# Patient Record
Sex: Female | Born: 1975 | Race: Black or African American | Hispanic: No | Smoking: Former smoker
Health system: Southern US, Community
[De-identification: ages and names within clinical notes are randomized; demographics above are authoritative.]

## PROBLEM LIST (undated history)

## (undated) DIAGNOSIS — N289 Disorder of kidney and ureter, unspecified: Secondary | ICD-10-CM

## (undated) DIAGNOSIS — I10 Essential (primary) hypertension: Secondary | ICD-10-CM

## (undated) DIAGNOSIS — K769 Liver disease, unspecified: Secondary | ICD-10-CM

## (undated) DIAGNOSIS — G629 Polyneuropathy, unspecified: Secondary | ICD-10-CM

## (undated) HISTORY — DX: Liver disease, unspecified: K76.9

## (undated) HISTORY — DX: Disorder of kidney and ureter, unspecified: N28.9

## (undated) HISTORY — PX: TOE SURGERY: SHX1073

---

## 1998-11-10 ENCOUNTER — Encounter: Admission: RE | Admit: 1998-11-10 | Discharge: 1998-11-10 | Payer: Self-pay | Admitting: Obstetrics

## 1998-11-15 ENCOUNTER — Encounter: Admission: RE | Admit: 1998-11-15 | Discharge: 1999-02-13 | Payer: Self-pay | Admitting: Obstetrics & Gynecology

## 1998-11-16 ENCOUNTER — Encounter: Admission: RE | Admit: 1998-11-16 | Discharge: 1998-11-16 | Payer: Self-pay | Admitting: Obstetrics & Gynecology

## 1998-11-16 ENCOUNTER — Other Ambulatory Visit: Admission: RE | Admit: 1998-11-16 | Discharge: 1998-11-16 | Payer: Self-pay | Admitting: Obstetrics & Gynecology

## 1998-11-18 ENCOUNTER — Ambulatory Visit (HOSPITAL_COMMUNITY): Admission: RE | Admit: 1998-11-18 | Discharge: 1998-11-18 | Payer: Self-pay | Admitting: *Deleted

## 1998-11-19 ENCOUNTER — Encounter: Payer: Self-pay | Admitting: Obstetrics & Gynecology

## 1998-11-19 ENCOUNTER — Inpatient Hospital Stay (HOSPITAL_COMMUNITY): Admission: AD | Admit: 1998-11-19 | Discharge: 1998-11-22 | Payer: Self-pay | Admitting: *Deleted

## 1998-11-25 ENCOUNTER — Inpatient Hospital Stay (HOSPITAL_COMMUNITY): Admission: AD | Admit: 1998-11-25 | Discharge: 1998-11-28 | Payer: Self-pay | Admitting: Obstetrics

## 1998-12-21 ENCOUNTER — Encounter: Admission: RE | Admit: 1998-12-21 | Discharge: 1998-12-21 | Payer: Self-pay | Admitting: Obstetrics & Gynecology

## 1998-12-28 ENCOUNTER — Encounter: Admission: RE | Admit: 1998-12-28 | Discharge: 1998-12-28 | Payer: Self-pay | Admitting: Obstetrics & Gynecology

## 1999-01-11 ENCOUNTER — Encounter: Admission: RE | Admit: 1999-01-11 | Discharge: 1999-01-11 | Payer: Self-pay | Admitting: Obstetrics & Gynecology

## 1999-01-18 ENCOUNTER — Encounter: Admission: RE | Admit: 1999-01-18 | Discharge: 1999-01-18 | Payer: Self-pay | Admitting: Obstetrics & Gynecology

## 1999-01-25 ENCOUNTER — Encounter: Admission: RE | Admit: 1999-01-25 | Discharge: 1999-01-25 | Payer: Self-pay | Admitting: Obstetrics & Gynecology

## 1999-01-26 ENCOUNTER — Ambulatory Visit (HOSPITAL_COMMUNITY): Admission: RE | Admit: 1999-01-26 | Discharge: 1999-01-26 | Payer: Self-pay | Admitting: Obstetrics & Gynecology

## 2002-04-07 ENCOUNTER — Emergency Department (HOSPITAL_COMMUNITY): Admission: EM | Admit: 2002-04-07 | Discharge: 2002-04-07 | Payer: Self-pay | Admitting: *Deleted

## 2004-11-09 ENCOUNTER — Emergency Department (HOSPITAL_COMMUNITY): Admission: EM | Admit: 2004-11-09 | Discharge: 2004-11-09 | Payer: Self-pay | Admitting: Emergency Medicine

## 2005-07-20 ENCOUNTER — Emergency Department (HOSPITAL_COMMUNITY): Admission: EM | Admit: 2005-07-20 | Discharge: 2005-07-20 | Payer: Self-pay | Admitting: Emergency Medicine

## 2006-12-25 ENCOUNTER — Inpatient Hospital Stay (HOSPITAL_COMMUNITY): Admission: EM | Admit: 2006-12-25 | Discharge: 2006-12-28 | Payer: Self-pay | Admitting: Emergency Medicine

## 2007-01-21 ENCOUNTER — Emergency Department (HOSPITAL_COMMUNITY): Admission: EM | Admit: 2007-01-21 | Discharge: 2007-01-21 | Payer: Self-pay | Admitting: Emergency Medicine

## 2008-11-26 ENCOUNTER — Emergency Department (HOSPITAL_COMMUNITY): Admission: EM | Admit: 2008-11-26 | Discharge: 2008-11-26 | Payer: Self-pay | Admitting: Emergency Medicine

## 2009-07-10 ENCOUNTER — Emergency Department (HOSPITAL_COMMUNITY): Admission: EM | Admit: 2009-07-10 | Discharge: 2009-07-10 | Payer: Self-pay | Admitting: Emergency Medicine

## 2010-04-08 ENCOUNTER — Emergency Department (HOSPITAL_COMMUNITY)
Admission: EM | Admit: 2010-04-08 | Discharge: 2010-04-09 | Payer: Self-pay | Source: Home / Self Care | Admitting: Emergency Medicine

## 2010-08-23 LAB — CBC
Hemoglobin: 11.4 g/dL — ABNORMAL LOW (ref 12.0–15.0)
MCH: 28.5 pg (ref 26.0–34.0)
MCHC: 33.9 g/dL (ref 30.0–36.0)
MCV: 84 fL (ref 78.0–100.0)
RBC: 4 MIL/uL (ref 3.87–5.11)

## 2010-08-23 LAB — URINALYSIS, ROUTINE W REFLEX MICROSCOPIC
Glucose, UA: >1000 mg/dL — AB
Specific Gravity, Urine: 1.025 (ref 1.005–1.030)
Urobilinogen, UA: 1 mg/dL (ref 0.0–1.0)

## 2010-08-23 LAB — DIFFERENTIAL
Basophils Relative: 0 % (ref 0–1)
Eosinophils Absolute: 0.2 10*3/uL (ref 0.0–0.7)
Eosinophils Relative: 2 % (ref 0–5)
Lymphs Abs: 1.7 10*3/uL (ref 0.7–4.0)
Monocytes Relative: 5 % (ref 3–12)
Neutrophils Relative %: 75 % (ref 43–77)

## 2010-08-23 LAB — GLUCOSE, CAPILLARY: Glucose-Capillary: 470 mg/dL — ABNORMAL HIGH (ref 70–99)

## 2010-08-23 LAB — POCT I-STAT, CHEM 8
Calcium, Ion: 1.14 mmol/L (ref 1.12–1.32)
Creatinine, Ser: 0.9 mg/dL (ref 0.4–1.2)
Glucose, Bld: 514 mg/dL — ABNORMAL HIGH (ref 70–99)
HCT: 38 % (ref 36.0–46.0)
Hemoglobin: 12.9 g/dL (ref 12.0–15.0)
Potassium: 4.6 mEq/L (ref 3.5–5.1)
TCO2: 28 mmol/L (ref 0–100)

## 2010-08-23 LAB — URINE MICROSCOPIC-ADD ON

## 2010-08-27 LAB — URINALYSIS, ROUTINE W REFLEX MICROSCOPIC
Bilirubin Urine: NEGATIVE
Glucose, UA: >1000 mg/dL — AB
Nitrite: POSITIVE — AB
Specific Gravity, Urine: 1.025 (ref 1.005–1.030)
pH: 6 (ref 5.0–8.0)

## 2010-08-27 LAB — URINE MICROSCOPIC-ADD ON

## 2010-08-27 LAB — GLUCOSE, CAPILLARY: Glucose-Capillary: 360 mg/dL — ABNORMAL HIGH (ref 70–99)

## 2010-08-27 LAB — URINE CULTURE

## 2010-10-24 NOTE — H&P (Signed)
NAME:  Anne Carney, Anne Carney           ACCOUNT NO.:  [id number]   MEDICAL RECORD NO.:  [id number]          PATIENT TYPE:  INP   LOCATION:  A226                          FACILITY:  [facility]   PHYSICIAN:  Marcello Moores, MD   DATE OF BIRTH:  [birth year]   DATE OF ADMISSION:  12/25/2006  DATE OF DISCHARGE:  LH                              HISTORY & PHYSICAL   PMD:  Unassigned.   CHIEF COMPLAINTS:  Blurred vision, generalized weakness and polyuria.   HISTORY OF PRESENT ILLNESS:  Ms. Anne Carney is a 35 year old female  patient with history of diabetes mellitus and borderline hypertension.  She presents to the emergency room with the above complaint.  As per the  patient, she stated that she ran out of Lantus for the last 1 week and  she did not take her Lantus but she continued to take her metformin, and  she started to have weakness and visual disturbance and tinnitus with  also increased urine frequency.  Associated with that she has associated  anterior lower chest pain, more of epigastric pain as well.  She denied  any fever.  She denied any cough.  She denied any shortness of breath.  She did not have any GI complaints rather than the above pain.  She was  diabetic for the last 15 years and she was following on [facility]  health department.   REVIEW OF SYSTEMS:  A 10-point review of system is noncontributory  except as dictated on the HPI.   ALLERGIES:  She has not any known allergies.   SOCIAL HISTORY:  She has two children and lives with her children and  currently she is working.  Denies smoking and alcohol or drug abuse.   PAST MEDICAL HISTORY:  1. Diabetes mellitus on medication, metformin and Lantus.  2. Borderline hypertension, not on any medication.   HOME MEDICATIONS:  1. Metformin 500 mg twice a day.  2. Lantus 50 units at bedtime.   PHYSICAL EXAMINATION:  The patient is lying in the bed without any  cardiorespiratory distress.  VITAL SIGNS:  Blood pressure was 140/77,  temperature 98 and pulse rate  is 100 and respiratory rate is 20 and saturation is 98%.  HEENT:  Pink conjunctivae.  Nonicteric sclera.  Pupils are equal and  reactive.  Neck is supple.  CHEST:  Good air entry bilaterally.  CVS:  S1-S2 regular, were heard.  ABDOMEN:  Obese, soft.  No area of tenderness.  Normoactive bowel  sounds.  EXTREMITIES:  No pedal edema.  Peripheral pulses positive.  CNS:  She is alert and well-oriented.  There is not any neurological  deficit.   On the labs, hematology:  CBC is 11.6, hemoglobin is 12.9, hematocrit is  38, platelet count is 149.  Lipase 17.  On the chemistry, sodium is 128,  potassium 4.5, bicarb 28, glucose 601.  Urinalysis showed glucose  greater than 1000.  Otherwise it is negative.  Chest x-ray was done,  which was normal.   ASSESSMENT:  Uncontrolled diabetes mellitus secondary to nonadherence to  medications.  The patient was not taking Lantus for the last several  days and after that she developed the signs and symptoms of her  presentation and in the ER the blood sugar was 600, and the patient was  put on insulin drip with Glucommander.  The patient will be admitted to  2-A and will continue on insulin drip and will monitor with Glucommander  and will monitor also her chemistry.  Currently we do not have BUN and  creatinine, and we will follow it.  Tomorrow will send hemoglobin A1c as  well and when her glucose is controlled, we will consider discharging  her with strong advice not to discontinue her medications.  Briefly she  was indicated about the importance of adherence to her medications and  the complication of uncontrolled diabetes mellitus.      Marcello Moores, MD  Electronically Signed     MT/MEDQ  D:  12/25/2006  T:  12/26/2006  Job:  [id number]

## 2010-10-24 NOTE — Discharge Summary (Signed)
NAME:  Anne Carney, Anne Carney NO.:  [id number]   MEDICAL RECORD NO.:  [id number]          PATIENT TYPE:  INP   LOCATION:  A226                          FACILITY:  [facility]   PHYSICIAN:  Marcello Moores, MD   DATE OF BIRTH:  [birth year]   DATE OF ADMISSION:  12/25/2006  DATE OF DISCHARGE:  07/19/2008LH                               DISCHARGE SUMMARY   PRIMARY CARE PHYSICIAN:  She follows in the [facility] Department of  Health.   DISCHARGE DIAGNOSES:  1. Uncontrolled diabetes mellitus secondary to nonadherence, resolved.  2. Borderline hypertension.   HOME MEDICATIONS:  1. Lantus 50 units subcutaneously at p.m.  2. Metformin 500 mg twice a day.   HOSPITAL COURSE:  [name] is a 35 year old female patient with  history of diabetes mellitus, type 1, on insulin and metformin for  several years. She came with generalized weakness and polyuria with  blurred vision to the emergency room after she was not taking insulin  for 1 week because she ran out of it, and she could not refill it.  Her  blood sugar level in the emergency room was in the 600s, and she was  admitted with insulin drip to the floor for better control.  Currently,  her blood sugar is fairly controlled, and she is ready to go home.   Today, her vital signs show blood pressure is 116/70, temperature 98,  and pulse 81, respiratory rate 20. She is saturating 99% on room air.  Her CBG was 241, 91, 206.  Her blood glucose level in the chemistry  today was 77.  Otherwise, her hematology as well as her chemistry is  within normal range.  Sodium 140, potassium 4, chloride 104, bicarb 30,  glucose 77, BUN 4, creatinine 0.6, and calcium is 8.5.  Her cardiac  enzymes were within normal range, and she is stable.   She is ready to go home, and she was advised to have followup in 1 week  with her primary care physician at [facility]..  A prescription for Lantus was given for 1 ampule, and it  was arranged by  the case manager to get her medication from the pharmacy.      Marcello Moores, MD  Electronically Signed     MT/MEDQ  D:  12/28/2006  T:  12/29/2006  Job:  [id number]

## 2010-10-27 ENCOUNTER — Emergency Department (HOSPITAL_COMMUNITY)
Admission: EM | Admit: 2010-10-27 | Discharge: 2010-10-27 | Disposition: A | Discharge status: Home / Self Care | Payer: PRIVATE HEALTH INSURANCE | Attending: Emergency Medicine | Admitting: Emergency Medicine

## 2010-10-27 DIAGNOSIS — W57XXXA Bitten or stung by nonvenomous insect and other nonvenomous arthropods, initial encounter: Secondary | ICD-10-CM | POA: Insufficient documentation

## 2010-10-27 DIAGNOSIS — S40269A Insect bite (nonvenomous) of unspecified shoulder, initial encounter: Secondary | ICD-10-CM | POA: Insufficient documentation

## 2010-10-27 DIAGNOSIS — I10 Essential (primary) hypertension: Secondary | ICD-10-CM | POA: Insufficient documentation

## 2010-10-27 DIAGNOSIS — Z79899 Other long term (current) drug therapy: Secondary | ICD-10-CM | POA: Insufficient documentation

## 2010-10-27 DIAGNOSIS — E119 Type 2 diabetes mellitus without complications: Secondary | ICD-10-CM | POA: Insufficient documentation

## 2011-03-26 LAB — URINE CULTURE: Colony Count: >=100000

## 2011-03-26 LAB — CBC
HCT: 38.1
Hemoglobin: 11.9 — ABNORMAL LOW
Hemoglobin: 12.6
Hemoglobin: 12.9
MCHC: 33.8
MCHC: 34.3
MCHC: 35
MCV: 83.7
MCV: 85
Platelets: 121 — ABNORMAL LOW
Platelets: 149 — ABNORMAL LOW
RBC: 4.07
RBC: 4.48
RDW: 13.8
RDW: 13.9
RDW: 14
WBC: 11.6 — ABNORMAL HIGH

## 2011-03-26 LAB — BASIC METABOLIC PANEL
BUN: 3 — ABNORMAL LOW
CO2: 26
CO2: 30
Calcium: 8.4
Calcium: 8.5
Chloride: 101
Chloride: 107
Creatinine, Ser: 0.69
GFR calc Af Amer: >60
GFR calc Af Amer: >60
GFR calc non Af Amer: >60
Glucose, Bld: 77
Potassium: 3.7
Sodium: 140
Sodium: 141

## 2011-03-26 LAB — DIFFERENTIAL
Basophils Absolute: 0
Basophils Absolute: 0
Basophils Relative: 0
Basophils Relative: 0
Basophils Relative: 0
Eosinophils Absolute: 0
Eosinophils Absolute: 0.1
Eosinophils Relative: 0
Lymphocytes Relative: 26
Lymphocytes Relative: 3 — ABNORMAL LOW
Lymphs Abs: 0.3 — ABNORMAL LOW
Monocytes Absolute: 0.2
Monocytes Absolute: 0.3
Monocytes Absolute: 0.5
Monocytes Relative: 2 — ABNORMAL LOW
Monocytes Relative: 6
Monocytes Relative: 7
Neutro Abs: 11.1 — ABNORMAL HIGH
Neutro Abs: 3.4
Neutro Abs: 4.4
Neutrophils Relative %: 74
Neutrophils Relative %: 96 — ABNORMAL HIGH

## 2011-03-26 LAB — COMPREHENSIVE METABOLIC PANEL WITH GFR
BUN: 10
CO2: 28
Calcium: 9.1
Creatinine, Ser: 0.95
GFR calc Af Amer: >60
GFR calc non Af Amer: >60
Total Protein: 6.9

## 2011-03-26 LAB — COMPREHENSIVE METABOLIC PANEL
ALT: 21
AST: 25
Albumin: 3.4 — ABNORMAL LOW
Alkaline Phosphatase: 136 — ABNORMAL HIGH
Chloride: 92 — ABNORMAL LOW
Glucose, Bld: 601
Potassium: 4.5
Sodium: 128 — ABNORMAL LOW
Total Bilirubin: 0.8

## 2011-03-26 LAB — URINALYSIS, ROUTINE W REFLEX MICROSCOPIC
Glucose, UA: >1000 — AB
Hgb urine dipstick: NEGATIVE
Leukocytes, UA: NEGATIVE
Protein, ur: NEGATIVE
Specific Gravity, Urine: <1.005 — ABNORMAL LOW

## 2011-03-26 LAB — CARDIAC PANEL(CRET KIN+CKTOT+MB+TROPI)
CK, MB: 0.7
Relative Index: INVALID
Total CK: 126
Troponin I: 0.03
Troponin I: 0.03

## 2011-03-26 LAB — URINE MICROSCOPIC-ADD ON

## 2011-03-26 LAB — LIPASE, BLOOD: Lipase: 17

## 2011-04-27 ENCOUNTER — Emergency Department (HOSPITAL_COMMUNITY)
Admission: EM | Admit: 2011-04-27 | Discharge: 2011-04-27 | Disposition: A | Discharge status: Home / Self Care | Payer: PRIVATE HEALTH INSURANCE | Attending: Emergency Medicine | Admitting: Emergency Medicine

## 2011-04-27 DIAGNOSIS — N390 Urinary tract infection, site not specified: Secondary | ICD-10-CM

## 2011-04-27 DIAGNOSIS — E119 Type 2 diabetes mellitus without complications: Secondary | ICD-10-CM | POA: Insufficient documentation

## 2011-04-27 DIAGNOSIS — I10 Essential (primary) hypertension: Secondary | ICD-10-CM | POA: Insufficient documentation

## 2011-04-27 DIAGNOSIS — Z794 Long term (current) use of insulin: Secondary | ICD-10-CM | POA: Insufficient documentation

## 2011-04-27 HISTORY — DX: Essential (primary) hypertension: I10

## 2011-04-27 LAB — URINALYSIS, ROUTINE W REFLEX MICROSCOPIC
Bilirubin Urine: NEGATIVE
Nitrite: POSITIVE — AB
Specific Gravity, Urine: 1.025 (ref 1.005–1.030)
pH: 6 (ref 5.0–8.0)

## 2011-04-27 LAB — PREGNANCY, URINE: Preg Test, Ur: NEGATIVE

## 2011-04-27 LAB — URINE MICROSCOPIC-ADD ON

## 2011-04-27 MED ORDER — NITROFURANTOIN MONOHYD MACRO 100 MG PO CAPS
100.0000 mg | ORAL_CAPSULE | Freq: Once | ORAL | Status: AC
  Administered 2011-04-27: 100 mg via ORAL
  Filled 2011-04-27: qty 1

## 2011-04-27 MED ORDER — NITROFURANTOIN MONOHYD MACRO 100 MG PO CAPS
ORAL_CAPSULE | ORAL | Status: AC
  Filled 2011-04-27: qty 1

## 2011-04-27 MED ORDER — NITROFURANTOIN MONOHYD MACRO 100 MG PO CAPS: 100.0000 mg | ORAL_CAPSULE | Freq: Two times a day (BID) | ORAL | Status: AC

## 2011-04-27 MED ORDER — PHENAZOPYRIDINE HCL 200 MG PO TABS: 200.0000 mg | ORAL_TABLET | Freq: Three times a day (TID) | ORAL | Status: AC

## 2011-04-27 NOTE — ED Notes (Addendum)
Pt presents with pain during urination x 4 days. Pt denies all other urinary symptoms.

## 2011-04-27 NOTE — ED Notes (Signed)
Discharge instructions and teaching given to pt. Awaiting macrobid from [facility] for med admin.

## 2011-04-27 NOTE — ED Provider Notes (Signed)
History     CSN: [id number] Arrival date & time: 04/27/2011  7:34 PM   First MD Initiated Contact with Patient 04/27/11 2057      Chief Complaint  Patient presents with  . Dysuria    (Consider location/radiation/quality/duration/timing/severity/associated sxs/prior treatment) Patient is a 35 y.o. female presenting with dysuria. The history is provided by the patient. No language interpreter was used.  Dysuria  This is a new problem. Episode onset: 4 days ago. The problem occurs every urination. The problem has been gradually worsening. The quality of the pain is described as burning. The pain is moderate. There has been no fever. She is sexually active. Associated symptoms include frequency, urgency and flank pain. Pertinent negatives include no chills, no nausea, no vomiting, no discharge, no hematuria, no hesitancy and no possible pregnancy.    Past Medical History  Diagnosis Date  . Diabetes mellitus   . Hypertension     History reviewed. No pertinent past surgical history.  No family history on file.  History  Substance Use Topics  . Smoking status: Never Smoker   . Smokeless tobacco: Not on file  . Alcohol Use: No    OB History    Grav Para Term Preterm Abortions TAB SAB Ect Mult Living                  Review of Systems  Constitutional: Negative for fever and chills.  Gastrointestinal: Negative for nausea and vomiting.  Genitourinary: Positive for dysuria, urgency, frequency and flank pain. Negative for hesitancy, hematuria, vaginal bleeding, vaginal discharge, menstrual problem and pelvic pain.  All other systems reviewed and are negative.    Allergies  Review of patient's allergies indicates no known allergies.  Home Medications   Current Outpatient Rx  Name Route Sig Dispense Refill  . INSULIN ISOPHANE HUMAN 100 UNIT/ML [place] SUSP Subcutaneous Inject 25-36 Units into the skin 2 (two) times daily. Inject 36 units every morning and 25 units at bedtime       . METFORMIN HCL 500 MG PO TABS Oral Take 500 mg by mouth 2 (two) times daily with a meal.      . PHENAZOPYRIDINE HCL 95 MG PO TABS Oral Take 95 mg by mouth 2 (two) times daily as needed. For potential UTI       BP 147/76  Pulse 104  Temp(Src) 98.5 F (36.9 C) (Oral)  Resp 20  Ht 5\' 4"  (1.626 m)  Wt 205 lb (92.987 kg)  BMI 35.19 kg/m2  SpO2 100%  LMP 03/27/2011  Physical Exam  Vitals reviewed. Constitutional: She is oriented to person, place, and time. She appears well-developed and well-nourished.  HENT:  Head: Normocephalic.  Eyes: EOM are normal.  Neck: Normal range of motion.  Pulmonary/Chest: Effort normal. No respiratory distress.  Abdominal: Soft. Normal appearance. She exhibits no distension. There is tenderness in the suprapubic area. There is no rebound, no guarding and no CVA tenderness.    Musculoskeletal: Normal range of motion.  Neurological: She is alert and oriented to person, place, and time.  Skin: Skin is warm and dry.  Psychiatric: She has a normal mood and affect.    ED Course  Procedures (including critical care time)   Labs Reviewed  PREGNANCY, URINE  URINALYSIS, ROUTINE W REFLEX MICROSCOPIC   No results found.   No diagnosis found.    MDM          Worthy Rancher, PA 04/27/11 2116

## 2011-04-30 LAB — URINE CULTURE

## 2011-05-01 NOTE — ED Notes (Signed)
+   URINE Chart sent to EDP office for review.

## 2011-05-02 NOTE — ED Notes (Signed)
Rx for Ciprofloxacin 250 mg 1 po BID x 3 days #6 written by Rob Hickman need to be called to pharmacy.

## 2011-05-05 NOTE — ED Notes (Signed)
Attempts to contact patient by phone have been unsuccessful. Numbers provided were out of service. Letter sent.

## 2011-06-08 NOTE — ED Notes (Signed)
No response after 30 days . Chart appended and sent to Medical records. 

## 2011-10-14 ENCOUNTER — Emergency Department (HOSPITAL_COMMUNITY): Payer: No Typology Code available for payment source

## 2011-10-14 ENCOUNTER — Encounter (HOSPITAL_COMMUNITY): Payer: Self-pay

## 2011-10-14 ENCOUNTER — Emergency Department (HOSPITAL_COMMUNITY)
Admission: EM | Admit: 2011-10-14 | Discharge: 2011-10-14 | Disposition: A | Discharge status: Home / Self Care | Payer: No Typology Code available for payment source | Attending: Emergency Medicine | Admitting: Emergency Medicine

## 2011-10-14 DIAGNOSIS — R079 Chest pain, unspecified: Secondary | ICD-10-CM | POA: Insufficient documentation

## 2011-10-14 DIAGNOSIS — E119 Type 2 diabetes mellitus without complications: Secondary | ICD-10-CM | POA: Insufficient documentation

## 2011-10-14 DIAGNOSIS — I10 Essential (primary) hypertension: Secondary | ICD-10-CM | POA: Insufficient documentation

## 2011-10-14 LAB — CARDIAC PANEL(CRET KIN+CKTOT+MB+TROPI)
CK, MB: 3.1 ng/mL (ref 0.3–4.0)
Total CK: 322 U/L — ABNORMAL HIGH (ref 7–177)
Troponin I: <0.3 ng/mL (ref <0.30)

## 2011-10-14 LAB — DIFFERENTIAL
Basophils Absolute: 0 10*3/uL (ref 0.0–0.1)
Lymphocytes Relative: 19 % (ref 12–46)
Lymphs Abs: 1.5 10*3/uL (ref 0.7–4.0)
Neutrophils Relative %: 74 % (ref 43–77)

## 2011-10-14 LAB — BASIC METABOLIC PANEL
CO2: 24 mEq/L (ref 19–32)
GFR calc non Af Amer: 88 mL/min — ABNORMAL LOW (ref >90)
Glucose, Bld: 365 mg/dL — ABNORMAL HIGH (ref 70–99)
Potassium: 4.3 mEq/L (ref 3.5–5.1)
Sodium: 133 mEq/L — ABNORMAL LOW (ref 135–145)

## 2011-10-14 LAB — CBC
Platelets: 160 10*3/uL (ref 150–400)
RBC: 4.12 MIL/uL (ref 3.87–5.11)
WBC: 7.5 10*3/uL (ref 4.0–10.5)

## 2011-10-14 MED ORDER — NAPROXEN 375 MG PO TABS: 375.0000 mg | ORAL_TABLET | Freq: Two times a day (BID) | ORAL | Status: DC

## 2011-10-14 MED ORDER — CYCLOBENZAPRINE HCL 10 MG PO TABS: 10.0000 mg | ORAL_TABLET | Freq: Two times a day (BID) | ORAL | Status: AC | PRN

## 2011-10-14 NOTE — ED Notes (Signed)
EDP is in the room with the patient at this time  

## 2011-10-14 NOTE — ED Notes (Signed)
Pt reports left side cp that started yesterday, has been constant, pain worse w/ movement. Sob at times, denies any n/v.

## 2011-10-14 NOTE — ED Provider Notes (Signed)
History   This chart was scribed for Donnetta Hutching, MD by Toya Smothers. The patient was seen in room APA14/APA14. Patient's care was started at 0955.  CSN: [id number]  Arrival date & time 10/14/11  6962  First MD Initiated Contact with Patient 10/14/11 1044     Chief Complaint  Patient presents with  . Chest Pain   The history is provided by the patient. No language interpreter was used.    Anne Carney is a 36 y.o. female who presents to the Emergency Department complaining of gradual onset constant moderate upper left chest pain described as "slight pain" radiating downward left arm to left elbow onset 24 hours ago with associated symptoms of SOB and sweating. Patient has a h/o diabetes diagnosed 22 years ago and hypertension, and a family history of a grandmother having MI and CVA. Patient denies smoking and use of alcohol.  Pt list PCP as Dr Redmond School   Past Medical History  Diagnosis Date  . Diabetes mellitus   . Hypertension    No past surgical history on file.  No family history on file.  History  Substance Use Topics  . Smoking status: Never Smoker   . Smokeless tobacco: Not on file  . Alcohol Use: No   Review of Systems  Constitutional: Negative for fever and chills.  HENT: Negative for rhinorrhea and neck pain.   Eyes: Negative for photophobia and pain.  Respiratory: Positive for shortness of breath. Negative for cough.   Cardiovascular: Negative for chest pain.  Gastrointestinal: Negative for nausea, vomiting, abdominal pain and diarrhea.  Genitourinary: Negative for dysuria.  Musculoskeletal: Negative for back pain.  Skin: Negative for rash.  Neurological: Negative for weakness and headaches.   Allergies  Review of patient's allergies indicates no known allergies.  Home Medications   Current Outpatient Rx  Name Route Sig Dispense Refill  . INSULIN ISOPHANE HUMAN 100 UNIT/ML [place] SUSP Subcutaneous Inject 26-36 Units into the skin 2 (two) times daily. 36  units in the morning and 26 units in the evening    . METFORMIN HCL 500 MG PO TABS Oral Take 500 mg by mouth 2 (two) times daily with a meal.     . PHENAZOPYRIDINE HCL 95 MG PO TABS Oral Take 95 mg by mouth 2 (two) times daily as needed. For potential UTI      BP 144/74  Pulse 87  Temp(Src) 98.5 F (36.9 C) (Oral)  Resp 18  Ht 5\' 4"  (1.626 m)  Wt 210 lb (95.255 kg)  BMI 36.05 kg/m2  SpO2 97%  LMP 09/17/2011  Physical Exam  Nursing note and vitals reviewed. Constitutional: She is oriented to person, place, and time. She appears well-developed and well-nourished.  HENT:  Head: Normocephalic and atraumatic.  Eyes: Conjunctivae and EOM are normal. Pupils are equal, round, and reactive to light.  Neck: Normal range of motion. Neck supple. No tracheal deviation present. No thyromegaly present.  Cardiovascular: Normal rate, regular rhythm and normal heart sounds.  Exam reveals no gallop and no friction rub.   No murmur heard. Pulmonary/Chest: Effort normal and breath sounds normal. No respiratory distress. She has no wheezes. She exhibits tenderness.       tender in left superior anterior chest wall  Abdominal: Soft. Bowel sounds are normal.  Musculoskeletal: Normal range of motion. She exhibits no edema.  Neurological: She is alert and oriented to person, place, and time. No cranial nerve deficit.  Skin: Skin is warm and dry.  Psychiatric: She  has a normal mood and affect. Her behavior is normal.   ED Course  Procedures (including critical care time)  DIAGNOSTIC STUDIES: Oxygen Saturation is 97% on room air, normal by my interpretation.    COORDINATION OF CARE: 10:55am- Discussed diabetic history and ordered blood work and xray.  Labs Reviewed  CBC - Abnormal; Notable for the following:    Hemoglobin 11.4 (*)    HCT 33.9 (*)    All other components within normal limits  BASIC METABOLIC PANEL - Abnormal; Notable for the following:    Sodium 133 (*)    Glucose, Bld 365 (*)     GFR calc non Af Amer 88 (*)    All other components within normal limits  CARDIAC PANEL(CRET KIN+CKTOT+MB+TROPI) - Abnormal; Notable for the following:    Total CK 322 (*)    All other components within normal limits  DIFFERENTIAL   Dg Chest 2 View  10/14/2011  *RADIOLOGY REPORT*  Clinical Data: Chest pain  CHEST - 2 VIEW  Comparison: 12/25/2006  Findings: Lungs clear.  Heart size and pulmonary vascularity normal.  No effusion.  Visualized bones unremarkable.  IMPRESSION: No acute disease  Original Report Authenticated By: Osa Craver, M.D.   No diagnosis found..m  Date: 10/14/2011  Rate: 84  Rhythm: normal sinus rhythm  QRS Axis: normal  Intervals: normal  ST/T Wave abnormalities: normal  Conduction Disutrbances: none  Narrative Interpretation: unremarkable     MDM  Patient is diabetic. Otherwise risk factor profile is low for ACS. EKG and cardiac enzymes negative. We'll follow up with primary care and refer to cardiologist. Rx for Naprosyn and Flexeril given I personally performed the services described in this documentation, which was scribed in my presence. The recorded information has been reviewed and considered.       Donnetta Hutching, MD 10/14/11 [phone]

## 2011-10-14 NOTE — Discharge Instructions (Signed)
Chest Pain (Nonspecific) It is often hard to give a specific diagnosis for the cause of chest pain. There is always a chance that your pain could be related to something serious, such as a heart attack or a blood clot in the lungs. You need to follow up with your caregiver for further evaluation. CAUSES   Heartburn.   Pneumonia or bronchitis.   Anxiety or stress.   Inflammation around your heart (pericarditis) or lung (pleuritis or pleurisy).   A blood clot in the lung.   A collapsed lung (pneumothorax). It can develop suddenly on its own (spontaneous pneumothorax) or from injury (trauma) to the chest.   Shingles infection (herpes zoster virus).  The chest wall is composed of bones, muscles, and cartilage. Any of these can be the source of the pain.  The bones can be bruised by injury.   The muscles or cartilage can be strained by coughing or overwork.   The cartilage can be affected by inflammation and become sore (costochondritis).  DIAGNOSIS  Lab tests or other studies, such as X-rays, electrocardiography, stress testing, or cardiac imaging, may be needed to find the cause of your pain.  TREATMENT   Treatment depends on what may be causing your chest pain. Treatment may include:   Acid blockers for heartburn.   Anti-inflammatory medicine.   Pain medicine for inflammatory conditions.   Antibiotics if an infection is present.   You may be advised to change lifestyle habits. This includes stopping smoking and avoiding alcohol, caffeine, and chocolate.   You may be advised to keep your head raised (elevated) when sleeping. This reduces the chance of acid going backward from your stomach into your esophagus.   Most of the time, nonspecific chest pain will improve within 2 to 3 days with rest and mild pain medicine.  HOME CARE INSTRUCTIONS   If antibiotics were prescribed, take your antibiotics as directed. Finish them even if you start to feel better.   For the next few  days, avoid physical activities that bring on chest pain. Continue physical activities as directed.   Do not smoke.   Avoid drinking alcohol.   Only take over-the-counter or prescription medicine for pain, discomfort, or fever as directed by your caregiver.   Follow your caregiver's suggestions for further testing if your chest pain does not go away.   Keep any follow-up appointments you made. If you do not go to an appointment, you could develop lasting (chronic) problems with pain. If there is any problem keeping an appointment, you must call to reschedule.  SEEK MEDICAL CARE IF:   You think you are having problems from the medicine you are taking. Read your medicine instructions carefully.   Your chest pain does not go away, even after treatment.   You develop a rash with blisters on your chest.  SEEK IMMEDIATE MEDICAL CARE IF:   You have increased chest pain or pain that spreads to your arm, neck, jaw, back, or abdomen.   You develop shortness of breath, an increasing cough, or you are coughing up blood.   You have severe back or abdominal pain, feel nauseous, or vomit.   You develop severe weakness, fainting, or chills.   You have a fever.  THIS IS AN EMERGENCY. Do not wait to see if the pain will go away. Get medical help at once. Call your local emergency services (911 in [place] Do not drive yourself to the hospital. MAKE SURE YOU:   Understand these instructions.     Will watch your condition.   Will get help right away if you are not doing well or get worse.  Document Released: 03/07/2005 Document Revised: 05/17/2011 Document Reviewed: 01/01/2008 [facility] Patient Information 2012 [place], [place].  Tests were normal. Medication for pain and muscle spasm.  Recommend followup your primary care doctor who may refer you to a cardiologist.

## 2011-12-06 ENCOUNTER — Emergency Department (HOSPITAL_COMMUNITY): Payer: PRIVATE HEALTH INSURANCE

## 2011-12-06 ENCOUNTER — Emergency Department (HOSPITAL_COMMUNITY)
Admission: EM | Admit: 2011-12-06 | Discharge: 2011-12-07 | Disposition: A | Discharge status: Home / Self Care | Payer: PRIVATE HEALTH INSURANCE | Attending: Emergency Medicine | Admitting: Emergency Medicine

## 2011-12-06 ENCOUNTER — Other Ambulatory Visit: Payer: Self-pay

## 2011-12-06 ENCOUNTER — Encounter (HOSPITAL_COMMUNITY): Payer: Self-pay | Admitting: Emergency Medicine

## 2011-12-06 DIAGNOSIS — I10 Essential (primary) hypertension: Secondary | ICD-10-CM | POA: Insufficient documentation

## 2011-12-06 DIAGNOSIS — R079 Chest pain, unspecified: Secondary | ICD-10-CM | POA: Insufficient documentation

## 2011-12-06 DIAGNOSIS — Z794 Long term (current) use of insulin: Secondary | ICD-10-CM | POA: Insufficient documentation

## 2011-12-06 DIAGNOSIS — Z79899 Other long term (current) drug therapy: Secondary | ICD-10-CM | POA: Insufficient documentation

## 2011-12-06 DIAGNOSIS — E119 Type 2 diabetes mellitus without complications: Secondary | ICD-10-CM | POA: Insufficient documentation

## 2011-12-06 NOTE — ED Provider Notes (Signed)
History    This chart was scribed for Joya Gaskins, MD, MD by Smitty Pluck. The patient was seen in room RM04 and the patient's care was started at 11:43PM.   CSN: [id number]  Arrival date & time 12/06/11  2338   First MD Initiated Contact with Patient 12/06/11 2341      Chief complaint - chest pain   Patient is a 36 y.o. female presenting with chest pain. The history is provided by the patient.  Chest Pain    Anne Carney is a 36 y.o. female who presents to the Emergency Department complaining of moderate sharp chest pain onset today at 3 PM. Pt reports mild SOB. Denies abdominal pain, nausea, vomiting, diaphoresis, numbness in lower extremeties. Reports being in ED for same symptoms 1 month ago and was diagnosed with muscle spasms. Symptoms have been constant without radiation. Denies hx of blood clots in legs.  Denies known h/o CAD No early h/o MI in her family She reports pain is worse with movement of her left UE She reports heavy lifting at work will aggravate pain but she has no pain with exertion and no weakness/dyspnea on exertion  PCP is Dr. Gerda Diss  Past Medical History  Diagnosis Date  . Diabetes mellitus   . Hypertension     No past surgical history on file.  No family history on file.  History  Substance Use Topics  . Smoking status: Never Smoker   . Smokeless tobacco: Not on file  . Alcohol Use: No    OB History    Grav Para Term Preterm Abortions TAB SAB Ect Mult Living                  Review of Systems  Cardiovascular: Positive for chest pain.  All other systems reviewed and are negative.  10 Systems reviewed and all are negative for acute change except as noted in the HPI.   Allergies  Review of patient's allergies indicates no known allergies.  Home Medications   Current Outpatient Rx  Name Route Sig Dispense Refill  . INSULIN ISOPHANE HUMAN 100 UNIT/ML [place] SUSP Subcutaneous Inject 26-36 Units into the skin 2 (two) times  daily. 36 units in the morning and 26 units in the evening    . METFORMIN HCL 500 MG PO TABS Oral Take 500 mg by mouth 2 (two) times daily with a meal.     . NAPROXEN 375 MG PO TABS Oral Take 1 tablet (375 mg total) by mouth 2 (two) times daily. 20 tablet 0  . PHENAZOPYRIDINE HCL 95 MG PO TABS Oral Take 95 mg by mouth 2 (two) times daily as needed. For potential UTI       BP 140/71  Pulse 90  Resp 20  Ht 5\' 4"  (1.626 m)  Wt 185 lb (83.915 kg)  BMI 31.76 kg/m2  SpO2 99%  LMP 11/05/2011  Physical Exam  Nursing note and vitals reviewed.  CONSTITUTIONAL: Well developed/well nourished HEAD AND FACE: Normocephalic/atraumatic EYES: EOMI/PERRL ENMT: Mucous membranes moist NECK: supple no meningeal signs SPINE:entire spine nontender CV: S1/S2 noted, no murmurs/rubs/gallops noted LUNGS: Lungs are clear to auscultation bilaterally, no apparent distress Chest - tender to palpation in left chest, and pain reproduced with movement of left shoulder ABDOMEN: soft, nontender, no rebound or guarding GU:no cva tenderness NEURO: Pt is awake/alert, moves all extremitiesx4 EXTREMITIES: pulses normal, full ROM, no edema, no calf tenderness SKIN: warm, color normal PSYCH: no abnormalities of mood noted  ED  Course  Procedures  DIAGNOSTIC STUDIES: Oxygen Saturation is 99% on room air, normal by my interpretation.    COORDINATION OF CARE: 11:47PM EDP discuss pt ED treatment with pt.   Pt well appearing, watching TV She insists most of her pain is with movement of left shoulder or palpation of left chest  I doubt ACS/PE/Dissection at this time I did advise f/u with PCP as this is her repeat visit for CP in the ED Discussed strict return precautions    MDM  Nursing notes including past medical history and social history reviewed and considered in documentation xrays reviewed and considered Previous records reviewed and considered - recent ED visit for chest pain    Date: 12/07/2011   Rate: 84  Rhythm: normal sinus rhythm  QRS Axis: normal  Intervals: normal  ST/T Wave abnormalities: normal  Conduction Disutrbances:none  Narrative Interpretation:   Old EKG Reviewed: unchanged    I personally performed the services described in this documentation, which was scribed in my presence. The recorded information has been reviewed and considered.          Joya Gaskins, MD 12/07/11 [phone]

## 2011-12-06 NOTE — ED Notes (Signed)
Patient c/o left sided chest pain that started tonight.  Patient c/o tenderness to touch and when she raises her left arm.

## 2011-12-07 MED ORDER — OXYCODONE-ACETAMINOPHEN 5-325 MG PO TABS
1.0000 | ORAL_TABLET | Freq: Once | ORAL | Status: AC
  Administered 2011-12-07: 1 via ORAL
  Filled 2011-12-07: qty 1

## 2011-12-07 NOTE — Discharge Instructions (Signed)

## 2011-12-07 NOTE — ED Notes (Signed)
Pt ambulatory at discharge with steady gait; Pt educated about MI sx and when to return to ER if needed

## 2012-03-06 ENCOUNTER — Encounter (HOSPITAL_COMMUNITY): Payer: Self-pay | Admitting: Emergency Medicine

## 2012-03-06 ENCOUNTER — Emergency Department (HOSPITAL_COMMUNITY)
Admission: EM | Admit: 2012-03-06 | Discharge: 2012-03-06 | Disposition: A | Discharge status: Home / Self Care | Payer: Self-pay | Attending: Emergency Medicine | Admitting: Emergency Medicine

## 2012-03-06 DIAGNOSIS — R21 Rash and other nonspecific skin eruption: Secondary | ICD-10-CM | POA: Insufficient documentation

## 2012-03-06 DIAGNOSIS — L989 Disorder of the skin and subcutaneous tissue, unspecified: Secondary | ICD-10-CM | POA: Insufficient documentation

## 2012-03-06 DIAGNOSIS — E119 Type 2 diabetes mellitus without complications: Secondary | ICD-10-CM | POA: Insufficient documentation

## 2012-03-06 DIAGNOSIS — Z794 Long term (current) use of insulin: Secondary | ICD-10-CM | POA: Insufficient documentation

## 2012-03-06 DIAGNOSIS — I10 Essential (primary) hypertension: Secondary | ICD-10-CM | POA: Insufficient documentation

## 2012-03-06 MED ORDER — MINOCYCLINE HCL 100 MG PO CAPS: 100.0000 mg | ORAL_CAPSULE | Freq: Two times a day (BID) | ORAL | Status: DC

## 2012-03-06 MED ORDER — PREDNISONE 10 MG PO TABS: 20.0000 mg | ORAL_TABLET | Freq: Every day | ORAL | Status: DC

## 2012-03-06 MED ORDER — PREDNISONE 20 MG PO TABS
60.0000 mg | ORAL_TABLET | Freq: Once | ORAL | Status: AC
  Administered 2012-03-06: 60 mg via ORAL
  Filled 2012-03-06: qty 3

## 2012-03-06 MED ORDER — HYDROCODONE-ACETAMINOPHEN 5-325 MG PO TABS: 1.0000 | ORAL_TABLET | ORAL | Status: DC | PRN

## 2012-03-06 MED ORDER — DOXYCYCLINE HYCLATE 100 MG PO TABS
100.0000 mg | ORAL_TABLET | Freq: Once | ORAL | Status: AC
  Administered 2012-03-06: 100 mg via ORAL
  Filled 2012-03-06: qty 1

## 2012-03-06 NOTE — ED Notes (Signed)
Patient with no complaints at this time. Respirations even and unlabored. Skin warm/dry. Discharge instructions reviewed with patient at this time. Patient given opportunity to voice concerns/ask questions. Patient discharged at this time and left Emergency Department with steady gait.   

## 2012-03-06 NOTE — ED Provider Notes (Signed)
History     CSN: [id number]  Arrival date & time 03/06/12  1752   First MD Initiated Contact with Patient 03/06/12 1931      Chief Complaint  Patient presents with  . 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 Shortly after this she noted some" bumps on her arm, that seemed to be progressing from her arm up to her hand. The patient is also noted a lesion on the middle of her back. She complains of itching. She also has some soreness of the entire left-sided flank.  Patient is a 36 y.o. female presenting with rash. The history is provided by the patient.  Rash  This is a new problem. The current episode started more than 2 days ago. The problem has been gradually worsening. The problem is associated with chemical exposure.    Past Medical History  Diagnosis Date  . Diabetes mellitus   . Hypertension     History reviewed. No pertinent past surgical history.  History reviewed. No pertinent family history.  History  Substance Use Topics  . Smoking status: Never Smoker   . Smokeless tobacco: Not on file  . Alcohol Use: No    OB History    Grav Para Term Preterm Abortions TAB SAB Ect Mult Living                  Review of Systems  Constitutional: Negative for activity change.       All ROS Neg except as noted in HPI  HENT: Negative for nosebleeds and neck pain.   Eyes: Negative for photophobia and discharge.  Respiratory: Negative for cough, shortness of breath and wheezing.   Cardiovascular: Negative for chest pain and palpitations.  Gastrointestinal: Negative for abdominal pain and blood in stool.  Genitourinary: Negative for dysuria, frequency and hematuria.  Musculoskeletal: Negative for back pain and arthralgias.  Skin: Positive for rash.  Neurological: Negative for dizziness, seizures and speech difficulty.    Psychiatric/Behavioral: Negative for hallucinations and confusion.    Allergies  Review of patient's allergies indicates no known allergies.  Home Medications   Current Outpatient Rx  Name Route Sig Dispense Refill  . INSULIN GLARGINE 100 UNIT/ML [place] SOLN Subcutaneous Inject 50 Units into the skin 2 (two) times daily.    Marland Kitchen METFORMIN HCL 500 MG PO TABS Oral Take 500 mg by mouth 2 (two) times daily with a meal.       BP 146/78  Pulse 95  Temp 98.5 F (36.9 C) (Oral)  Resp 20  Ht 5\' 5"  (1.651 m)  Wt 200 lb (90.719 kg)  BMI 33.28 kg/m2  SpO2 100%  LMP 02/21/2012  Physical Exam  Nursing note and vitals reviewed. Constitutional: She is oriented to person, place, and time. She appears well-developed and well-nourished.  Non-toxic appearance.  HENT:  Head: Normocephalic.  Right Ear: Tympanic membrane and external ear normal.  Left Ear: Tympanic membrane and external ear normal.  Eyes: EOM and lids are normal. Pupils are equal, round, and reactive to light.  Neck: Normal range of motion. Neck supple. Carotid bruit is not present.  Cardiovascular: Normal rate, regular rhythm, normal heart sounds, intact distal pulses and normal pulses.   Pulmonary/Chest: Breath sounds normal. No respiratory distress.  Abdominal: Soft. Bowel sounds are normal. There is no tenderness. There is no guarding.  Musculoskeletal: Normal range of motion.  Lymphadenopathy:  Head (right side): No submandibular adenopathy present.       Head (left side): No submandibular adenopathy present.    She has no cervical adenopathy.  Neurological: She is alert and oriented to person, place, and time. She has normal strength. No cranial nerve deficit or sensory deficit.  Skin: Skin is warm and dry.       There are raised red areas on the right forearm, extending to the right wrist. There is one raised red area at the center of the back. Is no drainage, no red streaking, and the areas are not hot.  Psychiatric:  She has a normal mood and affect. Her speech is normal.    ED Course  Procedures (including critical care time)  Labs Reviewed - No data to display No results found.   No diagnosis found.    MDM  I have reviewed nursing notes, vital signs, and all appropriate lab and imaging results for this patient. The patient thinks she was exposed to a new industrial detergent to wash dishes at her job, and after that she began to develop a rash on the forearm extending into the wrist. The area causes a great deal of itching. The patient also has noticed a lesion of the center of her back. The patient has noted some soreness of the entire right flank. There is no areas that indicate suggestion of shingles. There is no rash in the mouth or in the palms. The patient is to reviewed with doxycycline and prednisone. Patient advised to monitor her glucose closely while on the prednisone.       Kathie Dike, [place] 03/06/12 1943

## 2012-03-06 NOTE — ED Notes (Signed)
Scattered, pink, raised, puritic rash over R arm.  Pt. States it "feels like pins".  Began 2days ago.

## 2012-03-06 NOTE — ED Notes (Signed)
Pt c/o rash to the rt arm. Three to four bumps seen on the rt arm x 3 days.

## 2012-03-08 NOTE — ED Provider Notes (Signed)
Medical screening examination/treatment/procedure(s) were performed by non-physician practitioner and as supervising physician I was immediately available for consultation/collaboration.  Diego Delancey L Takeru Bose, MD 03/08/12 1213 

## 2012-10-08 ENCOUNTER — Emergency Department (HOSPITAL_COMMUNITY): Payer: Self-pay

## 2012-10-08 ENCOUNTER — Encounter (HOSPITAL_COMMUNITY): Payer: Self-pay

## 2012-10-08 ENCOUNTER — Emergency Department (HOSPITAL_COMMUNITY)
Admission: EM | Admit: 2012-10-08 | Discharge: 2012-10-08 | Disposition: A | Discharge status: Home / Self Care | Payer: Self-pay | Attending: Emergency Medicine | Admitting: Emergency Medicine

## 2012-10-08 DIAGNOSIS — R0609 Other forms of dyspnea: Secondary | ICD-10-CM | POA: Insufficient documentation

## 2012-10-08 DIAGNOSIS — R059 Cough, unspecified: Secondary | ICD-10-CM | POA: Insufficient documentation

## 2012-10-08 DIAGNOSIS — R0989 Other specified symptoms and signs involving the circulatory and respiratory systems: Secondary | ICD-10-CM | POA: Insufficient documentation

## 2012-10-08 DIAGNOSIS — E119 Type 2 diabetes mellitus without complications: Secondary | ICD-10-CM | POA: Insufficient documentation

## 2012-10-08 DIAGNOSIS — Z79899 Other long term (current) drug therapy: Secondary | ICD-10-CM | POA: Insufficient documentation

## 2012-10-08 DIAGNOSIS — E860 Dehydration: Secondary | ICD-10-CM | POA: Insufficient documentation

## 2012-10-08 DIAGNOSIS — R0789 Other chest pain: Secondary | ICD-10-CM | POA: Insufficient documentation

## 2012-10-08 DIAGNOSIS — R05 Cough: Secondary | ICD-10-CM | POA: Insufficient documentation

## 2012-10-08 DIAGNOSIS — R0600 Dyspnea, unspecified: Secondary | ICD-10-CM

## 2012-10-08 DIAGNOSIS — Z794 Long term (current) use of insulin: Secondary | ICD-10-CM | POA: Insufficient documentation

## 2012-10-08 DIAGNOSIS — I10 Essential (primary) hypertension: Secondary | ICD-10-CM | POA: Insufficient documentation

## 2012-10-08 LAB — COMPREHENSIVE METABOLIC PANEL
ALT: 20 U/L (ref 0–35)
AST: 23 U/L (ref 0–37)
Albumin: 3.3 g/dL — ABNORMAL LOW (ref 3.5–5.2)
Alkaline Phosphatase: 196 U/L — ABNORMAL HIGH (ref 39–117)
Calcium: 9 mg/dL (ref 8.4–10.5)
GFR calc Af Amer: 77 mL/min — ABNORMAL LOW (ref >90)
Potassium: 4.3 mEq/L (ref 3.5–5.1)
Sodium: 133 mEq/L — ABNORMAL LOW (ref 135–145)
Total Protein: 6.9 g/dL (ref 6.0–8.3)

## 2012-10-08 LAB — CBC WITH DIFFERENTIAL/PLATELET
Basophils Absolute: 0 10*3/uL (ref 0.0–0.1)
Basophils Relative: 0 % (ref 0–1)
Eosinophils Absolute: 0.3 10*3/uL (ref 0.0–0.7)
Eosinophils Relative: 3 % (ref 0–5)
Lymphs Abs: 2.7 10*3/uL (ref 0.7–4.0)
MCH: 28.9 pg (ref 26.0–34.0)
MCHC: 35.7 g/dL (ref 30.0–36.0)
MCV: 81 fL (ref 78.0–100.0)
Neutrophils Relative %: 65 % (ref 43–77)
Platelets: 173 10*3/uL (ref 150–400)
RBC: 4.05 MIL/uL (ref 3.87–5.11)
RDW: 13.4 % (ref 11.5–15.5)

## 2012-10-08 MED ORDER — IPRATROPIUM BROMIDE 0.02 % IN SOLN
0.5000 mg | Freq: Once | RESPIRATORY_TRACT | Status: AC
  Administered 2012-10-08: 0.5 mg via RESPIRATORY_TRACT
  Filled 2012-10-08: qty 2.5

## 2012-10-08 MED ORDER — ALBUTEROL SULFATE HFA 108 (90 BASE) MCG/ACT IN AERS
2.0000 | INHALATION_SPRAY | RESPIRATORY_TRACT | Status: DC | PRN
  Administered 2012-10-08: 2 via RESPIRATORY_TRACT
  Filled 2012-10-08: qty 6.7

## 2012-10-08 MED ORDER — AEROCHAMBER Z-STAT PLUS/MEDIUM MISC
1.0000 | Freq: Once | Status: AC
  Administered 2012-10-08: 1

## 2012-10-08 MED ORDER — ALBUTEROL SULFATE (5 MG/ML) 0.5% IN NEBU
5.0000 mg | INHALATION_SOLUTION | Freq: Once | RESPIRATORY_TRACT | Status: AC
  Administered 2012-10-08: 5 mg via RESPIRATORY_TRACT
  Filled 2012-10-08: qty 1

## 2012-10-08 NOTE — ED Notes (Signed)
Pt reporting improvement in SOB.  No distress noted at present time.  Denies needs at present time.

## 2012-10-08 NOTE — ED Provider Notes (Signed)
History     CSN: [id number]  Arrival date & time 10/08/12  1840   First MD Initiated Contact with Patient 10/08/12 1843      Chief Complaint  Patient presents with  . Shortness of Breath    (Consider location/radiation/quality/duration/timing/severity/associated sxs/prior treatment) HPI Patient reports she started feeling short of breath 2 days ago. She denies wheezing. She states she's had a mild dry cough without fever. She states she did have swelling in her feet a few weeks ago but that is gone now. She states she did have chest pain in the center and slightly to the left 3 days ago and it lasted all day long. She describes it as a tightness. She states lifting up things at work made it hurt more. Patient also states she's noted if she walks up steps she gets short of breath. She denies nausea, vomiting, diarrhea, sore throat or rhinorrhea. She states she has used an inhaler in the past.   Patient has had diabetes since she was 37 years old.  PCP [facility]. Has an appt in 6 days.   Past Medical History  Diagnosis Date  . Diabetes mellitus   . Hypertension     History reviewed. No pertinent past surgical history.  No family history on file.  History  Substance Use Topics  . Smoking status: Never Smoker   . Smokeless tobacco: Not on file  . Alcohol Use: No  employed at [employer]  OB History   Grav Para Term Preterm Abortions TAB SAB Ect Mult Living                  Review of Systems  All other systems reviewed and are negative.    Allergies  Review of patient's allergies indicates no known allergies.  Home Medications   Current Outpatient Rx  Name  Route  Sig  Dispense  Refill  . insulin glargine (LANTUS) 100 UNIT/ML injection   Subcutaneous   Inject 50 Units into the skin at bedtime.          Marland Kitchen lisinopril (PRINIVIL,ZESTRIL) 10 MG tablet   Oral   Take 10 mg by mouth daily.         . sitaGLIPtan-metformin (JANUMET) 50-500 MG  per tablet   Oral   Take 1 tablet by mouth 2 (two) times daily with a meal.           BP 134/77  Pulse 107  Temp(Src) 98.6 F (37 C) (Oral)  Resp 20  Ht 5\' 4"  (1.626 m)  Wt 179 lb (81.194 kg)  BMI 30.71 kg/m2  SpO2 100%  LMP 10/05/2012  Vital signs normal except tachycardia   Physical Exam  Nursing note and vitals reviewed. Constitutional: She is oriented to person, place, and time. She appears well-developed and well-nourished.  Non-toxic appearance. She does not appear ill. No distress.  HENT:  Head: Normocephalic and atraumatic.  Right Ear: External ear normal.  Left Ear: External ear normal.  Nose: Nose normal. No mucosal edema or rhinorrhea.  Mouth/Throat: Oropharynx is clear and moist and mucous membranes are normal. No dental abscesses or edematous.  Eyes: Conjunctivae and EOM are normal. Pupils are equal, round, and reactive to light.  Neck: Normal range of motion and full passive range of motion without pain. Neck supple.  Cardiovascular: Normal rate, regular rhythm and normal heart sounds.  Exam reveals no gallop and no friction rub.   No murmur heard. Pulmonary/Chest: Effort normal and breath sounds normal.  No respiratory distress. She has no wheezes. She has no rhonchi. She has no rales. She exhibits no tenderness and no crepitus.    Area of pain noted  Abdominal: Soft. Normal appearance and bowel sounds are normal. She exhibits no distension. There is no tenderness. There is no rebound and no guarding.  Musculoskeletal: Normal range of motion. She exhibits no edema and no tenderness.  Moves all extremities well.   Neurological: She is alert and oriented to person, place, and time. She has normal strength. No cranial nerve deficit.  Skin: Skin is warm, dry and intact. No rash noted. No erythema. No pallor.  Psychiatric: She has a normal mood and affect. Her speech is normal and behavior is normal. Her mood appears not anxious.    ED Course  Procedures  (including critical care time)  Medications  albuterol (PROVENTIL HFA;VENTOLIN HFA) 108 (90 BASE) MCG/ACT inhaler 2 puff (2 puffs Inhalation Given by Other 10/08/12 2050)  albuterol (PROVENTIL) (5 MG/ML) 0.5% nebulizer solution 5 mg (5 mg Nebulization Given 10/08/12 2009)  ipratropium (ATROVENT) nebulizer solution 0.5 mg (0.5 mg Nebulization Given 10/08/12 2009)  aerochamber Z-Stat Plus/medium 1 each (1 each Other Given by Other 10/08/12 2050)   Feels better after nebulizer treatment. Lungs still clear, with improved air movement.  Pt ambulated by staff and her pulse ox remained 100% on RA with HR 110. States sometimes she is aware of her heart beating fast.   Patient's labs are consistent with dehydration. Patient refused IV fluids and states she's able to drink on her own.  Results for orders placed during the hospital encounter of 10/08/12  D-DIMER, QUANTITATIVE      Result Value Range   D-Dimer, Quant 0.37  0.00 - 0.48 ug/mL-FEU  CBC WITH DIFFERENTIAL      Result Value Range   WBC 9.6  4.0 - 10.5 K/uL   RBC 4.05  3.87 - 5.11 MIL/uL   Hemoglobin 11.7 (*) 12.0 - 15.0 g/dL   HCT 16.1 (*) 09.6 - 04.5 %   MCV 81.0  78.0 - 100.0 fL   MCH 28.9  26.0 - 34.0 pg   MCHC 35.7  30.0 - 36.0 g/dL   RDW 40.9  81.1 - 91.4 %   Platelets 173  150 - 400 K/uL   Neutrophils Relative 65  43 - 77 %   Neutro Abs 6.2  1.7 - 7.7 K/uL   Lymphocytes Relative 28  12 - 46 %   Lymphs Abs 2.7  0.7 - 4.0 K/uL   Monocytes Relative 4  3 - 12 %   Monocytes Absolute 0.4  0.1 - 1.0 K/uL   Eosinophils Relative 3  0 - 5 %   Eosinophils Absolute 0.3  0.0 - 0.7 K/uL   Basophils Relative 0  0 - 1 %   Basophils Absolute 0.0  0.0 - 0.1 K/uL  COMPREHENSIVE METABOLIC PANEL      Result Value Range   Sodium 133 (*) 135 - 145 mEq/L   Potassium 4.3  3.5 - 5.1 mEq/L   Chloride 94 (*) 96 - 112 mEq/L   CO2 28  19 - 32 mEq/L   Glucose, Bld 258 (*) 70 - 99 mg/dL   BUN 30 (*) 6 - 23 mg/dL   Creatinine, Ser 7.82  0.50 - 1.10  mg/dL   Calcium 9.0  8.4 - 95.6 mg/dL   Total Protein 6.9  6.0 - 8.3 g/dL   Albumin 3.3 (*) 3.5 - 5.2 g/dL  AST 23  0 - 37 U/L   ALT 20  0 - 35 U/L   Alkaline Phosphatase 196 (*) 39 - 117 U/L   Total Bilirubin 0.3  0.3 - 1.2 mg/dL   GFR calc non Af Amer 66 (*) >90 mL/min   GFR calc Af Amer 77 (*) >90 mL/min  TROPONIN I      Result Value Range   Troponin I <0.30  <0.30 ng/mL   Laboratory interpretation all normal except mild hyponatremia and low chloride and elevated BUN c/w dehydration.    Dg Chest 2 View  10/08/2012  *RADIOLOGY REPORT*  Clinical Data: Shortness of breath  CHEST - 2 VIEW  Comparison: 12/06/2011  Findings: The lungs are clear without focal consolidation, edema, effusion or pneumothorax.  Cardiopericardial silhouette is within normal limits for size.  Imaged bony structures of the thorax are intact.  IMPRESSION: Normal exam.   Original Report Authenticated By: Kennith Center, M.D.     Date: 10/08/2012  Rate: 103  Rhythm: sinus tachycardia  QRS Axis: normal  Intervals: normal  ST/T Wave abnormalities: normal  Conduction Disutrbances:none  Narrative Interpretation:   Old EKG Reviewed: unchanged from 12/06/2011      1. Dyspnea   2. Dehydration     Plan discharge   Devoria Albe, MD, FACEP     MDM  patient presents with dyspnea of a certain etiology. Chest x-ray was normal without pneumonia or congestive heart failure. She had no wheezing on exam but she did improve with a nebulizer treatment. Her d-dimer was within normal limits. She is being sent home with a inhale her and she are has an appointment to see her PCP in 6 days.           Ward Givens, MD 10/08/12 2055

## 2012-10-08 NOTE — ED Notes (Signed)
Pt c/o SOB for the past 2 days.  Reports "slight" cough.  States cough is nonproductive.  Reports chest pain that comes and goes.  Denies any at present.

## 2012-10-08 NOTE — ED Notes (Addendum)
Patient pulse ox was 100 for oxygen and 110 for pulse while ambulated.

## 2012-11-18 ENCOUNTER — Emergency Department (HOSPITAL_COMMUNITY)
Admission: EM | Admit: 2012-11-18 | Discharge: 2012-11-19 | Disposition: A | Discharge status: Home / Self Care | Payer: Self-pay | Attending: Emergency Medicine | Admitting: Emergency Medicine

## 2012-11-18 ENCOUNTER — Encounter (HOSPITAL_COMMUNITY): Payer: Self-pay | Admitting: *Deleted

## 2012-11-18 DIAGNOSIS — E1169 Type 2 diabetes mellitus with other specified complication: Secondary | ICD-10-CM | POA: Insufficient documentation

## 2012-11-18 DIAGNOSIS — I10 Essential (primary) hypertension: Secondary | ICD-10-CM | POA: Insufficient documentation

## 2012-11-18 DIAGNOSIS — E162 Hypoglycemia, unspecified: Secondary | ICD-10-CM

## 2012-11-18 DIAGNOSIS — Z794 Long term (current) use of insulin: Secondary | ICD-10-CM | POA: Insufficient documentation

## 2012-11-18 DIAGNOSIS — Z79899 Other long term (current) drug therapy: Secondary | ICD-10-CM | POA: Insufficient documentation

## 2012-11-18 LAB — GLUCOSE, CAPILLARY

## 2012-11-18 NOTE — ED Notes (Signed)
Pt states that while driving this pm her vision became blurry and had a funny feeling to her lip; pt states she was concerned about her blood sugar so she ate a candy bar; pt states that she is feeling better currently but the blurriness has not completely resolved.

## 2012-11-19 LAB — GLUCOSE, CAPILLARY: Glucose-Capillary: 160 mg/dL — ABNORMAL HIGH (ref 70–99)

## 2012-11-19 NOTE — ED Notes (Signed)
Epic down-please refer to written Nursing Notes.

## 2012-11-29 NOTE — ED Provider Notes (Signed)
History     CSN: [id number]  Arrival date & time 11/18/12  2255   First MD Initiated Contact with Patient 11/18/12 2332      Chief Complaint  Patient presents with  . Hypoglycemia    (Consider location/radiation/quality/duration/timing/severity/associated sxs/prior treatment) HPI Comments: Down time chart  Patient is a 37 y.o. female presenting with hypoglycemia. The history is provided by the patient.  Hypoglycemia   Past Medical History  Diagnosis Date  . Diabetes mellitus   . Hypertension     History reviewed. No pertinent past surgical history.  No family history on file.  History  Substance Use Topics  . Smoking status: Never Smoker   . Smokeless tobacco: Not on file  . Alcohol Use: Yes     Comment: occ.    OB History   Grav Para Term Preterm Abortions TAB SAB Ect Mult Living                  Review of Systems  Allergies  Review of patient's allergies indicates no known allergies.  Home Medications   Current Outpatient Rx  Name  Route  Sig  Dispense  Refill  . insulin glargine (LANTUS) 100 UNIT/ML injection   Subcutaneous   Inject 60 Units into the skin at bedtime.          Marland Kitchen lisinopril (PRINIVIL,ZESTRIL) 10 MG tablet   Oral   Take 10 mg by mouth daily.         . sitaGLIPtan-metformin (JANUMET) 50-500 MG per tablet   Oral   Take 1 tablet by mouth 2 (two) times daily with a meal.           BP 149/86  Pulse 98  Temp(Src) 99.1 F (37.3 C) (Oral)  Resp 20  Ht 5\' 5"  (1.651 m)  Wt 189 lb (85.73 kg)  BMI 31.45 kg/m2  SpO2 98%  LMP 11/09/2012  Physical Exam  ED Course  Procedures (including critical care time)  Labs Reviewed  GLUCOSE, CAPILLARY - Abnormal; Notable for the following:    Glucose-Capillary 160 (*)    All other components within normal limits  GLUCOSE, CAPILLARY   No results found.   No diagnosis found.    MDM  Down time.  chart         Arman Filter, NP 11/29/12 2004

## 2012-12-01 NOTE — ED Provider Notes (Signed)
Medical screening examination/treatment/procedure(s) were performed by non-physician practitioner and as supervising physician I was immediately available for consultation/collaboration.   Dione Booze, MD 12/01/12 [id number]

## 2013-09-09 ENCOUNTER — Emergency Department (HOSPITAL_COMMUNITY)
Admission: EM | Admit: 2013-09-09 | Discharge: 2013-09-09 | Disposition: A | Discharge status: Home / Self Care | Payer: PRIVATE HEALTH INSURANCE | Attending: Emergency Medicine | Admitting: Emergency Medicine

## 2013-09-09 ENCOUNTER — Encounter (HOSPITAL_COMMUNITY): Payer: Self-pay | Admitting: Emergency Medicine

## 2013-09-09 DIAGNOSIS — Z79899 Other long term (current) drug therapy: Secondary | ICD-10-CM | POA: Insufficient documentation

## 2013-09-09 DIAGNOSIS — R110 Nausea: Secondary | ICD-10-CM | POA: Insufficient documentation

## 2013-09-09 DIAGNOSIS — E162 Hypoglycemia, unspecified: Secondary | ICD-10-CM

## 2013-09-09 DIAGNOSIS — E1169 Type 2 diabetes mellitus with other specified complication: Secondary | ICD-10-CM | POA: Insufficient documentation

## 2013-09-09 DIAGNOSIS — R5381 Other malaise: Secondary | ICD-10-CM | POA: Insufficient documentation

## 2013-09-09 DIAGNOSIS — Z794 Long term (current) use of insulin: Secondary | ICD-10-CM | POA: Insufficient documentation

## 2013-09-09 DIAGNOSIS — I10 Essential (primary) hypertension: Secondary | ICD-10-CM | POA: Insufficient documentation

## 2013-09-09 DIAGNOSIS — R5383 Other fatigue: Secondary | ICD-10-CM

## 2013-09-09 LAB — CBC WITH DIFFERENTIAL/PLATELET
BASOS PCT: 0 % (ref 0–1)
Basophils Absolute: 0 10*3/uL (ref 0.0–0.1)
Eosinophils Absolute: 0.1 10*3/uL (ref 0.0–0.7)
Eosinophils Relative: 2 % (ref 0–5)
HCT: 31.2 % — ABNORMAL LOW (ref 36.0–46.0)
HEMOGLOBIN: 10.3 g/dL — AB (ref 12.0–15.0)
Lymphocytes Relative: 13 % (ref 12–46)
Lymphs Abs: 1.1 10*3/uL (ref 0.7–4.0)
MCH: 27.9 pg (ref 26.0–34.0)
MCHC: 33 g/dL (ref 30.0–36.0)
MCV: 84.6 fL (ref 78.0–100.0)
Monocytes Absolute: 0.4 10*3/uL (ref 0.1–1.0)
Monocytes Relative: 5 % (ref 3–12)
Neutro Abs: 6.7 10*3/uL (ref 1.7–7.7)
Neutrophils Relative %: 80 % — ABNORMAL HIGH (ref 43–77)
Platelets: 130 10*3/uL — ABNORMAL LOW (ref 150–400)
RBC: 3.69 MIL/uL — ABNORMAL LOW (ref 3.87–5.11)
RDW: 13.8 % (ref 11.5–15.5)
WBC: 8.3 10*3/uL (ref 4.0–10.5)

## 2013-09-09 LAB — URINALYSIS, ROUTINE W REFLEX MICROSCOPIC
Bilirubin Urine: NEGATIVE
Glucose, UA: NEGATIVE mg/dL
HGB URINE DIPSTICK: NEGATIVE
Ketones, ur: NEGATIVE mg/dL
Nitrite: POSITIVE — AB
PROTEIN: NEGATIVE mg/dL
SPECIFIC GRAVITY, URINE: 1.02 (ref 1.005–1.030)
UROBILINOGEN UA: 0.2 mg/dL (ref 0.0–1.0)
pH: 5.5 (ref 5.0–8.0)

## 2013-09-09 LAB — BASIC METABOLIC PANEL
BUN: 14 mg/dL (ref 6–23)
CO2: 29 mEq/L (ref 19–32)
Calcium: 9 mg/dL (ref 8.4–10.5)
Chloride: 102 mEq/L (ref 96–112)
Creatinine, Ser: 0.81 mg/dL (ref 0.50–1.10)
GFR calc Af Amer: >90 mL/min (ref >90)
GFR calc non Af Amer: >90 mL/min (ref >90)
GLUCOSE: 71 mg/dL (ref 70–99)
POTASSIUM: 3.7 meq/L (ref 3.7–5.3)
Sodium: 141 mEq/L (ref 137–147)

## 2013-09-09 LAB — URINE MICROSCOPIC-ADD ON

## 2013-09-09 LAB — I-STAT TROPONIN, ED: Troponin i, poc: 0 ng/mL (ref 0.00–0.08)

## 2013-09-09 LAB — CBG MONITORING, ED
GLUCOSE-CAPILLARY: 72 mg/dL (ref 70–99)
GLUCOSE-CAPILLARY: 85 mg/dL (ref 70–99)
Glucose-Capillary: 120 mg/dL — ABNORMAL HIGH (ref 70–99)
Glucose-Capillary: 187 mg/dL — ABNORMAL HIGH (ref 70–99)

## 2013-09-09 LAB — POC URINE PREG, ED: Preg Test, Ur: NEGATIVE

## 2013-09-09 MED ORDER — SODIUM CHLORIDE 0.9 % IV BOLUS (SEPSIS)
1000.0000 mL | Freq: Once | INTRAVENOUS | Status: AC
  Administered 2013-09-09: 1000 mL via INTRAVENOUS

## 2013-09-09 NOTE — Care Management Note (Signed)
ED/CM noted patient did not have health insurance and/or PCP listed in the computer.  Patient was given the [place] resource handout with information on the clinics, food pantries, and the handout for new health insurance sign-up.  Patient expressed appreciation for information received. 

## 2013-09-09 NOTE — ED Notes (Signed)
CBG 76 on arrival.

## 2013-09-09 NOTE — ED Notes (Signed)
Pt states generalized weakness, unable to help undress and unable to give much information, stating, "I can't". Pt states glucometer was reading high at home. EMS reports CBG of 94 and had dropped to 52 enroute. Pt became nauseated on arrival.

## 2013-09-09 NOTE — ED Notes (Signed)
Pt comes from home via EMS with c/o generalized weakness. Pt states her glucometer "read high" at home however EMS reports CBG of 94 followed a CBG of 52 during transport. Pt's CBG was 76 in ED. Pt was unable to help change in gown due to her reported weakness. Pt denies any pain.

## 2013-09-09 NOTE — Discharge Instructions (Signed)
Stop evening [employer] for next week and followup with health department next week    Hypoglycemia (Low Blood Sugar) Hypoglycemia is when the glucose (sugar) in your blood is too low. Hypoglycemia can happen for many reasons. It can happen to people with or without diabetes. Hypoglycemia can develop quickly and can be a medical emergency.  CAUSES  Having hypoglycemia does not mean that you will develop diabetes. Different causes include:  Missed or delayed meals or not enough carbohydrates eaten.  Medication overdose. This could be by accident or deliberate. If by accident, your medication may need to be adjusted or changed.  Exercise or increased activity without adjustments in carbohydrates or medications.  A nerve disorder that affects body functions like your heart rate, blood pressure and digestion (autonomic neuropathy).  A condition where the stomach muscles do not function properly (gastroparesis). Therefore, medications may not absorb properly.  The inability to recognize the signs of hypoglycemia (hypoglycemic unawareness).  Absorption of insulin  may be altered.  Alcohol consumption.  Pregnancy/menstrual cycles/postpartum. This may be due to hormones.  Certain kinds of tumors. This is very rare. SYMPTOMS   Sweating.  Hunger.  Dizziness.  Blurred vision.  Drowsiness.  Weakness.  Headache.  Rapid heart beat.  Shakiness.  Nervousness. DIAGNOSIS  Diagnosis is made by monitoring blood glucose in one or all of the following ways:  Fingerstick blood glucose monitoring.  Laboratory results. TREATMENT  If you think your blood glucose is low:  Check your blood glucose, if possible. If it is less than 70 mg/dl, take one of the following:  3-4 glucose tablets.   cup juice (prefer clear like apple).   cup "regular" soda pop.  1 cup milk.  -1 tube of glucose gel.  5-6 hard candies.  Do not over treat because your blood glucose (sugar) will only go  too high.  Wait 15 minutes and recheck your blood glucose. If it is still less than 70 mg/dl (or below your target range), repeat treatment.  Eat a snack if it is more than one hour until your next meal. Sometimes, your blood glucose may go so low that you are unable to treat yourself. You may need someone to help you. You may even pass out or be unable to swallow. This may require you to get an injection of glucagon, which raises the blood glucose. HOME CARE INSTRUCTIONS  Check blood glucose as recommended by your caregiver.  Take medication as prescribed by your caregiver.  Follow your meal plan. Do not skip meals. Eat on time.  If you are going to drink alcohol, drink it only with meals.  Check your blood glucose before driving.  Check your blood glucose before and after exercise. If you exercise longer or different than usual, be sure to check blood glucose more frequently.  Always carry treatment with you. Glucose tablets are the easiest to carry.  Always wear medical alert jewelry or carry some form of identification that states that you have diabetes. This will alert people that you have diabetes. If you have hypoglycemia, they will have a better idea on what to do. SEEK MEDICAL CARE IF:   You are having problems keeping your blood sugar at target range.  You are having frequent episodes of hypoglycemia.  You feel you might be having side effects from your medicines.  You have symptoms of an illness that is not improving after 3-4 days.  You notice a change in vision or a new problem with your vision.  SEEK IMMEDIATE MEDICAL CARE IF:   You are a family member or friend of a person whose blood glucose goes below 70 mg/dl and is accompanied by:  Confusion.  A change in mental status.  The inability to swallow.  Passing out. Document Released: 05/28/2005 Document Revised: 08/20/2011 Document Reviewed: 09/24/2011 [facility] Patient Information 2014 [place], [place].

## 2013-09-09 NOTE — ED Provider Notes (Signed)
CSN: [id number]     Arrival date & time 09/09/13  0706 History   First MD Initiated Contact with Patient 09/09/13 0719     Chief Complaint  Patient presents with  . Hypoglycemia      Patient is a 38 y.o. female presenting with weakness. The history is provided by the patient.  Weakness This is a new problem. Episode onset: this morning. The problem occurs constantly. The problem has not changed since onset.Pertinent negatives include no chest pain, no abdominal pain, no headaches and no shortness of breath. Nothing aggravates the symptoms. Nothing relieves the symptoms.  pt presents from home for generalized weakness On my evaluation, she reports she woke up this morning feeling generalized weakness and that that her glucose was low She reports her glucometer read "high" EMS was called and they reported 29, then later en route it was in the 50s (on arrival to ED it was 9)   She denies any recent changes in insulin She takes insulin and also oral diabetic meds She reports eating well recently and taking meds as prescribed She denies any recent illness No cp/sob/vomiting/diarrhea No HA   Past Medical History  Diagnosis Date  . Diabetes mellitus   . Hypertension    History reviewed. No pertinent past surgical history. No family history on file. History  Substance Use Topics  . Smoking status: Never Smoker   . Smokeless tobacco: Not on file  . Alcohol Use: Yes     Comment: occ.   OB History   Grav Para Term Preterm Abortions TAB SAB Ect Mult Living                 Review of Systems  Constitutional: Negative for fever.  Respiratory: Negative for shortness of breath.   Cardiovascular: Negative for chest pain.  Gastrointestinal: Positive for nausea. Negative for vomiting, abdominal pain and diarrhea.  Genitourinary: Negative for dysuria.  Neurological: Positive for weakness. Negative for headaches.  All other systems reviewed and are negative.      Allergies   Review of patient's allergies indicates no known allergies.  Home Medications   Current Outpatient Rx  Name  Route  Sig  Dispense  Refill  . insulin glargine (LANTUS) 100 UNIT/ML injection   Subcutaneous   Inject 60 Units into the skin at bedtime.          Marland Kitchen lisinopril (PRINIVIL,ZESTRIL) 10 MG tablet   Oral   Take 10 mg by mouth daily.         . sitaGLIPtan-metformin (JANUMET) 50-500 MG per tablet   Oral   Take 1 tablet by mouth 2 (two) times daily with a meal.          BP 151/75  Pulse 87  Temp(Src) 98.1 F (36.7 C) (Oral)  Resp 16  Ht 5' (1.524 m)  Wt 189 lb (85.73 kg)  BMI 36.91 kg/m2  SpO2 99%  LMP 08/26/2013 Physical Exam CONSTITUTIONAL: Well developed/well nourished HEAD: Normocephalic/atraumatic EYES: EOMI/PERRL ENMT: Mucous membranes moist NECK: supple no meningeal signs SPINE:entire spine nontender CV: S1/S2 noted, no murmurs/rubs/gallops noted LUNGS: Lungs are clear to auscultation bilaterally, no apparent distress ABDOMEN: soft, nontender, no rebound or guarding GU:no cva tenderness NEURO: Pt is awake/alert, moves all extremitiesx4 No arm/leg drift.  No facial droop is noted EXTREMITIES: pulses normal, full ROM SKIN: warm, color normal PSYCH: flat affect  ED Course  Procedures   7:39 AM Pt presents for generalized weakness.  Glucose has been very labile, she reports  at home it read high, but was noted to be 52 en route.  She is now taking oral juice.  Will need to monitor glucose closely here in the ED.  No focal weakness is noted on my evaluation but she appears tired.   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[employer], check glucose frequently (she needs to ensure home glucometer  works)  She denies dysuria, urine culture pending BP 137/76  Pulse 91  Temp(Src) 98.1 F (36.7 C) (Oral)  Resp 20  Ht 5' (1.524 m)  Wt 189 lb (85.73 kg)  BMI 36.91 kg/m2  SpO2 100%  LMP 08/26/2013  Labs Review Labs Reviewed  CBC WITH DIFFERENTIAL - Abnormal; Notable for the following:    RBC 3.69 (*)    Hemoglobin 10.3 (*)    HCT 31.2 (*)    Platelets 130 (*)    Neutrophils Relative % 80 (*)    All other components within normal limits  URINALYSIS, ROUTINE W REFLEX MICROSCOPIC - Abnormal; Notable for the following:    Nitrite POSITIVE (*)    Leukocytes, UA SMALL (*)    All other components within normal limits  URINE MICROSCOPIC-ADD ON - Abnormal; Notable for the following:    Bacteria, UA MANY (*)    All other components within normal limits  URINE CULTURE  BASIC METABOLIC PANEL  CBG MONITORING, ED  I-STAT TROPOININ, ED  POC URINE PREG, ED  CBG MONITORING, ED  CBG MONITORING, ED    EKG Interpretation   Date/Time:  Wednesday September 09 2013 07:40:07 EDT Ventricular Rate:  83 PR Interval:  178 QRS Duration: 78 QT Interval:  388 QTC Calculation: 455 R Axis:   9 Text Interpretation:  Normal sinus rhythm Normal ECG When compared with  ECG of 08-Oct-2012 18:50, No significant change was found Confirmed by  Christy Gentles  MD, Elenore Rota ([id number]) on 09/09/2013 7:46:35 AM      MDM   Final diagnoses:  Hypoglycemia    Nursing notes including past medical history and social history reviewed and considered in documentation Previous records reviewed and considered Labs/vital reviewed and considered     Sharyon Cable, MD 09/09/13 1014

## 2013-09-11 LAB — URINE CULTURE

## 2013-09-12 ENCOUNTER — Telehealth (HOSPITAL_BASED_OUTPATIENT_CLINIC_OR_DEPARTMENT_OTHER): Payer: Self-pay | Admitting: Emergency Medicine

## 2013-09-12 NOTE — Telephone Encounter (Signed)
Post ED Visit - Positive Culture Follow-up  Culture report reviewed by antimicrobial stewardship pharmacist: []  Narberth, Pharm.D., BCPS [x]  Heide Guile, Pharm.D., BCPS []  Alycia Rossetti, Pharm.D., BCPS []  Tiger, Pharm.D., BCPS, AAHIVP []  Legrand Como, Pharm.D., BCPS, AAHIVP  Positive urine culture Per Vernie Murders PA-C, no treatment needed and no further patient follow-up is required at this time.  Myrna Blazer 09/12/2013, 10:42 AM

## 2013-11-11 ENCOUNTER — Emergency Department (HOSPITAL_COMMUNITY)
Admission: EM | Admit: 2013-11-11 | Discharge: 2013-11-11 | Disposition: A | Discharge status: Home / Self Care | Payer: Self-pay | Attending: Emergency Medicine | Admitting: Emergency Medicine

## 2013-11-11 ENCOUNTER — Encounter (HOSPITAL_COMMUNITY): Payer: Self-pay | Admitting: Emergency Medicine

## 2013-11-11 DIAGNOSIS — H539 Unspecified visual disturbance: Secondary | ICD-10-CM | POA: Insufficient documentation

## 2013-11-11 DIAGNOSIS — R059 Cough, unspecified: Secondary | ICD-10-CM | POA: Insufficient documentation

## 2013-11-11 DIAGNOSIS — N390 Urinary tract infection, site not specified: Secondary | ICD-10-CM | POA: Insufficient documentation

## 2013-11-11 DIAGNOSIS — R42 Dizziness and giddiness: Secondary | ICD-10-CM | POA: Insufficient documentation

## 2013-11-11 DIAGNOSIS — Z792 Long term (current) use of antibiotics: Secondary | ICD-10-CM | POA: Insufficient documentation

## 2013-11-11 DIAGNOSIS — Z794 Long term (current) use of insulin: Secondary | ICD-10-CM | POA: Insufficient documentation

## 2013-11-11 DIAGNOSIS — R05 Cough: Secondary | ICD-10-CM | POA: Insufficient documentation

## 2013-11-11 DIAGNOSIS — Z79899 Other long term (current) drug therapy: Secondary | ICD-10-CM | POA: Insufficient documentation

## 2013-11-11 DIAGNOSIS — E119 Type 2 diabetes mellitus without complications: Secondary | ICD-10-CM | POA: Insufficient documentation

## 2013-11-11 DIAGNOSIS — I10 Essential (primary) hypertension: Secondary | ICD-10-CM | POA: Insufficient documentation

## 2013-11-11 DIAGNOSIS — R739 Hyperglycemia, unspecified: Secondary | ICD-10-CM

## 2013-11-11 LAB — BASIC METABOLIC PANEL
BUN: 16 mg/dL (ref 6–23)
CALCIUM: 9.3 mg/dL (ref 8.4–10.5)
CO2: 27 mEq/L (ref 19–32)
CREATININE: 0.86 mg/dL (ref 0.50–1.10)
Chloride: 101 mEq/L (ref 96–112)
GFR calc Af Amer: >90 mL/min (ref >90)
GFR, EST NON AFRICAN AMERICAN: 84 mL/min — AB (ref >90)
GLUCOSE: 147 mg/dL — AB (ref 70–99)
Potassium: 4.5 mEq/L (ref 3.7–5.3)
Sodium: 139 mEq/L (ref 137–147)

## 2013-11-11 LAB — URINALYSIS, ROUTINE W REFLEX MICROSCOPIC
Bilirubin Urine: NEGATIVE
Glucose, UA: NEGATIVE mg/dL
HGB URINE DIPSTICK: NEGATIVE
KETONES UR: NEGATIVE mg/dL
Nitrite: POSITIVE — AB
PROTEIN: NEGATIVE mg/dL
Specific Gravity, Urine: 1.015 (ref 1.005–1.030)
Urobilinogen, UA: 1 mg/dL (ref 0.0–1.0)
pH: 6 (ref 5.0–8.0)

## 2013-11-11 LAB — CBC WITH DIFFERENTIAL/PLATELET
BASOS PCT: 0 % (ref 0–1)
Basophils Absolute: 0 10*3/uL (ref 0.0–0.1)
EOS ABS: 0.2 10*3/uL (ref 0.0–0.7)
EOS PCT: 2 % (ref 0–5)
HCT: 32 % — ABNORMAL LOW (ref 36.0–46.0)
Hemoglobin: 10.8 g/dL — ABNORMAL LOW (ref 12.0–15.0)
Lymphocytes Relative: 18 % (ref 12–46)
Lymphs Abs: 1.5 10*3/uL (ref 0.7–4.0)
MCH: 27.5 pg (ref 26.0–34.0)
MCHC: 33.8 g/dL (ref 30.0–36.0)
MCV: 81.4 fL (ref 78.0–100.0)
MONOS PCT: 5 % (ref 3–12)
Monocytes Absolute: 0.4 10*3/uL (ref 0.1–1.0)
Neutro Abs: 6.2 10*3/uL (ref 1.7–7.7)
Neutrophils Relative %: 75 % (ref 43–77)
Platelets: 142 10*3/uL — ABNORMAL LOW (ref 150–400)
RBC: 3.93 MIL/uL (ref 3.87–5.11)
RDW: 13.3 % (ref 11.5–15.5)
WBC: 8.3 10*3/uL (ref 4.0–10.5)

## 2013-11-11 LAB — URINE MICROSCOPIC-ADD ON

## 2013-11-11 MED ORDER — SODIUM CHLORIDE 0.9 % IV BOLUS (SEPSIS)
1000.0000 mL | Freq: Once | INTRAVENOUS | Status: AC
  Administered 2013-11-11: 1000 mL via INTRAVENOUS

## 2013-11-11 MED ORDER — CEPHALEXIN 500 MG PO CAPS: 500.0000 mg | ORAL_CAPSULE | Freq: Four times a day (QID) | ORAL | Status: DC

## 2013-11-11 MED ORDER — CEPHALEXIN 500 MG PO CAPS
500.0000 mg | ORAL_CAPSULE | Freq: Once | ORAL | Status: AC
  Administered 2013-11-11: 500 mg via ORAL
  Filled 2013-11-11: qty 1

## 2013-11-11 MED ORDER — SODIUM CHLORIDE 0.9 % IV SOLN: INTRAVENOUS | Status: DC

## 2013-11-11 NOTE — ED Notes (Signed)
HA for a few weeks, mostly above left eye, no blurred vision, taking Advil for pain with mild relief, blood sugars high last night but 73 this am, hasn't been checking sugars last few weeks

## 2013-11-11 NOTE — ED Provider Notes (Signed)
CSN: [id number]     Arrival date & time 11/11/13  1003 History  This chart was scribed for Anne Blade, MD by Eston Mould, ED Scribe. This patient was seen in room APA12/APA12 and the patient's care was started at 10:52 AM.   Chief Complaint  Patient presents with  . Headache   The history is provided by the patient. No language interpreter was used.   HPI Comments: KALIOPE QUINONEZ is a 38 y.o. female with hx of DM and HTN who presents to the Emergency Department complaining of ongoing HA, L head pain and intermittent blurred vision for the past few weeks. Reports having a CBG of 560 last night and 76 this morning; is still taking her insulin. States she does not measure her glucose levels as she should. Takes Janumet and Lantus for her DM. Dr. Mardene Celeste from [facility] is her PCP; has not been seen recently. Pt states she has not been eating properly as a diabetic although she knows what she should be eating. States she has been having an occasional dry cough and report intermittent dizziness; states she is able to walk. Denies fever, nausea, emesis, and weakness.  Work: [employer]  Past Medical History  Diagnosis Date  . Diabetes mellitus   . Hypertension    History reviewed. No pertinent past surgical history. History reviewed. No pertinent family history. History  Substance Use Topics  . Smoking status: Never Smoker   . Smokeless tobacco: Not on file  . Alcohol Use: Yes     Comment: occ.   OB History   Grav Para Term Preterm Abortions TAB SAB Ect Mult Living                 Review of Systems  Constitutional: Negative for fever.  Eyes: Positive for visual disturbance.  Respiratory: Positive for cough (occasional and dry).   Gastrointestinal: Negative for nausea and vomiting.  Musculoskeletal: Negative for gait problem.  Neurological: Positive for dizziness (intermittent) and headaches. Negative for weakness.  All other systems reviewed and are  negative.  Allergies  Review of patient's allergies indicates no known allergies.  Home Medications   Prior to Admission medications   Medication Sig Start Date End Date Taking? Authorizing Provider  Cinnamon 500 MG TABS Take 1,000 mg by mouth daily.   Yes Historical Provider, MD  insulin glargine (LANTUS) 100 UNIT/ML injection Inject 70 Units into the skin at bedtime.    Yes Historical Provider, MD  lisinopril (PRINIVIL,ZESTRIL) 10 MG tablet Take 10 mg by mouth daily.   Yes Historical Provider, MD  sitaGLIPtan-metformin (JANUMET) 50-500 MG per tablet Take 1 tablet by mouth 2 (two) times daily with a meal.   Yes Historical Provider, MD  cephALEXin (KEFLEX) 500 MG capsule Take 1 capsule (500 mg total) by mouth 4 (four) times daily. 11/11/13   Anne Blade, MD  Vitamin D, Ergocalciferol, (DRISDOL) 50000 UNITS CAPS capsule Take 50,000 Units by mouth every 7 (seven) days. Takes on Thursdays    Historical Provider, MD   BP 124/73  Pulse 91  Temp(Src) 98.7 F (37.1 C) (Oral)  Resp 17  Ht 5\' 6"  (1.676 m)  Wt 206 lb (93.441 kg)  BMI 33.27 kg/m2  SpO2 100%  LMP 10/23/2013  Physical Exam  Nursing note and vitals reviewed. Constitutional: She is oriented to person, place, and time. She appears well-developed and well-nourished.  HENT:  Head: Normocephalic and atraumatic.  Eyes: Conjunctivae and EOM are normal. Pupils are equal, round, and  reactive to light.  Neck: Normal range of motion and phonation normal. Neck supple.  Cardiovascular: Normal rate, regular rhythm and intact distal pulses.   Pulmonary/Chest: Effort normal and breath sounds normal. She exhibits no tenderness.  Abdominal: Soft. She exhibits no distension. There is no tenderness. There is no guarding.  Musculoskeletal: Normal range of motion.  Neurological: She is alert and oriented to person, place, and time. She exhibits normal muscle tone.  Romberg negative. No ataxia.  Skin: Skin is warm and dry.  Psychiatric: She  has a normal mood and affect. Her behavior is normal. Judgment and thought content normal.   ED Course  Procedures DIAGNOSTIC STUDIES: Oxygen Saturation is 100% on RA, normal by my interpretation.    COORDINATION OF CARE: 11:11 AM-Discussed treatment plan which includes labs and IV fluids. Pt agreed to plan.   11:30 AM-Informed pt of labs and encouraged pt to drink plenty of fluids.  I requested dietary consultation, which was done in the ED. The patient reports that she learned about portion control, from the dietitian.   Labs Review Labs Reviewed  CBC WITH DIFFERENTIAL - Abnormal; Notable for the following:    Hemoglobin 10.8 (*)    HCT 32.0 (*)    Platelets 142 (*)    All other components within normal limits  BASIC METABOLIC PANEL - Abnormal; Notable for the following:    Glucose, Bld 147 (*)    GFR calc non Af Amer 84 (*)    All other components within normal limits  URINALYSIS, ROUTINE W REFLEX MICROSCOPIC - Abnormal; Notable for the following:    Nitrite POSITIVE (*)    Leukocytes, UA SMALL (*)    All other components within normal limits  URINE MICROSCOPIC-ADD ON - Abnormal; Notable for the following:    Bacteria, UA MANY (*)    All other components within normal limits  URINE CULTURE   Imaging Review No results found.   EKG Interpretation None     MDM   Final diagnoses:  Hyperglycemia  UTI (lower urinary tract infection)   Evaluation is consistent with UTI, without evidence for pyelonephritis metabolic instability, or serious bacterial infection. Her anion gap is normal   Nursing Notes Reviewed/ Care Coordinated Applicable Imaging Reviewed Interpretation of Laboratory Data incorporated into ED treatment  The patient appears reasonably screened and/or stabilized for discharge and I doubt any other medical condition or other [facility] requiring further screening, evaluation, or treatment in the ED at this time prior to discharge.  Plan: Home Medications-  Keflex; Home Treatments- rest, fluids; return here if the recommended treatment, does not improve the symptoms; Recommended follow up- PCP 1 week   I personally performed the services described in this documentation, which was scribed in my presence. The recorded information has been reviewed and is accurate.     Anne Blade, MD 11/11/13 [phone]

## 2013-11-11 NOTE — Discharge Instructions (Signed)
Hyperglycemia °Hyperglycemia occurs when the glucose (sugar) in your blood is too high. Hyperglycemia can happen for many reasons, but it most often happens to people who do not know they have diabetes or are not managing their diabetes properly.  °CAUSES  °Whether you have diabetes or not, there are other causes of hyperglycemia. Hyperglycemia can occur when you have diabetes, but it can also occur in other situations that you might not be as aware of, such as: °Diabetes °· If you have diabetes and are having problems controlling your blood glucose, hyperglycemia could occur because of some of the following reasons: °· Not following your meal plan. °· Not taking your diabetes medications or not taking it properly. °· Exercising less or doing less activity than you normally do. °· Being sick. °Pre-diabetes °· This cannot be ignored. Before people develop Type 2 diabetes, they almost always have "pre-diabetes." This is when your blood glucose levels are higher than normal, but not yet high enough to be diagnosed as diabetes. Research has shown that some long-term damage to the body, especially the heart and circulatory system, may already be occurring during pre-diabetes. If you take action to manage your blood glucose when you have pre-diabetes, you may delay or prevent Type 2 diabetes from developing. °Stress °· If you have diabetes, you may be "diet" controlled or on oral medications or insulin to control your diabetes. However, you may find that your blood glucose is higher than usual in the hospital whether you have diabetes or not. This is often referred to as "stress hyperglycemia." Stress can elevate your blood glucose. This happens because of hormones put out by the body during times of stress. If stress has been the cause of your high blood glucose, it can be followed regularly by your caregiver. That way he/she can make sure your hyperglycemia does not continue to get worse or progress to  diabetes. °Steroids °· Steroids are medications that act on the infection fighting system (immune system) to block inflammation or infection. One side effect can be a rise in blood glucose. Most people can produce enough extra insulin to allow for this rise, but for those who cannot, steroids make blood glucose levels go even higher. It is not unusual for steroid treatments to "uncover" diabetes that is developing. It is not always possible to determine if the hyperglycemia will go away after the steroids are stopped. A special blood test called an A1c is sometimes done to determine if your blood glucose was elevated before the steroids were started. °SYMPTOMS °· Thirsty. °· Frequent urination. °· Dry mouth. °· Blurred vision. °· Tired or fatigue. °· Weakness. °· Sleepy. °· Tingling in feet or leg. °DIAGNOSIS  °Diagnosis is made by monitoring blood glucose in one or all of the following ways: °· A1c test. This is a chemical found in your blood. °· Fingerstick blood glucose monitoring. °· Laboratory results. °TREATMENT  °First, knowing the cause of the hyperglycemia is important before the hyperglycemia can be treated. Treatment may include, but is not be limited to: °· Education. °· Change or adjustment in medications. °· Change or adjustment in meal plan. °· Treatment for an illness, infection, etc. °· More frequent blood glucose monitoring. °· Change in exercise plan. °· Decreasing or stopping steroids. °· Lifestyle changes. °HOME CARE INSTRUCTIONS  °· Test your blood glucose as directed. °· Exercise regularly. Your caregiver will give you instructions about exercise. Pre-diabetes or diabetes which comes on with stress is helped by exercising. °· Eat wholesome,   balanced meals. Eat often and at regular, fixed times. Your caregiver or nutritionist will give you a meal plan to guide your sugar intake.  Being at an ideal weight is important. If needed, losing as little as 10 to 15 pounds may help improve blood  glucose levels. SEEK MEDICAL CARE IF:   You have questions about medicine, activity, or diet.  You continue to have symptoms (problems such as increased thirst, urination, or weight gain). SEEK IMMEDIATE MEDICAL CARE IF:   You are vomiting or have diarrhea.  Your breath smells fruity.  You are breathing faster or slower.  You are very sleepy or incoherent.  You have numbness, tingling, or pain in your feet or hands.  You have chest pain.  Your symptoms get worse even though you have been following your caregiver's orders.  If you have any other questions or concerns. Document Released: 11/21/2000 Document Revised: 08/20/2011 Document Reviewed: 09/24/2011 [employer] Patient Information 2014 [place], [place].  Urinary Tract Infection Urinary tract infections (UTIs) can develop anywhere along your urinary tract. Your urinary tract is your body's drainage system for removing wastes and extra water. Your urinary tract includes two kidneys, two ureters, a bladder, and a urethra. Your kidneys are a pair of bean-shaped organs. Each kidney is about the size of your fist. They are located below your ribs, one on each side of your spine. CAUSES Infections are caused by microbes, which are microscopic organisms, including fungi, viruses, and bacteria. These organisms are so small that they can only be seen through a microscope. Bacteria are the microbes that most commonly cause UTIs. SYMPTOMS  Symptoms of UTIs may vary by age and gender of the patient and by the location of the infection. Symptoms in young women typically include a frequent and intense urge to urinate and a painful, burning feeling in the bladder or urethra during urination. Older women and men are more likely to be tired, shaky, and weak and have muscle aches and abdominal pain. A fever may mean the infection is in your kidneys. Other symptoms of a kidney infection include pain in your back or sides below the ribs, nausea, and  vomiting. DIAGNOSIS To diagnose a UTI, your caregiver will ask you about your symptoms. Your caregiver also will ask to provide a urine sample. The urine sample will be tested for bacteria and white blood cells. White blood cells are made by your body to help fight infection. TREATMENT  Typically, UTIs can be treated with medication. Because most UTIs are caused by a bacterial infection, they usually can be treated with the use of antibiotics. The choice of antibiotic and length of treatment depend on your symptoms and the type of bacteria causing your infection. HOME CARE INSTRUCTIONS  If you were prescribed antibiotics, take them exactly as your caregiver instructs you. Finish the medication even if you feel better after you have only taken some of the medication.  Drink enough water and fluids to keep your urine clear or pale yellow.  Avoid caffeine, tea, and carbonated beverages. They tend to irritate your bladder.  Empty your bladder often. Avoid holding urine for long periods of time.  Empty your bladder before and after sexual intercourse.  After a bowel movement, women should cleanse from front to back. Use each tissue only once. SEEK MEDICAL CARE IF:   You have back pain.  You develop a fever.  Your symptoms do not begin to resolve within 3 days. SEEK IMMEDIATE MEDICAL CARE IF:   You  have severe back pain or lower abdominal pain.  You develop chills.  You have nausea or vomiting.  You have continued burning or discomfort with urination. MAKE SURE YOU:   Understand these instructions.  Will watch your condition.  Will get help right away if you are not doing well or get worse. Document Released: 03/07/2005 Document Revised: 11/27/2011 Document Reviewed: 07/06/2011 [facility] Patient Information 2014 [place].

## 2013-11-11 NOTE — ED Notes (Signed)
Pt ambulated to bathroom with steady gait. 

## 2013-11-11 NOTE — ED Notes (Signed)
Pt co headache above lt eye x3 weeks, unable to see PCP, no vision loss, denies N/V. Pt also diabetic, 560 last night and 76 this morning.

## 2013-11-14 LAB — URINE CULTURE

## 2013-11-15 ENCOUNTER — Telehealth (HOSPITAL_BASED_OUTPATIENT_CLINIC_OR_DEPARTMENT_OTHER): Payer: Self-pay | Admitting: Emergency Medicine

## 2013-11-15 NOTE — Telephone Encounter (Signed)
Post ED Visit - Positive Culture Follow-up: Successful Patient Follow-Up  Culture assessed and recommendations reviewed by: []  Wes Seward, Pharm.D., BCPS [x]  Heide Guile, Pharm.D., BCPS []  Alycia Rossetti, Pharm.D., BCPS []  [place], Pharm.D., BCPS, AAHIVP []  Legrand Como, Pharm.D., BCPS, AAHIVP  Positive urine culture  []  Patient discharged without antimicrobial prescription and treatment is now indicated [x]  Organism is resistant to prescribed ED discharge antimicrobial []  Patient with positive blood cultures  Changes discussed with ED provider: Clayton Bibles PA-C New antibiotic prescription: Bactrim DS 1 tablet BID x 3 days    [facility] 11/15/2013, 5:21 PM

## 2013-11-15 NOTE — Progress Notes (Signed)
ED Antimicrobial Stewardship Positive Culture Follow Up   Anne Carney is an 38 y.o. female who presented to [facility] on 11/11/2013 with a chief complaint of  Chief Complaint  Patient presents with  . Headache    Recent Results (from the past 720 hour(s))  URINE CULTURE     Status: None   Collection Time    11/11/13 11:05 AM      Result Value Ref Range Status   Specimen Description URINE, CLEAN CATCH   Final   Special Requests NONE   Final   Culture  Setup Time     Final   Value: 11/11/2013 17:16     Performed at [facility]     Final   Value: >=100,000 COLONIES/ML     Performed at [facility]   Culture     Final   Value: KLEBSIELLA PNEUMONIAE     Performed at [facility]   Report Status 11/14/2013 FINAL   Final   Organism ID, Bacteria KLEBSIELLA PNEUMONIAE   Final    [x]  Treated with cephalexin, organism resistant to prescribed antimicrobial []  Patient discharged originally without antimicrobial agent and treatment is now indicated  New antibiotic prescription: Bactrim DS 1 tablet BID x 3 days  ED Provider: Clayton Bibles, PA-C   [facility] 11/15/2013, 9:22 AM Infectious Diseases Pharmacist Phone# [phone]

## 2014-06-30 ENCOUNTER — Encounter (HOSPITAL_COMMUNITY): Payer: Self-pay | Admitting: *Deleted

## 2014-06-30 ENCOUNTER — Emergency Department (HOSPITAL_COMMUNITY)
Admission: EM | Admit: 2014-06-30 | Discharge: 2014-06-30 | Disposition: A | Discharge status: Home / Self Care | Payer: 59 | Attending: Emergency Medicine | Admitting: Emergency Medicine

## 2014-06-30 ENCOUNTER — Emergency Department (HOSPITAL_COMMUNITY): Payer: 59

## 2014-06-30 DIAGNOSIS — R059 Cough, unspecified: Secondary | ICD-10-CM

## 2014-06-30 DIAGNOSIS — Z79899 Other long term (current) drug therapy: Secondary | ICD-10-CM | POA: Insufficient documentation

## 2014-06-30 DIAGNOSIS — I10 Essential (primary) hypertension: Secondary | ICD-10-CM | POA: Insufficient documentation

## 2014-06-30 DIAGNOSIS — J40 Bronchitis, not specified as acute or chronic: Secondary | ICD-10-CM | POA: Diagnosis not present

## 2014-06-30 DIAGNOSIS — Z794 Long term (current) use of insulin: Secondary | ICD-10-CM | POA: Diagnosis not present

## 2014-06-30 DIAGNOSIS — R05 Cough: Secondary | ICD-10-CM

## 2014-06-30 DIAGNOSIS — E119 Type 2 diabetes mellitus without complications: Secondary | ICD-10-CM | POA: Insufficient documentation

## 2014-06-30 LAB — CBG MONITORING, ED: Glucose-Capillary: 316 mg/dL — ABNORMAL HIGH (ref 70–99)

## 2014-06-30 MED ORDER — GUAIFENESIN 100 MG/5ML PO LIQD: 100.0000 mg | ORAL | Status: DC | PRN

## 2014-06-30 MED ORDER — BENZONATATE 100 MG PO CAPS: 100.0000 mg | ORAL_CAPSULE | Freq: Three times a day (TID) | ORAL | Status: DC

## 2014-06-30 NOTE — ED Notes (Signed)
Pt states productive cough at times, tan in color. Symptoms began yesterday. States head has been hurting and made it worse after catching the cold. States coughing causes her head to hurt. CBG 316

## 2014-06-30 NOTE — ED Provider Notes (Signed)
CSN: [id number]     Arrival date & time 06/30/14  1705 History  This chart was scribed for Tanna Furry, MD by Tula Nakayama, ED Scribe. This patient was seen in room APA05/APA05 and the patient's care was started at 7:26 PM.    Chief Complaint  Patient presents with  . Cough   The history is provided by the patient. No language interpreter was used.    HPI Comments: Anne Carney is a 39 y.o. female who presents to the Emergency Department complaining of intermittent productive cough with yellow sputum that started 1 week ago. She states HA with cough as an associated symptom. Pt denies a history of cardiac or lung problems. She does not smoke cigarettes. Pt denies CP, fever, sore throat and ear pain as associated symptoms.  Past Medical History  Diagnosis Date  . Diabetes mellitus   . Hypertension    History reviewed. No pertinent past surgical history. No family history on file. History  Substance Use Topics  . Smoking status: Never Smoker   . Smokeless tobacco: Not on file  . Alcohol Use: Yes     Comment: occ.   OB History    No data available     Review of Systems  Constitutional: Negative for fever, chills, diaphoresis, appetite change and fatigue.  HENT: Negative for ear pain, mouth sores, sore throat and trouble swallowing.   Eyes: Negative for visual disturbance.  Respiratory: Positive for cough. Negative for chest tightness, shortness of breath and wheezing.   Cardiovascular: Negative for chest pain.  Gastrointestinal: Negative for nausea, vomiting, abdominal pain, diarrhea and abdominal distention.  Endocrine: Negative for polydipsia, polyphagia and polyuria.  Genitourinary: Negative for dysuria, frequency and hematuria.  Musculoskeletal: Negative for gait problem.  Skin: Negative for color change, pallor and rash.  Neurological: Positive for headaches. Negative for dizziness, syncope and light-headedness.  Hematological: Does not bruise/bleed easily.   Psychiatric/Behavioral: Negative for behavioral problems and confusion.    Allergies  Review of patient's allergies indicates no known allergies.  Home Medications   Prior to Admission medications   Medication Sig Start Date End Date Taking? Authorizing Provider  ibuprofen (ADVIL,MOTRIN) 200 MG tablet Take 200 mg by mouth every 6 (six) hours as needed for fever, headache or moderate pain.   Yes Historical Provider, MD  insulin glargine (LANTUS) 100 UNIT/ML injection Inject 35 Units into the skin 2 (two) times daily.    Yes Historical Provider, MD  lisinopril (PRINIVIL,ZESTRIL) 10 MG tablet Take 20 mg by mouth daily.    Yes Historical Provider, MD  loratadine (CLARITIN) 10 MG tablet Take 10 mg by mouth daily as needed for allergies (for cough/allergies).   Yes Historical Provider, MD  sitaGLIPtan-metformin (JANUMET) 50-500 MG per tablet Take 1 tablet by mouth 2 (two) times daily with a meal.   Yes Historical Provider, MD  benzonatate (TESSALON) 100 MG capsule Take 1 capsule (100 mg total) by mouth every 8 (eight) hours. 06/30/14   Tanna Furry, MD  cephALEXin (KEFLEX) 500 MG capsule Take 1 capsule (500 mg total) by mouth 4 (four) times daily. Patient not taking: Reported on 06/30/2014 11/11/13   Richarda Blade, MD  guaiFENesin (ROBITUSSIN) 100 MG/5ML liquid Take 5-10 mLs (100-200 mg total) by mouth every 4 (four) hours as needed for cough. 06/30/14   Tanna Furry, MD   BP 128/74 mmHg  Pulse 105  Temp(Src) 98.2 F (36.8 C) (Oral)  Resp 17  Ht 5\' 4"  (1.626 m)  Wt 200 lb (  90.719 kg)  BMI 34.31 kg/m2  SpO2 100%  LMP 06/30/2014 Physical Exam  Constitutional: She is oriented to person, place, and time. She appears well-developed and well-nourished. No distress.  HENT:  Head: Normocephalic.  Eyes: Conjunctivae are normal. Pupils are equal, round, and reactive to light. No scleral icterus.  Neck: Normal range of motion. Neck supple. No thyromegaly present.  Cardiovascular: Normal rate and  regular rhythm.  Exam reveals no gallop and no friction rub.   No murmur heard. Pulmonary/Chest: Effort normal and breath sounds normal. No respiratory distress. She has no wheezes. She has no rales.  Abdominal: Soft. Bowel sounds are normal. She exhibits no distension. There is no tenderness. There is no rebound.  Musculoskeletal: Normal range of motion.  Neurological: She is alert and oriented to person, place, and time.  Skin: Skin is warm and dry. No rash noted.  Psychiatric: She has a normal mood and affect. Her behavior is normal.  Nursing note and vitals reviewed.   ED Course  Procedures (including critical care time) DIAGNOSTIC STUDIES: Oxygen Saturation is 100% on RA, normal by my interpretation.    COORDINATION OF CARE: 7:27 PM Discussed treatment plan and x-ray results with pt and she agreed to plan.    Labs Review Labs Reviewed  CBG MONITORING, ED - Abnormal; Notable for the following:    Glucose-Capillary 316 (*)    All other components within normal limits    Imaging Review Dg Chest 2 View  06/30/2014   CLINICAL DATA:  Productive cough.  Headache.  EXAM: CHEST  2 VIEW  COMPARISON:  10/08/2012  FINDINGS: Low lung volumes are present, causing crowding of the pulmonary vasculature. Cardiac and mediastinal margins appear normal. The lungs appear clear.  IMPRESSION: 1.  No significant abnormality identified.   Electronically Signed   By: Sherryl Barters M.D.   On: 06/30/2014 17:38     EKG Interpretation None      MDM   Final diagnoses:  Bronchitis   Normal exam. Normal x-ray. Not febrile. Normal lung exam. Plan is discharge home symptomatic treatment for bronchitis.  I personally performed the services described in this documentation, which was scribed in my presence. The recorded information has been reviewed and is accurate.    Tanna Furry, MD 06/30/14 [phone]

## 2014-06-30 NOTE — Discharge Instructions (Signed)
Cough, Adult  A cough is a reflex that helps clear your throat and airways. It can help heal the body or may be a reaction to an irritated airway. A cough may only last 2 or 3 weeks (acute) or may last more than 8 weeks (chronic).  CAUSES Acute cough:  Viral or bacterial infections. Chronic cough:  Infections.  Allergies.  Asthma.  Post-nasal drip.  Smoking.  Heartburn or acid reflux.  Some medicines.  Chronic lung problems (COPD).  Cancer. SYMPTOMS   Cough.  Fever.  Chest pain.  Increased breathing rate.  High-pitched whistling sound when breathing (wheezing).  Colored mucus that you cough up (sputum). TREATMENT   A bacterial cough may be treated with antibiotic medicine.  A viral cough must run its course and will not respond to antibiotics.  Your caregiver may recommend other treatments if you have a chronic cough. HOME CARE INSTRUCTIONS   Only take over-the-counter or prescription medicines for pain, discomfort, or fever as directed by your caregiver. Use cough suppressants only as directed by your caregiver.  Use a cold steam vaporizer or humidifier in your bedroom or home to help loosen secretions.  Sleep in a semi-upright position if your cough is worse at night.  Rest as needed.  Stop smoking if you smoke. SEEK IMMEDIATE MEDICAL CARE IF:   You have pus in your sputum.  Your cough starts to worsen.  You cannot control your cough with suppressants and are losing sleep.  You begin coughing up blood.  You have difficulty breathing.  You develop pain which is getting worse or is uncontrolled with medicine.  You have a fever. MAKE SURE YOU:   Understand these instructions.  Will watch your condition.  Will get help right away if you are not doing well or get worse. Document Released: 11/24/2010 Document Revised: 08/20/2011 Document Reviewed: 11/24/2010 ExitCare Patient Information 2015 [employer]. This information is not intended  to replace advice given to you by your health care provider. Make sure you discuss any questions you have with your health care provider.  

## 2014-07-28 ENCOUNTER — Encounter: Payer: Self-pay | Admitting: Internal Medicine

## 2014-08-18 ENCOUNTER — Telehealth: Payer: Self-pay | Admitting: Gastroenterology

## 2014-08-18 NOTE — Telephone Encounter (Signed)
Patient pcp is not listed on pt insurance card, [employer], so pt was rescheduled to later date and I spoke to [facility] [place] and let her know that the insurance company needed to be contacted if the patient wanted another PCP on her card.  We can not see the patient without a referral and it has to come from the physician on the card

## 2014-08-23 ENCOUNTER — Ambulatory Visit: Payer: 59 | Admitting: Gastroenterology

## 2014-09-08 ENCOUNTER — Ambulatory Visit: Payer: 59 | Admitting: Gastroenterology

## 2015-01-29 ENCOUNTER — Encounter (HOSPITAL_COMMUNITY): Payer: Self-pay | Admitting: *Deleted

## 2015-01-29 ENCOUNTER — Emergency Department (HOSPITAL_COMMUNITY)
Admission: EM | Admit: 2015-01-29 | Discharge: 2015-01-30 | Disposition: A | Discharge status: Home / Self Care | Payer: 59 | Attending: Emergency Medicine | Admitting: Emergency Medicine

## 2015-01-29 DIAGNOSIS — Z79899 Other long term (current) drug therapy: Secondary | ICD-10-CM | POA: Insufficient documentation

## 2015-01-29 DIAGNOSIS — H539 Unspecified visual disturbance: Secondary | ICD-10-CM | POA: Insufficient documentation

## 2015-01-29 DIAGNOSIS — I10 Essential (primary) hypertension: Secondary | ICD-10-CM | POA: Insufficient documentation

## 2015-01-29 DIAGNOSIS — R739 Hyperglycemia, unspecified: Secondary | ICD-10-CM

## 2015-01-29 DIAGNOSIS — E1165 Type 2 diabetes mellitus with hyperglycemia: Secondary | ICD-10-CM | POA: Insufficient documentation

## 2015-01-29 DIAGNOSIS — Z794 Long term (current) use of insulin: Secondary | ICD-10-CM | POA: Insufficient documentation

## 2015-01-29 DIAGNOSIS — N3000 Acute cystitis without hematuria: Secondary | ICD-10-CM | POA: Insufficient documentation

## 2015-01-29 LAB — CBC WITH DIFFERENTIAL/PLATELET
BASOS ABS: 0 10*3/uL (ref 0.0–0.1)
Basophils Relative: 0 % (ref 0–1)
EOS ABS: 0.2 10*3/uL (ref 0.0–0.7)
EOS PCT: 3 % (ref 0–5)
HCT: 32.2 % — ABNORMAL LOW (ref 36.0–46.0)
Hemoglobin: 11 g/dL — ABNORMAL LOW (ref 12.0–15.0)
Lymphocytes Relative: 17 % (ref 12–46)
Lymphs Abs: 1.3 10*3/uL (ref 0.7–4.0)
MCH: 27.9 pg (ref 26.0–34.0)
MCHC: 34.2 g/dL (ref 30.0–36.0)
MCV: 81.7 fL (ref 78.0–100.0)
Monocytes Absolute: 0.4 10*3/uL (ref 0.1–1.0)
Monocytes Relative: 6 % (ref 3–12)
Neutro Abs: 5.7 10*3/uL (ref 1.7–7.7)
Neutrophils Relative %: 74 % (ref 43–77)
PLATELETS: 151 10*3/uL (ref 150–400)
RBC: 3.94 MIL/uL (ref 3.87–5.11)
RDW: 13.5 % (ref 11.5–15.5)
WBC: 7.7 10*3/uL (ref 4.0–10.5)

## 2015-01-29 LAB — CBG MONITORING, ED
GLUCOSE-CAPILLARY: 578 mg/dL — AB (ref 65–99)
Glucose-Capillary: 516 mg/dL — ABNORMAL HIGH (ref 65–99)

## 2015-01-29 MED ORDER — SODIUM CHLORIDE 0.9 % IV BOLUS (SEPSIS)
2000.0000 mL | Freq: Once | INTRAVENOUS | Status: AC
  Administered 2015-01-29: 2000 mL via INTRAVENOUS

## 2015-01-29 MED ORDER — INSULIN ASPART 100 UNIT/ML ~~LOC~~ SOLN
10.0000 [IU] | Freq: Once | SUBCUTANEOUS | Status: AC
  Administered 2015-01-29: 10 [IU] via INTRAVENOUS
  Filled 2015-01-29: qty 1

## 2015-01-29 NOTE — ED Notes (Signed)
Pt states she thinks her blood sugar has been high for 2 days. Pt states she has increased urination and some blurry vision for 2 days. Pt states her CBG was reading high around 6pm tonight.

## 2015-01-29 NOTE — ED Provider Notes (Signed)
CSN: [id number]     Arrival date & time 01/29/15  2207 History  This chart was scribed for Jola Schmidt, MD by Irene Pap, ED Scribe. This patient was seen in room APA08/APA08 and patient care was started at 11:27 PM.   Chief Complaint  Patient presents with  . Hyperglycemia   The history is provided by the patient. No language interpreter was used.  HPI Comments: Anne Carney is a 39 y.o. female with a hx of HTN and DM who presents to the Emergency Department complaining of elevated blood sugar onset 2 days ago. Pt reports associated blurriness, increased urination, leg cramps, intermittent dysuria, nausea, and vomiting. She states that she has been taking her medications as normal, last dose being at 6:30 PM. She says that "sometimes I eat right, sometimes I don't." Pt states that her blood sugar was so high when she measured it, that the machine was unable to be read. She states that her normal levels run around the 120s. She states that she also has a tooth that needs to be pulled after she broke it eating a cracker. She denies fever, diarrhea, chest pain, or recent medication changes.  PCP: Everrett Coombe, NP  Past Medical History  Diagnosis Date  . Diabetes mellitus   . Hypertension    History reviewed. No pertinent past surgical history. History reviewed. No pertinent family history. Social History  Substance Use Topics  . Smoking status: Never Smoker   . Smokeless tobacco: None  . Alcohol Use: Yes     Comment: occ.   OB History    No data available     Review of Systems  Constitutional: Negative for fever.  Eyes: Positive for visual disturbance (blurred vision).  Cardiovascular: Negative for chest pain.  Gastrointestinal: Positive for nausea and vomiting. Negative for diarrhea.  Endocrine:       Elevated blood sugar levels  Genitourinary: Positive for dysuria and frequency.  Musculoskeletal: Positive for myalgias.  All other systems reviewed and are  negative.  Allergies  Review of patient's allergies indicates no known allergies.  Home Medications   Prior to Admission medications   Medication Sig Start Date End Date Taking? Authorizing Provider  ibuprofen (ADVIL,MOTRIN) 200 MG tablet Take 200 mg by mouth every 6 (six) hours as needed for fever, headache or moderate pain.    Historical Provider, MD  insulin glargine (LANTUS) 100 UNIT/ML injection Inject 35 Units into the skin 2 (two) times daily.     Historical Provider, MD  lisinopril (PRINIVIL,ZESTRIL) 10 MG tablet Take 20 mg by mouth daily.     Historical Provider, MD  loratadine (CLARITIN) 10 MG tablet Take 10 mg by mouth daily as needed for allergies (for cough/allergies).    Historical Provider, MD  sitaGLIPtan-metformin (JANUMET) 50-500 MG per tablet Take 1 tablet by mouth 2 (two) times daily with a meal.    Historical Provider, MD   BP 124/72 mmHg  Pulse 97  Temp(Src) 98.7 F (37.1 C) (Oral)  Resp 16  Ht 5\' 4"  (1.626 m)  Wt 211 lb (95.709 kg)  BMI 36.20 kg/m2  SpO2 100%  LMP 01/16/2015  Physical Exam  Constitutional: She is oriented to person, place, and time. She appears well-developed and well-nourished.  HENT:  Head: Normocephalic and atraumatic.  Eyes: Conjunctivae and EOM are normal. Pupils are equal, round, and reactive to light.  Neck: Normal range of motion. Neck supple.  Cardiovascular: Normal rate and regular rhythm.   Pulmonary/Chest: Effort normal and breath  sounds normal.  Abdominal: Soft. Bowel sounds are normal.  Musculoskeletal: Normal range of motion.  Neurological: She is alert and oriented to person, place, and time.  Skin: Skin is warm and dry.  Psychiatric: She has a normal mood and affect. Her behavior is normal.  Nursing note and vitals reviewed.   ED Course  Procedures (including critical care time) DIAGNOSTIC STUDIES: Oxygen Saturation is 100% on RA, normal by my interpretation.    COORDINATION OF CARE: 11:33 PM-Discussed treatment  plan which includes labs with pt at bedside and pt agreed to plan.   Labs Review Labs Reviewed  CBC WITH DIFFERENTIAL/PLATELET - Abnormal; Notable for the following:    Hemoglobin 11.0 (*)    HCT 32.2 (*)    All other components within normal limits  COMPREHENSIVE METABOLIC PANEL - Abnormal; Notable for the following:    Sodium 133 (*)    Chloride 96 (*)    Glucose, Bld 438 (*)    BUN 23 (*)    Creatinine, Ser 1.09 (*)    Albumin 3.4 (*)    Alkaline Phosphatase 161 (*)    All other components within normal limits  URINALYSIS, ROUTINE W REFLEX MICROSCOPIC (NOT AT [facility]) - Abnormal; Notable for the following:    Glucose, UA >1000 (*)    Hgb urine dipstick TRACE (*)    Protein, ur 100 (*)    Nitrite POSITIVE (*)    All other components within normal limits  URINE MICROSCOPIC-ADD ON - Abnormal; Notable for the following:    Bacteria, UA MANY (*)    All other components within normal limits  CBG MONITORING, ED - Abnormal; Notable for the following:    Glucose-Capillary 516 (*)    All other components within normal limits  CBG MONITORING, ED - Abnormal; Notable for the following:    Glucose-Capillary 578 (*)    All other components within normal limits  URINE CULTURE  CBG MONITORING, ED    Imaging Review No results found.    EKG Interpretation None      MDM   Final diagnoses:  Hyperglycemia  Acute cystitis without hematuria    Hyperglycemia likely secondary to urinary tract infection. Urine culture sent. Patient be started on Keflex. Blood sugar managed in the emergency department IV insulin and fluids. Blood sugars improving. No signs of diabetic ketoacidosis. Blood sugar 438 on lab work. Discharged home in good condition. Prominent care follow-up. Patient understands to return to the ER for new or worsening symptoms  I personally performed the services described in this documentation, which was scribed in my presence. The recorded information has been reviewed and is  accurate.      Jola Schmidt, MD 01/30/15 [id number]

## 2015-01-30 LAB — URINALYSIS, ROUTINE W REFLEX MICROSCOPIC
Bilirubin Urine: NEGATIVE
Ketones, ur: NEGATIVE mg/dL
LEUKOCYTES UA: NEGATIVE
NITRITE: POSITIVE — AB
PROTEIN: 100 mg/dL — AB
Specific Gravity, Urine: 1.01 (ref 1.005–1.030)
Urobilinogen, UA: 0.2 mg/dL (ref 0.0–1.0)
pH: 5.5 (ref 5.0–8.0)

## 2015-01-30 LAB — COMPREHENSIVE METABOLIC PANEL
ALT: 22 U/L (ref 14–54)
AST: 20 U/L (ref 15–41)
Albumin: 3.4 g/dL — ABNORMAL LOW (ref 3.5–5.0)
Alkaline Phosphatase: 161 U/L — ABNORMAL HIGH (ref 38–126)
Anion gap: 10 (ref 5–15)
BUN: 23 mg/dL — ABNORMAL HIGH (ref 6–20)
CHLORIDE: 96 mmol/L — AB (ref 101–111)
CO2: 27 mmol/L (ref 22–32)
CREATININE: 1.09 mg/dL — AB (ref 0.44–1.00)
Calcium: 9 mg/dL (ref 8.9–10.3)
GFR calc non Af Amer: >60 mL/min (ref >60)
Glucose, Bld: 438 mg/dL — ABNORMAL HIGH (ref 65–99)
Potassium: 4.3 mmol/L (ref 3.5–5.1)
SODIUM: 133 mmol/L — AB (ref 135–145)
Total Bilirubin: 0.5 mg/dL (ref 0.3–1.2)
Total Protein: 6.9 g/dL (ref 6.5–8.1)

## 2015-01-30 LAB — URINE MICROSCOPIC-ADD ON

## 2015-01-30 LAB — CBG MONITORING, ED: GLUCOSE-CAPILLARY: 107 mg/dL — AB (ref 65–99)

## 2015-01-30 MED ORDER — CEPHALEXIN 500 MG PO CAPS
500.0000 mg | ORAL_CAPSULE | Freq: Once | ORAL | Status: AC
  Administered 2015-01-30: 500 mg via ORAL
  Filled 2015-01-30: qty 1

## 2015-01-30 MED ORDER — CEPHALEXIN 500 MG PO CAPS: 500.0000 mg | ORAL_CAPSULE | Freq: Three times a day (TID) | ORAL | Status: DC

## 2015-01-30 MED ORDER — HYDROCODONE-ACETAMINOPHEN 5-325 MG PO TABS
1.0000 | ORAL_TABLET | Freq: Once | ORAL | Status: AC
  Administered 2015-01-30: 1 via ORAL
  Filled 2015-01-30: qty 1

## 2015-01-30 NOTE — Discharge Instructions (Signed)
Hyperglycemia °Hyperglycemia occurs when the glucose (sugar) in your blood is too high. Hyperglycemia can happen for many reasons, but it most often happens to people who do not know they have diabetes or are not managing their diabetes properly.  °CAUSES  °Whether you have diabetes or not, there are other causes of hyperglycemia. Hyperglycemia can occur when you have diabetes, but it can also occur in other situations that you might not be as aware of, such as: °Diabetes °· If you have diabetes and are having problems controlling your blood glucose, hyperglycemia could occur because of some of the following reasons: °· Not following your meal plan. °· Not taking your diabetes medications or not taking it properly. °· Exercising less or doing less activity than you normally do. °· Being sick. °Pre-diabetes °· This cannot be ignored. Before people develop Type 2 diabetes, they almost always have "pre-diabetes." This is when your blood glucose levels are higher than normal, but not yet high enough to be diagnosed as diabetes. Research has shown that some long-term damage to the body, especially the heart and circulatory system, may already be occurring during pre-diabetes. If you take action to manage your blood glucose when you have pre-diabetes, you may delay or prevent Type 2 diabetes from developing. °Stress °· If you have diabetes, you may be "diet" controlled or on oral medications or insulin to control your diabetes. However, you may find that your blood glucose is higher than usual in the hospital whether you have diabetes or not. This is often referred to as "stress hyperglycemia." Stress can elevate your blood glucose. This happens because of hormones put out by the body during times of stress. If stress has been the cause of your high blood glucose, it can be followed regularly by your caregiver. That way he/she can make sure your hyperglycemia does not continue to get worse or progress to  diabetes. °Steroids °· Steroids are medications that act on the infection fighting system (immune system) to block inflammation or infection. One side effect can be a rise in blood glucose. Most people can produce enough extra insulin to allow for this rise, but for those who cannot, steroids make blood glucose levels go even higher. It is not unusual for steroid treatments to "uncover" diabetes that is developing. It is not always possible to determine if the hyperglycemia will go away after the steroids are stopped. A special blood test called an A1c is sometimes done to determine if your blood glucose was elevated before the steroids were started. °SYMPTOMS °· Thirsty. °· Frequent urination. °· Dry mouth. °· Blurred vision. °· Tired or fatigue. °· Weakness. °· Sleepy. °· Tingling in feet or leg. °DIAGNOSIS  °Diagnosis is made by monitoring blood glucose in one or all of the following ways: °· A1c test. This is a chemical found in your blood. °· Fingerstick blood glucose monitoring. °· Laboratory results. °TREATMENT  °First, knowing the cause of the hyperglycemia is important before the hyperglycemia can be treated. Treatment may include, but is not be limited to: °· Education. °· Change or adjustment in medications. °· Change or adjustment in meal plan. °· Treatment for an illness, infection, etc. °· More frequent blood glucose monitoring. °· Change in exercise plan. °· Decreasing or stopping steroids. °· Lifestyle changes. °HOME CARE INSTRUCTIONS  °· Test your blood glucose as directed. °· Exercise regularly. Your caregiver will give you instructions about exercise. Pre-diabetes or diabetes which comes on with stress is helped by exercising. °· Eat wholesome,   balanced meals. Eat often and at regular, fixed times. Your caregiver or nutritionist will give you a meal plan to guide your sugar intake. °· Being at an ideal weight is important. If needed, losing as little as 10 to 15 pounds may help improve blood  glucose levels. °SEEK MEDICAL CARE IF:  °· You have questions about medicine, activity, or diet. °· You continue to have symptoms (problems such as increased thirst, urination, or weight gain). °SEEK IMMEDIATE MEDICAL CARE IF:  °· You are vomiting or have diarrhea. °· Your breath smells fruity. °· You are breathing faster or slower. °· You are very sleepy or incoherent. °· You have numbness, tingling, or pain in your feet or hands. °· You have chest pain. °· Your symptoms get worse even though you have been following your caregiver's orders. °· If you have any other questions or concerns. °Document Released: 11/21/2000 Document Revised: 08/20/2011 Document Reviewed: 09/24/2011 °ExitCare® Patient Information ©2015 [employer]. This information is not intended to replace advice given to you by your health care provider. Make sure you discuss any questions you have with your health care provider. ° °Urinary Tract Infection °Urinary tract infections (UTIs) can develop anywhere along your urinary tract. Your urinary tract is your body's drainage system for removing wastes and extra water. Your urinary tract includes two kidneys, two ureters, a bladder, and a urethra. Your kidneys are a pair of bean-shaped organs. Each kidney is about the size of your fist. They are located below your ribs, one on each side of your spine. °CAUSES °Infections are caused by microbes, which are microscopic organisms, including fungi, viruses, and bacteria. These organisms are so small that they can only be seen through a microscope. Bacteria are the microbes that most commonly cause UTIs. °SYMPTOMS  °Symptoms of UTIs may vary by age and gender of the patient and by the location of the infection. Symptoms in young women typically include a frequent and intense urge to urinate and a painful, burning feeling in the bladder or urethra during urination. Older women and men are more likely to be tired, shaky, and weak and have muscle aches and  abdominal pain. A fever may mean the infection is in your kidneys. Other symptoms of a kidney infection include pain in your back or sides below the ribs, nausea, and vomiting. °DIAGNOSIS °To diagnose a UTI, your caregiver will ask you about your symptoms. Your caregiver also will ask to provide a urine sample. The urine sample will be tested for bacteria and white blood cells. White blood cells are made by your body to help fight infection. °TREATMENT  °Typically, UTIs can be treated with medication. Because most UTIs are caused by a bacterial infection, they usually can be treated with the use of antibiotics. The choice of antibiotic and length of treatment depend on your symptoms and the type of bacteria causing your infection. °HOME CARE INSTRUCTIONS °· If you were prescribed antibiotics, take them exactly as your caregiver instructs you. Finish the medication even if you feel better after you have only taken some of the medication. °· Drink enough water and fluids to keep your urine clear or pale yellow. °· Avoid caffeine, tea, and carbonated beverages. They tend to irritate your bladder. °· Empty your bladder often. Avoid holding urine for long periods of time. °· Empty your bladder before and after sexual intercourse. °· After a bowel movement, women should cleanse from front to back. Use each tissue only once. °SEEK MEDICAL CARE IF:  °·   You have back pain. °· You develop a fever. °· Your symptoms do not begin to resolve within 3 days. °SEEK IMMEDIATE MEDICAL CARE IF:  °· You have severe back pain or lower abdominal pain. °· You develop chills. °· You have nausea or vomiting. °· You have continued burning or discomfort with urination. °MAKE SURE YOU:  °· Understand these instructions. °· Will watch your condition. °· Will get help right away if you are not doing well or get worse. °Document Released: 03/07/2005 Document Revised: 11/27/2011 Document Reviewed: 07/06/2011 °ExitCare® Patient Information ©2015  [employer]. This information is not intended to replace advice given to you by your health care provider. Make sure you discuss any questions you have with your health care provider. ° °

## 2015-02-01 LAB — URINE CULTURE: Culture: >=100000

## 2015-02-02 ENCOUNTER — Telehealth (HOSPITAL_BASED_OUTPATIENT_CLINIC_OR_DEPARTMENT_OTHER): Payer: Self-pay | Admitting: Emergency Medicine

## 2015-02-02 NOTE — Progress Notes (Signed)
ED Antimicrobial Stewardship Positive Culture Follow Up   Anne Carney is an 39 y.o. female who presented to [facility] on 01/29/2015 with a chief complaint of  Chief Complaint  Patient presents with  . Hyperglycemia    Recent Results (from the past 720 hour(s))  Urine culture     Status: None   Collection Time: 01/29/15 11:34 PM  Result Value Ref Range Status   Specimen Description URINE, CLEAN CATCH  Final   Special Requests NONE  Final   Culture   Final    >=100,000 COLONIES/mL KLEBSIELLA PNEUMONIAE Performed at [facility]    Report Status 02/01/2015 FINAL  Final   Organism ID, Bacteria KLEBSIELLA PNEUMONIAE  Final      Susceptibility   Klebsiella pneumoniae - MIC*    AMPICILLIN >=32 RESISTANT Resistant     CEFAZOLIN >=64 RESISTANT Resistant     CEFTRIAXONE 2 SENSITIVE Sensitive     CIPROFLOXACIN <=0.25 SENSITIVE Sensitive     GENTAMICIN <=1 SENSITIVE Sensitive     IMIPENEM 1 SENSITIVE Sensitive     NITROFURANTOIN 128 RESISTANT Resistant     TRIMETH/SULFA <=20 SENSITIVE Sensitive     AMPICILLIN/SULBACTAM >=32 RESISTANT Resistant     PIP/TAZO 32 INTERMEDIATE Intermediate     * >=100,000 COLONIES/mL KLEBSIELLA PNEUMONIAE    [x]  Treated with cephalexin, organism resistant to prescribed antimicrobial  New antibiotic prescription: Stop Keflex, Start Bactrim DS 1 tablet by mouth twice a day for 3 days.  ED Provider: Delos Haring, PA-C   Ricka Burdock 02/02/2015, 8:40 AM Infectious Diseases Pharmacist Phone# [phone]

## 2015-02-02 NOTE — Telephone Encounter (Signed)
Post ED Visit - Positive Culture Follow-up: Successful Patient Follow-Up  Culture assessed and recommendations reviewed by: []  Heide Guile, Pharm.D., BCPS-AQ ID []  Alycia Rossetti, Pharm.D., BCPS []  [place], Pharm.D., BCPS, AAHIVP []  Legrand Como, Pharm.D., BCPS, AAHIVP []  Tegan Magsam, Pharm.D. []  Milus Glazier, Pharm.D. Dimitri Ped PharmD  Positive urine culture Klebsiella  []  Patient discharged without antimicrobial prescription and treatment is now indicated [x]  Organism is resistant to prescribed ED discharge antimicrobial []  Patient with positive blood cultures  Changes discussed with ED provider:Tiffany Carlota Raspberry PA New antibiotic prescription stop keflex, start bactrim DS 1 tablet po bid x 3 days Called to [facility]  Contacted patient, 02/02/15  @ [place]   Hazle Nordmann 02/02/2015, 9:30 AM

## 2015-04-27 ENCOUNTER — Encounter (HOSPITAL_COMMUNITY): Payer: Self-pay

## 2015-04-27 ENCOUNTER — Emergency Department (HOSPITAL_COMMUNITY)
Admission: EM | Admit: 2015-04-27 | Discharge: 2015-04-27 | Disposition: A | Discharge status: Home / Self Care | Payer: 59 | Attending: Emergency Medicine | Admitting: Emergency Medicine

## 2015-04-27 DIAGNOSIS — Z792 Long term (current) use of antibiotics: Secondary | ICD-10-CM | POA: Insufficient documentation

## 2015-04-27 DIAGNOSIS — K0889 Other specified disorders of teeth and supporting structures: Secondary | ICD-10-CM | POA: Insufficient documentation

## 2015-04-27 DIAGNOSIS — Z79899 Other long term (current) drug therapy: Secondary | ICD-10-CM | POA: Insufficient documentation

## 2015-04-27 DIAGNOSIS — K029 Dental caries, unspecified: Secondary | ICD-10-CM | POA: Insufficient documentation

## 2015-04-27 DIAGNOSIS — Z794 Long term (current) use of insulin: Secondary | ICD-10-CM | POA: Insufficient documentation

## 2015-04-27 DIAGNOSIS — I10 Essential (primary) hypertension: Secondary | ICD-10-CM | POA: Insufficient documentation

## 2015-04-27 DIAGNOSIS — E119 Type 2 diabetes mellitus without complications: Secondary | ICD-10-CM | POA: Insufficient documentation

## 2015-04-27 MED ORDER — HYDROCODONE-ACETAMINOPHEN 5-325 MG PO TABS
1.0000 | ORAL_TABLET | Freq: Once | ORAL | Status: AC
  Administered 2015-04-27: 1 via ORAL
  Filled 2015-04-27: qty 1

## 2015-04-27 MED ORDER — TRAMADOL HCL 50 MG PO TABS: 50.0000 mg | ORAL_TABLET | Freq: Four times a day (QID) | ORAL | Status: DC | PRN

## 2015-04-27 MED ORDER — CLINDAMYCIN HCL 150 MG PO CAPS
300.0000 mg | ORAL_CAPSULE | Freq: Once | ORAL | Status: AC
  Administered 2015-04-27: 300 mg via ORAL
  Filled 2015-04-27: qty 2

## 2015-04-27 MED ORDER — CLINDAMYCIN HCL 150 MG PO CAPS: 300.0000 mg | ORAL_CAPSULE | Freq: Four times a day (QID) | ORAL | Status: DC

## 2015-04-27 NOTE — ED Provider Notes (Signed)
CSN: [id number]     Arrival date & time 04/27/15  2120 History   First MD Initiated Contact with Patient 04/27/15 2135     Chief Complaint  Patient presents with  . Dental Pain     (Consider location/radiation/quality/duration/timing/severity/associated sxs/prior Treatment) HPI   Anne Carney is a 39 y.o. female who presents to the Emergency Department complaining of dental pain for 2 weeks. She describes a sharp radiating pain from her left lower tooth up her face and toward her left ear. Pain is worse with chewing or hot or cold foods or fluids. She has tried ibuprofen for pain. She denies fever, chills, facial swelling or difficulty swallowing.   Past Medical History  Diagnosis Date  . Diabetes mellitus   . Hypertension    History reviewed. No pertinent past surgical history. History reviewed. No pertinent family history. Social History  Substance Use Topics  . Smoking status: Never Smoker   . Smokeless tobacco: None  . Alcohol Use: Yes     Comment: occ.   OB History    No data available     Review of Systems  Constitutional: Negative for fever and appetite change.  HENT: Positive for dental problem. Negative for congestion, facial swelling, sore throat and trouble swallowing.   Eyes: Negative for pain and visual disturbance.  Musculoskeletal: Negative for neck pain and neck stiffness.  Neurological: Negative for dizziness, facial asymmetry and headaches.  Hematological: Negative for adenopathy.  All other systems reviewed and are negative.     Allergies  Review of patient's allergies indicates no known allergies.  Home Medications   Prior to Admission medications   Medication Sig Start Date End Date Taking? Authorizing Provider  cephALEXin (KEFLEX) 500 MG capsule Take 1 capsule (500 mg total) by mouth 3 (three) times daily. 01/30/15   Jola Schmidt, MD  ibuprofen (ADVIL,MOTRIN) 200 MG tablet Take 200 mg by mouth every 6 (six) hours as needed for fever,  headache or moderate pain.    Historical Provider, MD  insulin glargine (LANTUS) 100 UNIT/ML injection Inject 35 Units into the skin 2 (two) times daily.     Historical Provider, MD  lisinopril (PRINIVIL,ZESTRIL) 10 MG tablet Take 20 mg by mouth daily.     Historical Provider, MD  loratadine (CLARITIN) 10 MG tablet Take 10 mg by mouth daily as needed for allergies (for cough/allergies).    Historical Provider, MD  sitaGLIPtan-metformin (JANUMET) 50-500 MG per tablet Take 1 tablet by mouth 2 (two) times daily with a meal.    Historical Provider, MD   BP 159/72 mmHg  Pulse 99  Temp(Src) 98.4 F (36.9 C) (Oral)  Resp 18  Ht 5\' 4"  (1.626 m)  Wt 212 lb (96.163 kg)  BMI 36.37 kg/m2  SpO2 100%  LMP 04/13/2015 Physical Exam  Constitutional: She is oriented to person, place, and time. She appears well-developed and well-nourished. No distress.  HENT:  Head: Normocephalic and atraumatic.  Right Ear: Tympanic membrane and ear canal normal.  Left Ear: Tympanic membrane and ear canal normal.  Mouth/Throat: Uvula is midline, oropharynx is clear and moist and mucous membranes are normal. No trismus in the jaw. Dental caries present. No dental abscesses or uvula swelling.  Tenderness and dental caries of the left lower molar.  No facial swelling, obvious dental abscess, trismus, or sublingual abnml.    Neck: Normal range of motion. Neck supple.  Cardiovascular: Normal rate, regular rhythm and normal heart sounds.   No murmur heard. Pulmonary/Chest: Effort normal  and breath sounds normal. No respiratory distress.  Musculoskeletal: Normal range of motion.  Lymphadenopathy:    She has no cervical adenopathy.  Neurological: She is alert and oriented to person, place, and time. She exhibits normal muscle tone. Coordination normal.  Skin: Skin is warm and dry.  Nursing note and vitals reviewed.   ED Course  Procedures (including critical care time)  MDM   Final diagnoses:  Pain, dental      Patient is well-appearing. Vital signs are stable. Airway is patent, no concerning symptoms for dental abscess or Ludwig's angina at this time.  She agrees to arrange follow-up with her dentist.  Prescription for tramadol and clindamycin   Kem Parkinson, PA-C 04/30/15 2159  Tanna Furry, MD 05/05/15 1045

## 2015-04-27 NOTE — ED Notes (Signed)
Lower dental pain X2 weeks. Also a cough and nasal congestion.

## 2015-04-30 ENCOUNTER — Emergency Department (HOSPITAL_COMMUNITY): Payer: 59

## 2015-04-30 ENCOUNTER — Encounter (HOSPITAL_COMMUNITY): Payer: Self-pay | Admitting: Emergency Medicine

## 2015-04-30 ENCOUNTER — Emergency Department (HOSPITAL_COMMUNITY)
Admission: EM | Admit: 2015-04-30 | Discharge: 2015-04-30 | Disposition: A | Discharge status: Home / Self Care | Payer: 59 | Attending: Emergency Medicine | Admitting: Emergency Medicine

## 2015-04-30 DIAGNOSIS — Z794 Long term (current) use of insulin: Secondary | ICD-10-CM | POA: Insufficient documentation

## 2015-04-30 DIAGNOSIS — R112 Nausea with vomiting, unspecified: Secondary | ICD-10-CM | POA: Insufficient documentation

## 2015-04-30 DIAGNOSIS — Z792 Long term (current) use of antibiotics: Secondary | ICD-10-CM | POA: Insufficient documentation

## 2015-04-30 DIAGNOSIS — E119 Type 2 diabetes mellitus without complications: Secondary | ICD-10-CM | POA: Insufficient documentation

## 2015-04-30 DIAGNOSIS — Z7984 Long term (current) use of oral hypoglycemic drugs: Secondary | ICD-10-CM | POA: Insufficient documentation

## 2015-04-30 DIAGNOSIS — G4489 Other headache syndrome: Secondary | ICD-10-CM

## 2015-04-30 DIAGNOSIS — I10 Essential (primary) hypertension: Secondary | ICD-10-CM | POA: Insufficient documentation

## 2015-04-30 LAB — CBC WITH DIFFERENTIAL/PLATELET
BASOS PCT: 0 %
Basophils Absolute: 0 10*3/uL (ref 0.0–0.1)
EOS ABS: 0 10*3/uL (ref 0.0–0.7)
EOS PCT: 0 %
HCT: 35.3 % — ABNORMAL LOW (ref 36.0–46.0)
Hemoglobin: 11.8 g/dL — ABNORMAL LOW (ref 12.0–15.0)
Lymphocytes Relative: 11 %
Lymphs Abs: 0.9 10*3/uL (ref 0.7–4.0)
MCH: 28.3 pg (ref 26.0–34.0)
MCHC: 33.4 g/dL (ref 30.0–36.0)
MCV: 84.7 fL (ref 78.0–100.0)
MONO ABS: 0.3 10*3/uL (ref 0.1–1.0)
MONOS PCT: 4 %
Neutro Abs: 7.1 10*3/uL (ref 1.7–7.7)
Neutrophils Relative %: 85 %
PLATELETS: 148 10*3/uL — AB (ref 150–400)
RBC: 4.17 MIL/uL (ref 3.87–5.11)
RDW: 13.7 % (ref 11.5–15.5)
WBC: 8.4 10*3/uL (ref 4.0–10.5)

## 2015-04-30 LAB — BASIC METABOLIC PANEL
Anion gap: 11 (ref 5–15)
BUN: 16 mg/dL (ref 6–20)
CALCIUM: 9.2 mg/dL (ref 8.9–10.3)
CO2: 26 mmol/L (ref 22–32)
CREATININE: 0.88 mg/dL (ref 0.44–1.00)
Chloride: 96 mmol/L — ABNORMAL LOW (ref 101–111)
GFR calc Af Amer: >60 mL/min (ref >60)
GLUCOSE: 445 mg/dL — AB (ref 65–99)
POTASSIUM: 3.9 mmol/L (ref 3.5–5.1)
SODIUM: 133 mmol/L — AB (ref 135–145)

## 2015-04-30 LAB — CBG MONITORING, ED
GLUCOSE-CAPILLARY: 362 mg/dL — AB (ref 65–99)
Glucose-Capillary: 413 mg/dL — ABNORMAL HIGH (ref 65–99)

## 2015-04-30 MED ORDER — INSULIN ASPART 100 UNIT/ML ~~LOC~~ SOLN
5.0000 [IU] | Freq: Once | SUBCUTANEOUS | Status: AC
  Administered 2015-04-30: 5 [IU] via SUBCUTANEOUS
  Filled 2015-04-30: qty 1

## 2015-04-30 MED ORDER — DIPHENHYDRAMINE HCL 50 MG/ML IJ SOLN
25.0000 mg | Freq: Once | INTRAMUSCULAR | Status: AC
  Administered 2015-04-30: 25 mg via INTRAVENOUS
  Filled 2015-04-30: qty 1

## 2015-04-30 MED ORDER — METOCLOPRAMIDE HCL 5 MG/ML IJ SOLN
10.0000 mg | Freq: Once | INTRAMUSCULAR | Status: AC
  Administered 2015-04-30: 10 mg via INTRAVENOUS
  Filled 2015-04-30: qty 2

## 2015-04-30 MED ORDER — SODIUM CHLORIDE 0.9 % IV BOLUS (SEPSIS)
1000.0000 mL | Freq: Once | INTRAVENOUS | Status: AC
  Administered 2015-04-30: 1000 mL via INTRAVENOUS

## 2015-04-30 NOTE — ED Provider Notes (Signed)
CSN: [id number]     Arrival date & time 04/30/15  [id number] History   First MD Initiated Contact with Patient 04/30/15 0536     Chief Complaint  Patient presents with  . Headache     Patient is a 39 y.o. female presenting with headaches. The history is provided by the patient.  Headache Location: left frontal. Quality:  Dull Onset quality:  Gradual Duration:  2 weeks Timing:  Constant Progression:  Worsening Chronicity:  New Similar to prior headaches: yes   Relieved by:  Nothing Worsened by:  Light Associated symptoms: nausea and vomiting   Associated symptoms: no fever, no focal weakness and no weakness   Pt reports she has had left sided HA for 2 weeks It has gradually worsened  No head injury No falls No focal weakness reported She reports she has had multiple episodes of vomiting over past day She is diabetic but has not checked her glucose She reports similar HA in the past  Past Medical History  Diagnosis Date  . Diabetes mellitus   . Hypertension    History reviewed. No pertinent past surgical history. History reviewed. No pertinent family history. Social History  Substance Use Topics  . Smoking status: Never Smoker   . Smokeless tobacco: None  . Alcohol Use: Yes     Comment: occ.   OB History    No data available     Review of Systems  Constitutional: Negative for fever.  Gastrointestinal: Positive for nausea and vomiting.  Neurological: Positive for headaches. Negative for focal weakness and weakness.  All other systems reviewed and are negative.     Allergies  Review of patient's allergies indicates no known allergies.  Home Medications   Prior to Admission medications   Medication Sig Start Date End Date Taking? Authorizing Provider  clindamycin (CLEOCIN) 150 MG capsule Take 2 capsules (300 mg total) by mouth 4 (four) times daily. For 7 days 04/27/15   Tammy Triplett, PA-C  ibuprofen (ADVIL,MOTRIN) 200 MG tablet Take 200 mg by mouth every 6  (six) hours as needed for fever, headache or moderate pain.    Historical Provider, MD  insulin glargine (LANTUS) 100 UNIT/ML injection Inject 35 Units into the skin 2 (two) times daily.     Historical Provider, MD  loratadine (CLARITIN) 10 MG tablet Take 10 mg by mouth daily as needed for allergies (for cough/allergies).    Historical Provider, MD  sitaGLIPtan-metformin (JANUMET) 50-500 MG per tablet Take 1 tablet by mouth 2 (two) times daily with a meal.    Historical Provider, MD  traMADol (ULTRAM) 50 MG tablet Take 1 tablet (50 mg total) by mouth every 6 (six) hours as needed. 04/27/15   Tammy Triplett, PA-C   BP 159/87 mmHg  Pulse 91  Temp(Src) 98.3 F (36.8 C) (Oral)  Resp 18  Ht 5\' 5"  (1.651 m)  Wt 212 lb (96.163 kg)  BMI 35.28 kg/m2  SpO2 96%  LMP 04/13/2015 Physical Exam CONSTITUTIONAL: Well developed/well nourished HEAD: Normocephalic/atraumatic, tenderness to left frontal region but no bruising noted EYES: EOMI/PERRL, no nystagmus, no ptosis ENMT: Mucous membranes moist NECK: supple no meningeal signs, no bruits SPINE/BACK:entire spine nontender CV: S1/S2 noted, no murmurs/rubs/gallops noted LUNGS: Lungs are clear to auscultation bilaterally, no apparent distress ABDOMEN: soft, nontender, no rebound or guarding GU:no cva tenderness NEURO:Awake/alert, face symmetric, no arm or leg drift is noted Equal 5/5 strength with shoulder abduction, elbow flex/extension, wrist flex/extension in upper extremities and equal hand grips bilaterally Cranial nerves  3/4/5/6/12/17/08/11/12 tested and intact Gait normal without ataxia No past pointing, but she is easily confused with commands and takes significant coaching to complete this portion of exam  Sensation to light touch intact in all extremities EXTREMITIES: pulses normal, full ROM SKIN: warm, color normal PSYCH: no abnormalities of mood noted, alert and oriented to situation   ED Course  Procedures  5:56 AM Soon after I  finished exam pt had copious amounts of vomiting She reports HA for 2 weeks but seen in in ED 3 days ago and had dental pain without mention of headache She is a poor historian but does admit taking pain medications about 2-3 hours ago However, due to significant vomiting, HA for 2 weeks, will proceed with neuro-imaging 6:47 AM Pt improved CT head negative I doubt occult SAH as HA gradual for 2 weeks I doubt other acute neurologic emergency Her glucose is elevated which could have contributed to her vomiting No signs of DKA She had no neuro deficits and currently not confused and watching TV Will give IV fluids/insulin and if no vomiting she will stable for d/c home  Labs Review Labs Reviewed  BASIC METABOLIC PANEL - Abnormal; Notable for the following:    Sodium 133 (*)    Chloride 96 (*)    Glucose, Bld 445 (*)    All other components within normal limits  CBC WITH DIFFERENTIAL/PLATELET - Abnormal; Notable for the following:    Hemoglobin 11.8 (*)    HCT 35.3 (*)    Platelets 148 (*)    All other components within normal limits  CBG MONITORING, ED - Abnormal; Notable for the following:    Glucose-Capillary 413 (*)    All other components within normal limits    Imaging Review Ct Head Wo Contrast  04/30/2015  CLINICAL DATA:  Headache for 2 hours. EXAM: CT HEAD WITHOUT CONTRAST TECHNIQUE: Contiguous axial images were obtained from the base of the skull through the vertex without intravenous contrast. COMPARISON:  None. FINDINGS: Mild patient motion artifact.No intracranial hemorrhage, mass effect, or midline shift. No hydrocephalus. The basilar cisterns are patent. No evidence of territorial infarct. No intracranial fluid collection. Calvarium is intact. Included paranasal sinuses and mastoid air cells are well aerated. IMPRESSION: No acute intracranial abnormality. Electronically Signed   By: Jeb Levering M.D.   On: 04/30/2015 06:34   I have personally reviewed and evaluated  these images and lab results as part of my medical decision-making.   Medications  metoCLOPramide (REGLAN) injection 10 mg (10 mg Intravenous Given 04/30/15 0558)  diphenhydrAMINE (BENADRYL) injection 25 mg (25 mg Intravenous Given 04/30/15 0558)  sodium chloride 0.9 % bolus 1,000 mL (1,000 mLs Intravenous New Bag/Given 04/30/15 0635)  insulin aspart (novoLOG) injection 5 Units (5 Units Subcutaneous Given 04/30/15 [id number])    MDM   Final diagnoses:  Other headache syndrome  Non-intractable vomiting with nausea, vomiting of unspecified type    Nursing notes including past medical history and social history reviewed and considered in documentation Labs/vital reviewed myself and considered during evaluation Previous records reviewed and considered     Ripley Fraise, MD 04/30/15 [phone]

## 2015-04-30 NOTE — ED Notes (Addendum)
Pt stated she took "pain Pill" 2 or 3 hours ago. States she has not taken all of the pain medication she was prescribed on last visit, she still has "some in the bottle".

## 2015-04-30 NOTE — Discharge Instructions (Signed)

## 2015-04-30 NOTE — ED Notes (Signed)
Pt was given ginger ale to drink. Tolerating well at this time with no nausea or vomiting.

## 2015-04-30 NOTE — ED Notes (Signed)
Pt states she has had a headache for 2 weeks. States she took " the pain pills" she got when she was "here before", she did not know what the were.

## 2015-11-21 ENCOUNTER — Observation Stay (HOSPITAL_COMMUNITY)
Admission: EM | Admit: 2015-11-21 | Discharge: 2015-11-22 | Disposition: A | Discharge status: Home / Self Care | Payer: BLUE CROSS/BLUE SHIELD | Attending: Family Medicine | Admitting: Family Medicine

## 2015-11-21 ENCOUNTER — Encounter (HOSPITAL_COMMUNITY): Payer: Self-pay | Admitting: Emergency Medicine

## 2015-11-21 DIAGNOSIS — I10 Essential (primary) hypertension: Secondary | ICD-10-CM | POA: Diagnosis not present

## 2015-11-21 DIAGNOSIS — Z794 Long term (current) use of insulin: Secondary | ICD-10-CM | POA: Insufficient documentation

## 2015-11-21 DIAGNOSIS — Z23 Encounter for immunization: Secondary | ICD-10-CM | POA: Diagnosis not present

## 2015-11-21 DIAGNOSIS — L0291 Cutaneous abscess, unspecified: Secondary | ICD-10-CM | POA: Diagnosis present

## 2015-11-21 DIAGNOSIS — E119 Type 2 diabetes mellitus without complications: Secondary | ICD-10-CM | POA: Diagnosis not present

## 2015-11-21 DIAGNOSIS — L03311 Cellulitis of abdominal wall: Secondary | ICD-10-CM | POA: Diagnosis not present

## 2015-11-21 DIAGNOSIS — R739 Hyperglycemia, unspecified: Secondary | ICD-10-CM

## 2015-11-21 DIAGNOSIS — E1165 Type 2 diabetes mellitus with hyperglycemia: Secondary | ICD-10-CM | POA: Insufficient documentation

## 2015-11-21 DIAGNOSIS — L02211 Cutaneous abscess of abdominal wall: Principal | ICD-10-CM | POA: Insufficient documentation

## 2015-11-21 LAB — CBG MONITORING, ED
GLUCOSE-CAPILLARY: 568 mg/dL — AB (ref 65–99)
GLUCOSE-CAPILLARY: 324 mg/dL — AB (ref 65–99)

## 2015-11-21 LAB — CBC WITH DIFFERENTIAL/PLATELET
BASOS PCT: 0 %
Basophils Absolute: 0 10*3/uL (ref 0.0–0.1)
Eosinophils Absolute: 0.1 10*3/uL (ref 0.0–0.7)
Eosinophils Relative: 1 %
HEMATOCRIT: 33.2 % — AB (ref 36.0–46.0)
HEMOGLOBIN: 11.7 g/dL — AB (ref 12.0–15.0)
LYMPHS ABS: 1.2 10*3/uL (ref 0.7–4.0)
Lymphocytes Relative: 9 %
MCH: 30.5 pg (ref 26.0–34.0)
MCHC: 35.2 g/dL (ref 30.0–36.0)
MCV: 86.7 fL (ref 78.0–100.0)
MONOS PCT: 4 %
Monocytes Absolute: 0.6 10*3/uL (ref 0.1–1.0)
NEUTROS ABS: 11.5 10*3/uL — AB (ref 1.7–7.7)
NEUTROS PCT: 86 %
Platelets: 186 10*3/uL (ref 150–400)
RBC: 3.83 MIL/uL — AB (ref 3.87–5.11)
RDW: 13 % (ref 11.5–15.5)
WBC: 13.4 10*3/uL — AB (ref 4.0–10.5)

## 2015-11-21 LAB — BASIC METABOLIC PANEL
Anion gap: 9 (ref 5–15)
BUN: 24 mg/dL — ABNORMAL HIGH (ref 6–20)
CHLORIDE: 93 mmol/L — AB (ref 101–111)
CO2: 25 mmol/L (ref 22–32)
CREATININE: 1.27 mg/dL — AB (ref 0.44–1.00)
Calcium: 8.7 mg/dL — ABNORMAL LOW (ref 8.9–10.3)
GFR calc non Af Amer: 52 mL/min — ABNORMAL LOW (ref >60)
GFR, EST AFRICAN AMERICAN: 60 mL/min — AB (ref >60)
Glucose, Bld: 529 mg/dL — ABNORMAL HIGH (ref 65–99)
POTASSIUM: 4.5 mmol/L (ref 3.5–5.1)
Sodium: 127 mmol/L — ABNORMAL LOW (ref 135–145)

## 2015-11-21 LAB — GLUCOSE, CAPILLARY
Glucose-Capillary: 306 mg/dL — ABNORMAL HIGH (ref 65–99)
Glucose-Capillary: 354 mg/dL — ABNORMAL HIGH (ref 65–99)

## 2015-11-21 MED ORDER — SODIUM CHLORIDE 0.9 % IV BOLUS (SEPSIS)
1000.0000 mL | Freq: Once | INTRAVENOUS | Status: AC
  Administered 2015-11-21: 1000 mL via INTRAVENOUS

## 2015-11-21 MED ORDER — INSULIN ASPART 100 UNIT/ML ~~LOC~~ SOLN: 0.0000 [IU] | Freq: Three times a day (TID) | SUBCUTANEOUS | Status: DC

## 2015-11-21 MED ORDER — SODIUM CHLORIDE 0.9% FLUSH: 3.0000 mL | INTRAVENOUS | Status: DC | PRN

## 2015-11-21 MED ORDER — POLYETHYLENE GLYCOL 3350 17 G PO PACK: 17.0000 g | PACK | Freq: Every day | ORAL | Status: DC | PRN

## 2015-11-21 MED ORDER — VANCOMYCIN HCL IN DEXTROSE 1-5 GM/200ML-% IV SOLN: 1000.0000 mg | Freq: Once | INTRAVENOUS | Status: DC

## 2015-11-21 MED ORDER — LIDOCAINE-EPINEPHRINE (PF) 1 %-1:200000 IJ SOLN
10.0000 mL | Freq: Once | INTRAMUSCULAR | Status: DC
  Filled 2015-11-21 (×2): qty 30

## 2015-11-21 MED ORDER — PIPERACILLIN-TAZOBACTAM 3.375 G IVPB 30 MIN
3.3750 g | Freq: Once | INTRAVENOUS | Status: AC
  Administered 2015-11-21: 3.375 g via INTRAVENOUS
  Filled 2015-11-21: qty 50

## 2015-11-21 MED ORDER — ONDANSETRON HCL 4 MG PO TABS: 4.0000 mg | ORAL_TABLET | Freq: Four times a day (QID) | ORAL | Status: DC | PRN

## 2015-11-21 MED ORDER — ONDANSETRON HCL 4 MG/2ML IJ SOLN: 4.0000 mg | Freq: Four times a day (QID) | INTRAMUSCULAR | Status: DC | PRN

## 2015-11-21 MED ORDER — SODIUM CHLORIDE 0.9 % IV SOLN: 250.0000 mL | INTRAVENOUS | Status: DC | PRN

## 2015-11-21 MED ORDER — PNEUMOCOCCAL VAC POLYVALENT 25 MCG/0.5ML IJ INJ
0.5000 mL | INJECTION | INTRAMUSCULAR | Status: AC
  Administered 2015-11-22: 0.5 mL via INTRAMUSCULAR
  Filled 2015-11-21: qty 0.5

## 2015-11-21 MED ORDER — INSULIN ASPART 100 UNIT/ML IV SOLN
10.0000 [IU] | Freq: Once | INTRAVENOUS | Status: AC
  Administered 2015-11-21: 10 [IU] via INTRAVENOUS

## 2015-11-21 MED ORDER — VANCOMYCIN HCL IN DEXTROSE 1-5 GM/200ML-% IV SOLN
1000.0000 mg | Freq: Two times a day (BID) | INTRAVENOUS | Status: DC
  Administered 2015-11-21 – 2015-11-22 (×2): 1000 mg via INTRAVENOUS
  Filled 2015-11-21 (×2): qty 200

## 2015-11-21 MED ORDER — ENOXAPARIN SODIUM 40 MG/0.4ML ~~LOC~~ SOLN
40.0000 mg | SUBCUTANEOUS | Status: DC
  Administered 2015-11-21: 40 mg via SUBCUTANEOUS
  Filled 2015-11-21: qty 0.4

## 2015-11-21 MED ORDER — ACETAMINOPHEN 650 MG RE SUPP: 650.0000 mg | Freq: Four times a day (QID) | RECTAL | Status: DC | PRN

## 2015-11-21 MED ORDER — INSULIN ASPART 100 UNIT/ML ~~LOC~~ SOLN
0.0000 [IU] | Freq: Three times a day (TID) | SUBCUTANEOUS | Status: DC
  Administered 2015-11-22: 11 [IU] via SUBCUTANEOUS
  Administered 2015-11-22: 5 [IU] via SUBCUTANEOUS

## 2015-11-21 MED ORDER — ACETAMINOPHEN 325 MG PO TABS: 650.0000 mg | ORAL_TABLET | Freq: Four times a day (QID) | ORAL | Status: DC | PRN

## 2015-11-21 MED ORDER — INSULIN ASPART 100 UNIT/ML ~~LOC~~ SOLN
0.0000 [IU] | Freq: Every day | SUBCUTANEOUS | Status: DC
  Administered 2015-11-21: 5 [IU] via SUBCUTANEOUS

## 2015-11-21 MED ORDER — SODIUM CHLORIDE 0.9% FLUSH
3.0000 mL | Freq: Two times a day (BID) | INTRAVENOUS | Status: DC
  Administered 2015-11-21 – 2015-11-22 (×2): 3 mL via INTRAVENOUS

## 2015-11-21 MED ORDER — VANCOMYCIN HCL 10 G IV SOLR
1500.0000 mg | Freq: Once | INTRAVENOUS | Status: AC
  Administered 2015-11-21: 1500 mg via INTRAVENOUS
  Filled 2015-11-21: qty 1500

## 2015-11-21 MED ORDER — INSULIN GLARGINE 100 UNIT/ML ~~LOC~~ SOLN
45.0000 [IU] | Freq: Two times a day (BID) | SUBCUTANEOUS | Status: DC
  Administered 2015-11-21 – 2015-11-22 (×2): 45 [IU] via SUBCUTANEOUS
  Filled 2015-11-21 (×4): qty 0.45

## 2015-11-21 NOTE — H&P (Signed)
History and Physical  Anne Carney [id number] DOB: 09-15-1975 DOA: 11/21/2015  Referring physician: Elnora Morrison, MD PCP: Everrett Coombe, NP   Chief Complaint: Abscess  HPI:  40 year old woman with uncontrolled diabetes mellitus with poor compliance presented with abdominal wall assess. Underwent incision and drainage in the emergency department and admitted for further treatment of abscess and uncontrolled hyperglycemia.  She reports noticing drainage about 2 weeks ago. She had mild drainage but no pain. Over the last two days drainage and pain that increasingly worsened. She reports pain 8-9/10 and foul smelling discharge. Pain mildly improved when lying down and exacerbated by moving and ambulating. Denies taking any pain medications. Currently on outpatient Amoxicillin (started 6/7) for tooth infection. Denies any fever or past infection in the area. Also complains of right shoulder pain, mild cough, pain with urination, abdominal pain and nausea for the last two days. Admits to not checking blood sugars or taking medications as required. Does not take Lantus consistently. No Lantus today or yesterday.  In the emergency department afebrile, VSS, no hypoxia.  Pertinent labs: Glucose 529, Creatinine 1.27, WBC 13.4, Sodium 127  Review of Systems:  Positive for abdominal pain, SOB, dysuria, nausea.  Negative for fever, visual changes, sore throat, rash, new muscle aches, chest pain, bleeding, vomiting.   Past Medical History  Diagnosis Date  . Diabetes mellitus   . Hypertension     Past Surgical History  Procedure Laterality Date  . Cesarean section      Social History:  reports that she has never smoked. She does not have any smokeless tobacco history on file. She reports that she drinks alcohol. She reports that she does not use illicit drugs.   No Known Allergies  Family History  Problem Relation Age of Onset  . Huntington's disease Father   . Asthma  Mother   . Diabetes Daughter      Prior to Admission medications   Medication Sig Start Date End Date Taking? Authorizing Provider  amoxicillin (AMOXIL) 500 MG capsule Take 500 mg by mouth 2 (two) times daily.   Yes Historical Provider, MD  GLIPIZIDE PO Take 2.5 mg by mouth 2 (two) times daily.   Yes Historical Provider, MD  ibuprofen (ADVIL,MOTRIN) 200 MG tablet Take 200 mg by mouth every 6 (six) hours as needed for fever, headache or moderate pain.   Yes Historical Provider, MD  insulin glargine (LANTUS) 100 UNIT/ML injection Inject 35 Units into the skin 2 (two) times daily.    Yes Historical Provider, MD   Physical Exam: Filed Vitals:   11/21/15 0830 11/21/15 0900 11/21/15 1002 11/21/15 1053  BP: 110/67 116/68 125/65 129/69  Pulse: 108 106 104 100  Temp:      TempSrc:      Resp:    18  Height:      Weight:      SpO2: 96% 100% 99% 100%    Constitutional:  . Appears calm and comfortable Eyes:  . PERRL and irises appear normal . Normal conjunctivae and lids ENMT:  . external ears, nose appear normal . Carie left bottom molar . grossly normal hearing, . Lips appear normal. Neck:  . neck appears normal, no masses, normal ROM, supple . no thyromegaly Respiratory:  . CTA bilaterally, no w/r/r.  . Respiratory effort normal. No retractions or accessory muscle use Cardiovascular:  . RRR, no m/r/g . No LE extremity edema   Abdomen:  . Abdomen appears normal; no tenderness or masses . No hernias Musculoskeletal:  .  RUE, LUE, RLE, LLE   o strength and tone normal, no atrophy, no abnormal movements o No tenderness, masses Skin:  . Incision bandaged in RLQ  . No erythema or induration. Some tenderness to palpation.    Neurologic:  . Grossly normal Psychiatric:  . judgement and insight appear normal . Mental status o Mood, affect appropriate  Wt Readings from Last 3 Encounters:  11/21/15 99.791 kg (220 lb)  04/30/15 96.163 kg (212 lb)  04/27/15 96.163 kg (212 lb)      Labs on Admission:  Basic Metabolic Panel:  Recent Labs Lab 11/21/15 0958  NA 127*  K 4.5  CL 93*  CO2 25  GLUCOSE 529*  BUN 24*  CREATININE 1.27*  CALCIUM 8.7*     CBC:  Recent Labs Lab 11/21/15 0958  WBC 13.4*  NEUTROABS 11.5*  HGB 11.7*  HCT 33.2*  MCV 86.7  PLT 186    CBG:  Recent Labs Lab 11/21/15 0944  GLUCAP 568*     Principal Problem:   Abdominal wall cellulitis Active Problems:   DM type 2 (diabetes mellitus, type 2) (HCC)   Assessment/Plan 1. Abdominal wall abscess s/p I&D in ED, per EDP no culture was sent. 2. DM uncontrolled with glucose 529, AG 9. Poor compliance with insulin and oral therapy at home. Treated with insulin and IV fluids in the emergency department. 3. AKI vs dehydration. Favor dehydration from osmotic diuresis secondary to hyperglycemia.   Obs. Appears stable for medical bed.  Empiric abx, wound care  IVF, SSI, Lantus  DM education, nutrition, etc  Check urine pregnancy  Code Status:   DVT prophylaxis: Lovenox Family Communication: Cousin bedside Disposition Plan/Anticipated LOS: Observation. Medical bed  Time spent: 55 minutes  Murray Hodgkins, MD  [facility] Direct contact: [phone] --[place]ia [place]  --[email or web address]; password [email or web address] and click  [email or web address] contact night coverage as above  11/21/2015, 2:29 PM    By signing my name below, I, Rennis Harding attest that this documentation has been prepared under the direction and in the presence of Murray Hodgkins, MD Electronically signed: Rennis Harding  11/21/2015 12:40pm   I personally performed the services described in this documentation. All medical record entries made by the scribe were at my direction. I have reviewed the chart and agree that the record reflects my personal performance and is accurate and complete. Murray Hodgkins, MD

## 2015-11-21 NOTE — ED Notes (Signed)
Report given to 300 nurse. No signs of distress noted. Pt taken to floor.

## 2015-11-21 NOTE — ED Notes (Signed)
Pt states she has a spot on her lower abdomen area that has been present for 17 years, but recently started draining a foul smelling drainage.  States she has problems where her c-section scar is.

## 2015-11-21 NOTE — ED Notes (Signed)
Dressing applied to pt's lower abd. gauze and medipore. Pt tolerated well.

## 2015-11-21 NOTE — Progress Notes (Signed)
Pharmacy Antibiotic Note  Anne Carney is a 40 y.o. female admitted on 11/21/2015 with Abscess.  Pharmacy has been consulted for Vancomcyin dosing.  Plan: Vancomycin loading dose of 1500mg  given in ED earlier today. Vancomycin 1000mg  IV every 12 hours.  Goal trough 10-15 mcg/mL.  Monitor labs, micro and vitals.   Height: 5\' 5"  (165.1 cm) Weight: 199 lb 1.2 oz (90.3 kg) IBW/kg (Calculated) : 57  Temp (24hrs), Avg:98.8 F (37.1 C), Min:98.6 F (37 C), Max:98.9 F (37.2 C)   Recent Labs Lab 11/21/15 0958  WBC 13.4*  CREATININE 1.27*    Estimated Creatinine Clearance: 64.7 mL/min (by C-G formula based on Cr of 1.27).    No Known Allergies  Antimicrobials this admission: Vanc 6/12 >>   Dose adjustments this admission: n/a  Microbiology results: 6/12 BCx:   Thank you for allowing pharmacy to be a part of this patient's care.  Pricilla Larsson 11/21/2015 8:40 PM

## 2015-11-21 NOTE — ED Provider Notes (Signed)
CSN: [id number]     Arrival date & time 11/21/15  0820 History   First MD Initiated Contact with Patient 11/21/15 0831     Chief Complaint  Patient presents with  . Abscess   HPI Anne Carney is a 40 y.o. female  presenting with abdominal abscess. Patient states this started about 1 week ago. She does report a cyst/mass around that area for the past 17 years after her c-section. Says 1 week ago the area started to get painful and drain around the same time. Draining green/brownish fluid, and has a smell to it. She has not tried anything for it. She has had some subjective chills but no measured fever. She has some nausea today but no vomiting.   Her diabetes has been poorly controlled, last A1c check in April was 13. She has not taken her insulin since Friday. She has not been regularly checking her blood sugars.  (Consider location/radiation/quality/duration/timing/severity/associated sxs/prior Treatment) Patient is a 40 y.o. female presenting with abscess. The history is provided by the patient.  Abscess Location:  Torso Torso abscess location:  Abd RLQ Size:  3cm Abscess quality: draining, fluctuance, painful and redness   Red streaking: no   Duration:  1 week Progression:  Worsening Pain details:    Quality:  Aching   Severity:  Moderate   Duration:  1 week   Timing:  Constant   Progression:  Worsening Chronicity:  New Context: diabetes   Context: not insect bite/sting and not skin injury   Relieved by:  None tried Worsened by:  Draining/squeezing Ineffective treatments:  Draining/squeezing Associated symptoms: nausea   Associated symptoms: no fever, no headaches and no vomiting   Risk factors: no hx of MRSA and no prior abscess     Past Medical History  Diagnosis Date  . Diabetes mellitus   . Hypertension    Past Surgical History  Procedure Laterality Date  . Cesarean section     History reviewed. No pertinent family history. Social History  Substance Use  Topics  . Smoking status: Never Smoker   . Smokeless tobacco: None  . Alcohol Use: Yes     Comment: occ.   OB History    No data available     Review of Systems  Constitutional: Negative for fever.  HENT: Negative for sore throat.   Respiratory: Negative for shortness of breath.   Cardiovascular: Negative for chest pain.  Gastrointestinal: Positive for nausea. Negative for vomiting.  Genitourinary: Positive for dysuria.  Skin: Positive for rash.  Neurological: Negative for light-headedness and headaches.  All other systems reviewed and are negative.     Allergies  Review of patient's allergies indicates no known allergies.  Home Medications   Prior to Admission medications   Medication Sig Start Date End Date Taking? Authorizing Provider  clindamycin (CLEOCIN) 150 MG capsule Take 2 capsules (300 mg total) by mouth 4 (four) times daily. For 7 days 04/27/15   Tammy Triplett, PA-C  ibuprofen (ADVIL,MOTRIN) 200 MG tablet Take 200 mg by mouth every 6 (six) hours as needed for fever, headache or moderate pain.    Historical Provider, MD  insulin glargine (LANTUS) 100 UNIT/ML injection Inject 35 Units into the skin 2 (two) times daily.     Historical Provider, MD  loratadine (CLARITIN) 10 MG tablet Take 10 mg by mouth daily as needed for allergies (for cough/allergies).    Historical Provider, MD  sitaGLIPtan-metformin (JANUMET) 50-500 MG per tablet Take 1 tablet by mouth 2 (two)  times daily with a meal.    Historical Provider, MD  traMADol (ULTRAM) 50 MG tablet Take 1 tablet (50 mg total) by mouth every 6 (six) hours as needed. 04/27/15   Tammy Triplett, PA-C   BP 121/74 mmHg  Pulse 112  Temp(Src) 98.6 F (37 C) (Oral)  Resp 18  Ht 5\' 5"  (1.651 m)  Wt 99.791 kg  BMI 36.61 kg/m2  SpO2 100%  LMP 11/21/2015 Physical Exam  Constitutional: She is oriented to person, place, and time. She appears well-developed and well-nourished. No distress.  HENT:  Head: Normocephalic and  atraumatic.  Eyes: Conjunctivae and EOM are normal. Pupils are equal, round, and reactive to light.  Cardiovascular: Regular rhythm, normal heart sounds and intact distal pulses.  Tachycardia present.   Pulmonary/Chest: Effort normal and breath sounds normal.  Abdominal: Soft. Bowel sounds are normal. She exhibits no distension. There is tenderness in the right lower quadrant.    Neurological: She is alert and oriented to person, place, and time.  Skin: Skin is warm.  Psychiatric: She has a normal mood and affect. Thought content normal.  Nursing note and vitals reviewed.   ED Course  Procedures (including critical care time) Labs Review Labs Reviewed  CBG MONITORING, ED - Abnormal; Notable for the following:    Glucose-Capillary 568 (*)    All other components within normal limits  CBC WITH DIFFERENTIAL/PLATELET  BASIC METABOLIC PANEL    Imaging Review No results found. I have personally reviewed and evaluated these images and lab results as part of my medical decision-making.   EKG Interpretation None     INCISION AND DRAINAGE Performed by: Tawanna Sat Consent: Verbal consent obtained. Risks and benefits: risks, benefits and alternatives were discussed Type: abscess  Body area: Right lower abdomen  Anesthesia: local infiltration  Incision was made with a scalpel.  Local anesthetic: lidocaine 1% with epinephrine  Anesthetic total: 10 ml  Complexity: complex Blunt dissection to break up loculations  Drainage: foul smelling, purulent, thick, sebaceous, initial incision produced a small amount of gas  Drainage amount: substantial, approx 10cc   Packing material: 1 in iodoform gauze  Patient tolerance: Patient tolerated the procedure well with no immediate complications.    MDM   Final diagnoses:  Abscess  Hyperglycemia   Appears to be an infected cyst, attempted removal of as much of capsule but it was fairly large and suspect there is still  substantial amount of capsule left -- discussed with her that referral to general surgery for definitive excision may help prevent future recurrence. Given appearance of drainage, concern for some gas formation, given first dose IV vanc/zosyn, blood cultures ordered. She appears overall well, afebrile, but is tachycardic with a leukocytosis (13.4).   Hyperglycemia -- AG is 9, not acidotic. Given 1L bolus, 10 units insulin. Likely a little dehydrated with a slight bump in creatinine to 1.27.   Discussed with patient observation admission which she was agreeable to. Discussed with admitting physician and will be admitted overnight for observation.    Leone Brand, MD 11/21/15 [place], MD 11/21/15 [phone]

## 2015-11-22 DIAGNOSIS — E119 Type 2 diabetes mellitus without complications: Secondary | ICD-10-CM | POA: Diagnosis not present

## 2015-11-22 DIAGNOSIS — L03311 Cellulitis of abdominal wall: Secondary | ICD-10-CM | POA: Diagnosis not present

## 2015-11-22 LAB — CBC
HCT: 30.1 % — ABNORMAL LOW (ref 36.0–46.0)
HEMOGLOBIN: 10.5 g/dL — AB (ref 12.0–15.0)
MCH: 30.4 pg (ref 26.0–34.0)
MCHC: 34.9 g/dL (ref 30.0–36.0)
MCV: 87.2 fL (ref 78.0–100.0)
Platelets: 174 10*3/uL (ref 150–400)
RBC: 3.45 MIL/uL — ABNORMAL LOW (ref 3.87–5.11)
RDW: 13 % (ref 11.5–15.5)
WBC: 8.3 10*3/uL (ref 4.0–10.5)

## 2015-11-22 LAB — GLUCOSE, CAPILLARY
GLUCOSE-CAPILLARY: 208 mg/dL — AB (ref 65–99)
GLUCOSE-CAPILLARY: 319 mg/dL — AB (ref 65–99)

## 2015-11-22 LAB — BASIC METABOLIC PANEL
ANION GAP: 7 (ref 5–15)
BUN: 17 mg/dL (ref 6–20)
CALCIUM: 8.1 mg/dL — AB (ref 8.9–10.3)
CO2: 24 mmol/L (ref 22–32)
Chloride: 102 mmol/L (ref 101–111)
Creatinine, Ser: 0.75 mg/dL (ref 0.44–1.00)
GLUCOSE: 252 mg/dL — AB (ref 65–99)
Potassium: 4 mmol/L (ref 3.5–5.1)
SODIUM: 133 mmol/L — AB (ref 135–145)

## 2015-11-22 MED ORDER — SULFAMETHOXAZOLE-TRIMETHOPRIM 800-160 MG PO TABS
2.0000 | ORAL_TABLET | Freq: Two times a day (BID) | ORAL | Status: DC
Start: 2015-11-22 — End: 2015-12-01

## 2015-11-22 MED ORDER — SULFAMETHOXAZOLE-TRIMETHOPRIM 800-160 MG PO TABS
2.0000 | ORAL_TABLET | Freq: Two times a day (BID) | ORAL | Status: DC
  Administered 2015-11-22: 2 via ORAL
  Filled 2015-11-22: qty 2

## 2015-11-22 NOTE — Progress Notes (Signed)
PROGRESS NOTE  Anne Carney [facility] [id number] DOB: Apr 25, 1976 DOA: 11/21/2015 PCP: Everrett Coombe, NP  Brief Narrative: 62 yof with a hx of uncontrolled DM with poor compliance presented with an abdominal wall abscess. While in the ED, she underwent I&D and was admitted for further treatment. Blood cultures were sent for further testing.   Assessment/Plan: 1. Abdominal wall abscess s/p I&D in ED. rapidly improving. Leukocytosis has resolved. No culture was sent in the emergency department. 2. DM uncontrolled with initial glucose 529, AG 9. Now improved. Poor compliance with insulin and oral therapy at home.  3. Dehydration from osmotic diuresis secondary to hyperglycemia. Resolved.   Change to oral antibiotics. Discussed in detail with patient wound care. Discharged with iodoform gauze and call us to overlay. The patient has a friend who is a home health nurse who will take care of this for her. Instructed in packing of the wound, no immersion. Return for erythema, drainage, pain or worsening condition.  Discussed diabetes with patient. She will use insulin as directed and check her blood sugars twice a day and bring log book to doctor's appointments.  Murray Hodgkins, MD  [facility] Direct contact: [phone] --Via amion app OR  --[email or web address]; password [email or web address]  7PM-7AM contact night coverage as above 11/22/2015, 6:15 AM    Consultants:  none  Procedures:  I&D of abd wall abscess  Antimicrobials:  Vancomycin 6/12 >>  HPI/Subjective: Feels improved. No pain, nausea or vomiting. No cough.   Objective: Filed Vitals:   11/21/15 1817 11/21/15 2030 11/22/15 0232 11/22/15 0559  BP: 110/93  115/71 114/65  Pulse: 103  91 93  Temp: 98.9 F (37.2 C)  98.5 F (36.9 C) 100.3 F (37.9 C)  TempSrc: Oral  Oral Oral  Resp: 18  16 16   Height: 5\' 5"  (1.651 m)     Weight: 90.3 kg (199 lb 1.2 oz)     SpO2: 100% 100% 100% 100%    Intake/Output Summary (Last 24  hours) at 11/22/15 0615 Last data filed at 11/21/15 2232  Gross per 24 hour  Intake    200 ml  Output      0 ml  Net    200 ml     Filed Weights   11/21/15 0826 11/21/15 1817  Weight: 99.791 kg (220 lb) 90.3 kg (199 lb 1.2 oz)    Exam:  Constitutional:  . Appears calm and comfortable Respiratory:  . CTA bilaterally, no w/r/r.  . Respiratory effort normal. No retractions or accessory muscle use Cardiovascular:  . RRR, no m/r/g . No LE extremity edema   Skin:  . Lower abdomen: 1cm width hole, surrounding skin appears healthy. Packing removed. No drainage or exudate. Psychiatric:  . judgement and insight appear normal . Mental status o Mood, affect appropriate  I have personally reviewed following labs and imaging studies:  Capillary blood sugars stable  Hgb 10.5, stable. Leukocytosis has resolved.  BMP unremarkable, BUN and Cr wnl.  No wound culture was sent   Scheduled Meds: . enoxaparin (LOVENOX) injection  40 mg Subcutaneous Q24H  . insulin aspart  0-15 Units Subcutaneous TID WC  . insulin aspart  0-5 Units Subcutaneous QHS  . insulin glargine  45 Units Subcutaneous BID  . pneumococcal 23 valent vaccine  0.5 mL Intramuscular Tomorrow-1000  . sodium chloride flush  3 mL Intravenous Q12H  . vancomycin  1,000 mg Intravenous Q12H   Continuous Infusions:   Principal Problem:   Abdominal wall cellulitis Active Problems:   DM  type 2 (diabetes mellitus, type 2) ([place])   Abscess   By signing my name below, I, Delene Ruffini, attest that this documentation has been prepared under the direction and in the presence of Libra Gatz P. Sarajane Jews, MD. Electronically Signed: Delene Ruffini, Scribe.  11/22/2015 11:15am    I personally performed the services described in this documentation. All medical record entries made by the scribe were at my direction. I have reviewed the chart and agree that the record reflects my personal performance and is accurate and  complete. Murray Hodgkins, MD

## 2015-11-22 NOTE — Discharge Summary (Addendum)
Physician Discharge Summary  Anne Carney [facility] [id number] DOB: Jul 25, 1975 DOA: 11/21/2015  PCP: Jerel Shepherd, FNP  Admit date: 11/21/2015 Discharge date: 11/22/2015  Recommendations for Outpatient Follow-up:  1. Follow-up of abdominal wall cellulitis and abscess. 2. Follow diabetes mellitus with some noncompliance with treatment regimen.   Follow-up Information    Follow up with [name], MD.   Specialty:  General Surgery   Why:  Wound check, As needed   Contact information:   [address] [place] [id number] [phone]       Follow up with Jerel Shepherd, FNP On 11/24/2015.   Specialty:  Family Medicine   Why:  wound check, @ 11:30   Contact information:   [address] [place] [address] [phone]      Discharge Diagnoses:  1. Abdominal wall abscess 2. DM Type II, uncontrolled 3. Dehydration  Discharge Condition: improved Disposition: discharge home  Diet recommendation: carb modified.  Filed Weights   11/21/15 0826 11/21/15 1817  Weight: 99.791 kg (220 lb) 90.3 kg (199 lb 1.2 oz)    History of present illness:  40 year old woman with uncontrolled diabetes mellitus with poor compliance presented with abdominal wall assess. Underwent incision and drainage in the emergency department and admitted for further treatment of abscess and uncontrolled hyperglycemia.  Hospital Course:  Patient was found to have an abdominal wall abscess. While in the ED, patient underwent I&D, however, no cultures were sent. She was started on empiric antibiotics. The following morning pain was much improved, and wound appeared to be improving. She has been educated on proper wound care and the importance of blood sugar management has been stressed. She will complete a course of outpatient antibiotics   Abdominal wall abscess s/p I&D in ED. rapidly improving. Leukocytosis has resolved. No culture was sent in the emergency  department.  DM uncontrolled with initial glucose 529, AG 9. Now improved. Poor compliance with insulin and oral therapy at home.   Dehydration from osmotic diuresis secondary to hyperglycemia. Resolved.   Change to oral antibiotics. Discussed in detail with patient wound care. Discharged with iodoform gauze and gauze to overlay. The patient has a friend who is a home health nurse who will take care of this for her. Instructed in packing of the wound, no immersion. Return for erythema, drainage, pain or worsening condition.  Discussed diabetes with patient. She will use insulin as directed and check her blood sugars twice a day and bring log book to doctor's appointments.  Consultants:  none  Procedures:  I&D of abd wall abscess  Antimicrobials:  Vancomycin 6/12 >> 6/13  Bactrim Discharge Instructions  Discharge Instructions    Activity as tolerated - No restrictions    Complete by:  As directed      Diet Carb Modified    Complete by:  As directed      Discharge instructions    Complete by:  As directed   Call your physician or seek immediate medical attention for pain, drainage, fever or worsening of condition. Change packing every other day, keep covered, do not immerse in water. Be sure to finish antibiotics. Check blood sugars twice a day and keep a notebook and bring to all your appointments.          Discharge Medication List as of 11/22/2015  1:12 PM    START taking these medications   Details  sulfamethoxazole-trimethoprim (BACTRIM DS,SEPTRA DS) 800-160 MG tablet Take 2 tablets by mouth every 12 (  twelve) hours., Starting 11/22/2015, Until Discontinued, Normal      CONTINUE these medications which have NOT CHANGED   Details  amoxicillin (AMOXIL) 500 MG capsule Take 500 mg by mouth 2 (two) times daily., Until Discontinued, Historical Med    GLIPIZIDE PO Take 5 mg by mouth 2 (two) times daily. , Until Discontinued, Historical Med    ibuprofen (ADVIL,MOTRIN) 200  MG tablet Take 200 mg by mouth every 6 (six) hours as needed for fever, headache or moderate pain., Until Discontinued, Historical Med    insulin glargine (LANTUS) 100 UNIT/ML injection Inject 35 Units into the skin 2 (two) times daily. , Until Discontinued, Historical Med       No Known Allergies  The results of significant diagnostics from this hospitalization (including imaging, microbiology, ancillary and laboratory) are listed below for reference.    Significant Diagnostic Studies: No results found.  Microbiology: Recent Results (from the past 240 hour(s))  Blood culture (routine x 2)     Status: None (Preliminary result)   Collection Time: 11/21/15 11:39 AM  Result Value Ref Range Status   Specimen Description BLOOD LEFT HAND  Final   Special Requests   Final    BOTTLES DRAWN AEROBIC AND ANAEROBIC AEB=6CC ANA=4CC   Culture NO GROWTH 1 DAY  Final   Report Status PENDING  Incomplete  Blood culture (routine x 2)     Status: None (Preliminary result)   Collection Time: 11/21/15 11:45 AM  Result Value Ref Range Status   Specimen Description BLOOD RIGHT HAND  Final   Special Requests   Final    BOTTLES DRAWN AEROBIC AND ANAEROBIC AEB=7CC ANA=6CC   Culture NO GROWTH 1 DAY  Final   Report Status PENDING  Incomplete     Labs: Basic Metabolic Panel:  Recent Labs Lab 11/21/15 0958 11/22/15 0430  NA 127* 133*  K 4.5 4.0  CL 93* 102  CO2 25 24  GLUCOSE 529* 252*  BUN 24* 17  CREATININE 1.27* 0.75  CALCIUM 8.7* 8.1*   CBC:  Recent Labs Lab 11/21/15 0958 11/22/15 0430  WBC 13.4* 8.3  NEUTROABS 11.5*  --   HGB 11.7* 10.5*  HCT 33.2* 30.1*  MCV 86.7 87.2  PLT 186 174   CBG:  Recent Labs Lab 11/21/15 1608 11/21/15 1941 11/21/15 2222 11/22/15 0723 11/22/15 1150  GLUCAP 324* 306* 354* 208* 319*    Principal Problem:   Abdominal wall cellulitis Active Problems:   DM type 2 (diabetes mellitus, type 2) ([place])   Abscess   Time coordinating discharge: 35  minutes  Signed:  Murray Hodgkins, MD [facility] 11/22/2015, 6:04 PM  By signing my name below, I, Delene Ruffini, attest that this documentation has been prepared under the direction and in the presence of Tad Fancher P. Sarajane Jews, MD. Electronically Signed: Delene Ruffini, Scribe.  11/22/2015 11:15am  I personally performed the services described in this documentation. All medical record entries made by the scribe were at my direction. I have reviewed the chart and agree that the record reflects my personal performance and is accurate and complete. Murray Hodgkins, MD

## 2015-11-22 NOTE — Progress Notes (Signed)
Inpatient Diabetes Program Recommendations  AACE/ADA: New Consensus Statement on Inpatient Glycemic Control (2015)  Target Ranges:  Prepandial:   less than 140 mg/dL      Peak postprandial:   less than 180 mg/dL (1-2 hours)      Critically ill patients:  140 - 180 mg/dL  Results for GAILA, SCALES (MRN [id number]) as of 11/22/2015 12:14  Ref. Range 11/21/2015 09:44 11/21/2015 16:08 11/21/2015 19:41 11/21/2015 22:22 11/22/2015 07:23 11/22/2015 11:50  Glucose-Capillary Latest Ref Range: 65-99 mg/dL 568 (HH) 324 (H) 306 (H) 354 (H) 208 (H) 319 (H)   Results for MARDY, OLLIVIERRE (MRN [id number]) as of 11/22/2015 12:14  Ref. Range 11/21/2015 09:58 11/22/2015 04:30  Glucose Latest Ref Range: 65-99 mg/dL 529 (H) 252 (H)   Review of Glycemic Control  Diabetes history: DM2 Outpatient Diabetes medications: Lantus 35 units BID, Glipizide 5 mg BID Current orders for Inpatient glycemic control: Lantus 45 units BID, Novolog 0-15 units TID with meals, Novolog 0-5 units QHS  Inpatient Diabetes Program Recommendations: Insulin - Meal Coverage: Please consider ordering Novolog 8 untis TID with meals for meal coverage.  NOTE: Spoke with patient about diabetes and home regimen for diabetes control. Patient reports that she is followed by Dr. Belenda Cruise at the [facility] Department for diabetes management and currently she takes Lantus 35 units BID and Glipizide 5 mg BID as an outpatient for diabetes control. Patient reports that she is taking Lantus and Glipizide as prescribed and that she last seen Dr. Belenda Cruise about 2 weeks ago. Patient reports that at last office visit with Dr. Belenda Cruise her Glipizide dose was increased from 2.5 mg BID to 5 mg BID. Patient states that she does not check her glucose as often as she should but when she has checked over the past 2 weeks her glucose has been from 200-400's mg/dl. Inquired about prior A1C and patient reports that her  last A1C value was in 13% range in January 2017. Explained that her  current A1C has been drawn but the results are not in the chart at this time. Explained that an A1C of 13% indicates an average glucose of 326 mg/dl. Discussed glucose and A1C goals. Discussed importance of checking CBGs and maintaining good CBG control to prevent long-term and short-term complications. Explained how hyperglycemia leads to damage within blood vessels which lead to the common complications seen with uncontrolled diabetes. Stressed to the patient the importance of improving glycemic control to prevent further complications from uncontrolled diabetes. Discussed impact of nutrition, exercise, stress, sickness, and medications on diabetes control.  Encouraged patient to check her glucose 4 times per day (before meals and at bedtime) and to keep a log book of glucose readings and insulin taken which she will need to take to doctor appointments. Explained how her doctor can use the log book to continue to make insulin adjustments if needed. Asked patient if she has ever used rapid acting insulin along with basal insulin and patient states that she has taken insulin with meals in the past. Patient stated that if she will be discharged on a short acting insulin she would prefer to be prescribed NOVOLIN R (as she can purchase Novolin R at [facility] for $25 per vial). Patient states that she has [employer] but the co-pays on her insulin is very expensive. She states that she pays about $200 out of pocket for her Lantus.  Informed patient she can search online for Lantus savings card which she can apply for and it should  allow her to have an out of pocket co-pay of $25 per vial. Patient verbalized understanding of information discussed and she states that she has no further questions at this time related to diabetes.  MD, patient would benefit from being prescribed Novolin R with meals for meal coverage. Please consider ordering Novolin R TID with meals at time of discharge.   Thanks, Barnie Alderman, RN,  MSN, CDE Diabetes Coordinator Inpatient Diabetes Program [phone] (Team Pager) [phone] ([facility] office) [phone] [facility] office) [phone] [facility] office)

## 2015-11-22 NOTE — Progress Notes (Signed)
Pt discharged home today per Dr. Sarajane Jews.  Pt's IV site D/C'd and WDL.  Pt's VSS.  Pt provided with home medication list, discharge instructions, prescriptions and work excuse.  Verbalized understanding.  Pt left floor via WC in stable condition accompanied by NT.

## 2015-11-23 LAB — HEMOGLOBIN A1C
HEMOGLOBIN A1C: 12.3 % — AB (ref 4.8–5.6)
MEAN PLASMA GLUCOSE: 306 mg/dL

## 2015-11-26 LAB — CULTURE, BLOOD (ROUTINE X 2)
CULTURE: NO GROWTH
CULTURE: NO GROWTH

## 2015-12-01 ENCOUNTER — Encounter (HOSPITAL_COMMUNITY): Payer: Self-pay | Admitting: Emergency Medicine

## 2015-12-01 ENCOUNTER — Emergency Department (HOSPITAL_COMMUNITY)
Admission: EM | Admit: 2015-12-01 | Discharge: 2015-12-01 | Disposition: A | Discharge status: Home / Self Care | Payer: BLUE CROSS/BLUE SHIELD | Attending: Emergency Medicine | Admitting: Emergency Medicine

## 2015-12-01 DIAGNOSIS — E119 Type 2 diabetes mellitus without complications: Secondary | ICD-10-CM | POA: Diagnosis not present

## 2015-12-01 DIAGNOSIS — K047 Periapical abscess without sinus: Secondary | ICD-10-CM | POA: Insufficient documentation

## 2015-12-01 DIAGNOSIS — Z794 Long term (current) use of insulin: Secondary | ICD-10-CM | POA: Insufficient documentation

## 2015-12-01 DIAGNOSIS — I10 Essential (primary) hypertension: Secondary | ICD-10-CM | POA: Diagnosis not present

## 2015-12-01 DIAGNOSIS — K0889 Other specified disorders of teeth and supporting structures: Secondary | ICD-10-CM | POA: Diagnosis present

## 2015-12-01 DIAGNOSIS — Z791 Long term (current) use of non-steroidal anti-inflammatories (NSAID): Secondary | ICD-10-CM | POA: Insufficient documentation

## 2015-12-01 DIAGNOSIS — Z7984 Long term (current) use of oral hypoglycemic drugs: Secondary | ICD-10-CM | POA: Diagnosis not present

## 2015-12-01 DIAGNOSIS — Z79899 Other long term (current) drug therapy: Secondary | ICD-10-CM | POA: Insufficient documentation

## 2015-12-01 LAB — CBG MONITORING, ED: GLUCOSE-CAPILLARY: 165 mg/dL — AB (ref 65–99)

## 2015-12-01 MED ORDER — CLINDAMYCIN HCL 150 MG PO CAPS: 300.0000 mg | ORAL_CAPSULE | Freq: Four times a day (QID) | ORAL | Status: DC

## 2015-12-01 MED ORDER — TRAMADOL HCL 50 MG PO TABS: 50.0000 mg | ORAL_TABLET | Freq: Four times a day (QID) | ORAL | Status: DC | PRN

## 2015-12-01 NOTE — ED Notes (Signed)
Pt has right sided jaw swelling.

## 2015-12-01 NOTE — Discharge Instructions (Signed)

## 2015-12-01 NOTE — ED Provider Notes (Signed)
CSN: [id number]     Arrival date & time 12/01/15  0620 History   First MD Initiated Contact with Patient 12/01/15 [phone]     Chief Complaint  Patient presents with  . Dental Pain     (Consider location/radiation/quality/duration/timing/severity/associated sxs/prior Treatment) HPI Comments: Presents to the ER for evaluation of dental pain and swelling of the right side of her lower jaw. Patient reports that symptoms began overnight. She had pain last night and took ibuprofen. When she awakened this morning the jaw was swollen. Patient reports that she recently had an infection on the left side of her mouth. She was also in the hospital recently for lower abdominal wall abscess. She was treated with amoxicillin and Bactrim, finished these in the last 2 days.  Patient is a 40 y.o. female presenting with tooth pain.  Dental Pain   Past Medical History  Diagnosis Date  . Diabetes mellitus   . Hypertension    Past Surgical History  Procedure Laterality Date  . Cesarean section     Family History  Problem Relation Age of Onset  . Huntington's disease Father   . Asthma Mother   . Diabetes Daughter    Social History  Substance Use Topics  . Smoking status: Never Smoker   . Smokeless tobacco: None  . Alcohol Use: Yes     Comment: occ.   OB History    No data available     Review of Systems  HENT: Positive for dental problem.   All other systems reviewed and are negative.     Allergies  Review of patient's allergies indicates no known allergies.  Home Medications   Prior to Admission medications   Medication Sig Start Date End Date Taking? Authorizing Provider  GLIPIZIDE PO Take 5 mg by mouth 2 (two) times daily.    Yes Historical Provider, MD  ibuprofen (ADVIL,MOTRIN) 200 MG tablet Take 200 mg by mouth every 6 (six) hours as needed for fever, headache or moderate pain.   Yes Historical Provider, MD  insulin glargine (LANTUS) 100 UNIT/ML injection Inject 35 Units into the  skin 2 (two) times daily.    Yes Historical Provider, MD  clindamycin (CLEOCIN) 150 MG capsule Take 2 capsules (300 mg total) by mouth 4 (four) times daily. 12/01/15   [name], MD  traMADol (ULTRAM) 50 MG tablet Take 1 tablet (50 mg total) by mouth every 6 (six) hours as needed. 12/01/15   [name], MD   BP 150/76 mmHg  Pulse 93  Temp(Src) 98.3 F (36.8 C) (Oral)  Resp 19  Ht 5\' 4"  (1.626 m)  Wt 210 lb (95.255 kg)  BMI 36.03 kg/m2  SpO2 98%  LMP 11/21/2015 Physical Exam  Constitutional: She is oriented to person, place, and time. She appears well-developed and well-nourished. No distress.  HENT:  Head: Normocephalic and atraumatic.    Right Ear: Hearing normal.  Left Ear: Hearing normal.  Nose: Nose normal.  Mouth/Throat: Oropharynx is clear and moist and mucous membranes are normal. Dental abscesses and dental caries present.    Eyes: Conjunctivae and EOM are normal. Pupils are equal, round, and reactive to light.  Neck: Normal range of motion. Neck supple.  Cardiovascular: Regular rhythm, S1 normal and S2 normal.  Exam reveals no gallop and no friction rub.   No murmur heard. Pulmonary/Chest: Effort normal and breath sounds normal. No respiratory distress. She exhibits no tenderness.  Abdominal: Soft. Normal appearance and bowel sounds are normal. There is no  hepatosplenomegaly. There is no tenderness. There is no rebound, no guarding, no tenderness at McBurney's point and negative Murphy's sign. No hernia.  Musculoskeletal: Normal range of motion.  Neurological: She is alert and oriented to person, place, and time. She has normal strength. No cranial nerve deficit or sensory deficit. Coordination normal. GCS eye subscore is 4. GCS verbal subscore is 5. GCS motor subscore is 6.  Skin: Skin is warm, dry and intact. No rash noted. No cyanosis.  Psychiatric: She has a normal mood and affect. Her speech is normal and behavior is normal. Thought content  normal.  Nursing note and vitals reviewed.   ED Course  Procedures (including critical care time) Labs Review Labs Reviewed - No data to display  Imaging Review No results found. I have personally reviewed and evaluated these images and lab results as part of my medical decision-making.   EKG Interpretation None      MDM   Final diagnoses:  Dental abscess    Patient with early dental abscess. She is a diabetic. Blood sugar is well controlled. I did discuss with her risk of C. difficile colitis with multiple antibiotic administrations, however, antibiotics are unavoidable in this case. She also just finished a course of amoxicillin and Bactrim, need to use a non-penicillin antibiotic. Patient will be prescribed clindamycin, first dose IM. Follow-up with dentist as soon as possible.    [name], MD 12/01/15 [phone]

## 2017-01-01 ENCOUNTER — Encounter (HOSPITAL_COMMUNITY): Payer: Self-pay | Admitting: Emergency Medicine

## 2017-01-01 ENCOUNTER — Emergency Department (HOSPITAL_COMMUNITY)
Admission: EM | Admit: 2017-01-01 | Discharge: 2017-01-01 | Disposition: A | Discharge status: Home / Self Care | Payer: BLUE CROSS/BLUE SHIELD | Attending: Emergency Medicine | Admitting: Emergency Medicine

## 2017-01-01 DIAGNOSIS — Z794 Long term (current) use of insulin: Secondary | ICD-10-CM | POA: Insufficient documentation

## 2017-01-01 DIAGNOSIS — X509XXA Other and unspecified overexertion or strenuous movements or postures, initial encounter: Secondary | ICD-10-CM | POA: Diagnosis not present

## 2017-01-01 DIAGNOSIS — S161XXA Strain of muscle, fascia and tendon at neck level, initial encounter: Secondary | ICD-10-CM | POA: Diagnosis not present

## 2017-01-01 DIAGNOSIS — S199XXA Unspecified injury of neck, initial encounter: Secondary | ICD-10-CM | POA: Diagnosis present

## 2017-01-01 DIAGNOSIS — Y939 Activity, unspecified: Secondary | ICD-10-CM | POA: Diagnosis not present

## 2017-01-01 DIAGNOSIS — E119 Type 2 diabetes mellitus without complications: Secondary | ICD-10-CM | POA: Insufficient documentation

## 2017-01-01 DIAGNOSIS — Y999 Unspecified external cause status: Secondary | ICD-10-CM | POA: Insufficient documentation

## 2017-01-01 DIAGNOSIS — Y929 Unspecified place or not applicable: Secondary | ICD-10-CM | POA: Insufficient documentation

## 2017-01-01 DIAGNOSIS — I10 Essential (primary) hypertension: Secondary | ICD-10-CM | POA: Diagnosis not present

## 2017-01-01 LAB — CBG MONITORING, ED: Glucose-Capillary: 244 mg/dL — ABNORMAL HIGH (ref 65–99)

## 2017-01-01 MED ORDER — NAPROXEN 500 MG PO TABS: 500.0000 mg | ORAL_TABLET | Freq: Two times a day (BID) | ORAL | 0 refills | Status: DC

## 2017-01-01 MED ORDER — METHOCARBAMOL 500 MG PO TABS: 500.0000 mg | ORAL_TABLET | Freq: Three times a day (TID) | ORAL | 0 refills | Status: DC

## 2017-01-01 NOTE — ED Triage Notes (Signed)
Pt reports left neck pain radiating to left shoulder x2 days, denies injury.

## 2017-01-01 NOTE — ED Provider Notes (Signed)
[facility] DEPT Provider Note   CSN: [id number] Arrival date & time: 01/01/17  0730     History   Chief Complaint Chief Complaint  Patient presents with  . Neck Pain    HPI Anne Carney is a 41 y.o. female.  HPI   Anne Carney is a 41 y.o. female with hx of DM,  presents to the Emergency Department complaining of left sided neck and upper shoulder pain.  She describes an aching pain from the side of her neck down to her shoulder.  Pain is reproduced with rotation of the neck and certain movements including reaching and bending.  She denies known injury, but does work at [employer].  Pain was gradual in onset.  She also denies chest pain, shortness of breath, visual changes, slurred speech, facial weakness, numbness or weakness of the upper extremities.  She has not tried any therapies at home.  Pt admits to chronically elevated blood sugar.    Past Medical History:  Diagnosis Date  . Diabetes mellitus   . Hypertension     Patient Active Problem List   Diagnosis Date Noted  . Abdominal wall cellulitis 11/21/2015  . DM type 2 (diabetes mellitus, type 2) ([place]) 11/21/2015  . Abscess 11/21/2015    Past Surgical History:  Procedure Laterality Date  . CESAREAN SECTION      OB History    No data available       Home Medications    Prior to Admission medications   Medication Sig Start Date End Date Taking? Authorizing Provider  clindamycin (CLEOCIN) 150 MG capsule Take 2 capsules (300 mg total) by mouth 4 (four) times daily. 12/01/15   [name], MD  GLIPIZIDE PO Take 5 mg by mouth 2 (two) times daily.     [provider]  ibuprofen (ADVIL,MOTRIN) 200 MG tablet Take 200 mg by mouth every 6 (six) hours as needed for fever, headache or moderate pain.    [provider]  insulin glargine (LANTUS) 100 UNIT/ML injection Inject 35 Units into the skin 2 (two) times daily.     [provider]  traMADol (ULTRAM) 50 MG  tablet Take 1 tablet (50 mg total) by mouth every 6 (six) hours as needed. 12/01/15   [name], MD    Family History Family History  Problem Relation Age of Onset  . Huntington's disease Father   . Asthma Mother   . Diabetes Daughter     Social History Social History  Substance Use Topics  . Smoking status: Never Smoker  . Smokeless tobacco: Not on file  . Alcohol use Yes     Comment: occ.     Allergies   Patient has no known allergies.   Review of Systems Review of Systems  Constitutional: Negative for chills and fever.  Respiratory: Negative for shortness of breath.   Cardiovascular: Negative for chest pain and palpitations.  Gastrointestinal: Negative for abdominal pain and vomiting.  Genitourinary: Negative for difficulty urinating and dysuria.  Musculoskeletal: Positive for arthralgias and neck pain (left neck pain). Negative for back pain, joint swelling and neck stiffness.  Skin: Negative for color change and wound.  Neurological: Negative for dizziness, syncope, facial asymmetry, speech difficulty, weakness, numbness and headaches.  All other systems reviewed and are negative.    Physical Exam Updated Vital Signs BP (!) 143/84 (BP Location: Right Arm)   Pulse 91   Temp 99.5 F (37.5 C) (Oral)   Resp 18  Ht 5\' 5"  (1.651 m)   Wt 73.5 kg (162 lb)   LMP 12/11/2016 (Exact Date)   SpO2 100%   BMI 26.96 kg/m   Physical Exam  Constitutional: She is oriented to person, place, and time. She appears well-developed and well-nourished. No distress.  HENT:  Head: Normocephalic and atraumatic.  Mouth/Throat: Oropharynx is clear and moist.  Eyes: Pupils are equal, round, and reactive to light. EOM are normal.  Neck: Phonation normal. Muscular tenderness present. No spinous process tenderness present. No neck rigidity. Decreased range of motion present. No erythema present. No Brudzinski's sign and no Kernig's sign noted. No thyromegaly present.    Cardiovascular: Normal rate, regular rhythm and intact distal pulses.   No murmur heard. Pulmonary/Chest: Effort normal and breath sounds normal. No respiratory distress. She exhibits no tenderness.  Musculoskeletal: She exhibits tenderness. She exhibits no edema.       Right shoulder: She exhibits normal range of motion.       Cervical back: She exhibits tenderness. She exhibits normal range of motion, no bony tenderness, no swelling, no deformity, no spasm and normal pulse.  ttp of the left cervical paraspinal muscles and along the left trapezius muscle.  Grip strength is strong and equal bilaterally.  Distal sensation intact,  CR < 2 sec.     Lymphadenopathy:    She has no cervical adenopathy.  Neurological: She is alert and oriented to person, place, and time. She has normal strength. No sensory deficit. She exhibits normal muscle tone. Coordination and gait normal. GCS eye subscore is 4. GCS verbal subscore is 5. GCS motor subscore is 6.  Reflex Scores:      Tricep reflexes are 2+ on the right side and 2+ on the left side.      Bicep reflexes are 2+ on the right side and 2+ on the left side. CN II-XII intact.  No pronator drift.  No facial asymmetry. Speech clear  Skin: Skin is warm and dry. Capillary refill takes less than 2 seconds.  Psychiatric: She has a normal mood and affect.  Nursing note and vitals reviewed.    ED Treatments / Results  Labs (all labs ordered are listed, but only abnormal results are displayed) Labs Reviewed - No data to display  EKG  EKG Interpretation None       Radiology No results found.  Procedures Procedures (including critical care time)  Medications Ordered in ED Medications - No data to display   Initial Impression / Assessment and Plan / ED Course  I have reviewed the triage vital signs and the nursing notes.  Pertinent labs & imaging results that were available during my care of the patient were reviewed by me and considered in  my medical decision making (see chart for details).     Pt well appearing.  No focal neuro deficits.  Pain to left neck that is reproducible with palpation and movement.  No concerning sx's for ACS, or CVA.  Pt appears stable for d/c.  Return precautions discussed.      Final Clinical Impressions(s) / ED Diagnoses   Final diagnoses:  Acute strain of neck muscle, initial encounter    New Prescriptions New Prescriptions   No medications on file     Kem Parkinson, Hershal Coria 01/01/17 8921    Daleen Bo, MD 01/01/17 1531

## 2017-01-01 NOTE — ED Notes (Signed)
Pt made aware to return if symptoms worsen or if any life threatening symptoms occur.   

## 2017-01-01 NOTE — Discharge Instructions (Signed)
Alternate ice and heat to your neck.  Follow-up with your primary doctor for recheck in one week if not improving.  Return here for any worsening symptoms

## 2017-08-26 ENCOUNTER — Other Ambulatory Visit (HOSPITAL_COMMUNITY): Payer: Self-pay | Admitting: Family Medicine

## 2017-08-26 DIAGNOSIS — IMO0002 Reserved for concepts with insufficient information to code with codable children: Secondary | ICD-10-CM

## 2017-08-26 DIAGNOSIS — R229 Localized swelling, mass and lump, unspecified: Principal | ICD-10-CM

## 2017-08-27 ENCOUNTER — Ambulatory Visit (HOSPITAL_COMMUNITY)
Admission: RE | Admit: 2017-08-27 | Discharge: 2017-08-27 | Disposition: A | Discharge status: Home / Self Care | Payer: BLUE CROSS/BLUE SHIELD | Source: Ambulatory Visit | Attending: Family Medicine | Admitting: Family Medicine

## 2017-08-27 DIAGNOSIS — IMO0002 Reserved for concepts with insufficient information to code with codable children: Secondary | ICD-10-CM

## 2017-08-27 DIAGNOSIS — Z803 Family history of malignant neoplasm of breast: Secondary | ICD-10-CM | POA: Diagnosis not present

## 2017-08-27 DIAGNOSIS — N6312 Unspecified lump in the right breast, upper inner quadrant: Secondary | ICD-10-CM | POA: Diagnosis not present

## 2017-08-27 DIAGNOSIS — R229 Localized swelling, mass and lump, unspecified: Secondary | ICD-10-CM

## 2018-01-13 ENCOUNTER — Encounter (HOSPITAL_COMMUNITY): Payer: Self-pay | Admitting: Emergency Medicine

## 2018-01-13 ENCOUNTER — Emergency Department (HOSPITAL_COMMUNITY)
Admission: EM | Admit: 2018-01-13 | Discharge: 2018-01-13 | Disposition: A | Discharge status: Home / Self Care | Payer: BLUE CROSS/BLUE SHIELD | Attending: Emergency Medicine | Admitting: Emergency Medicine

## 2018-01-13 ENCOUNTER — Other Ambulatory Visit: Payer: Self-pay

## 2018-01-13 DIAGNOSIS — R739 Hyperglycemia, unspecified: Secondary | ICD-10-CM

## 2018-01-13 DIAGNOSIS — I10 Essential (primary) hypertension: Secondary | ICD-10-CM | POA: Insufficient documentation

## 2018-01-13 DIAGNOSIS — Z79899 Other long term (current) drug therapy: Secondary | ICD-10-CM | POA: Insufficient documentation

## 2018-01-13 DIAGNOSIS — H9202 Otalgia, left ear: Secondary | ICD-10-CM

## 2018-01-13 DIAGNOSIS — E1165 Type 2 diabetes mellitus with hyperglycemia: Secondary | ICD-10-CM | POA: Insufficient documentation

## 2018-01-13 DIAGNOSIS — Z760 Encounter for issue of repeat prescription: Secondary | ICD-10-CM

## 2018-01-13 DIAGNOSIS — Z794 Long term (current) use of insulin: Secondary | ICD-10-CM | POA: Insufficient documentation

## 2018-01-13 LAB — CBG MONITORING, ED
GLUCOSE-CAPILLARY: 427 mg/dL — AB (ref 70–99)
Glucose-Capillary: 373 mg/dL — ABNORMAL HIGH (ref 70–99)

## 2018-01-13 MED ORDER — ANTIPYRINE-BENZOCAINE 5.4-1.4 % OT SOLN: 3.0000 [drp] | OTIC | 0 refills | Status: DC | PRN

## 2018-01-13 MED ORDER — OMEPRAZOLE 20 MG PO CPDR: 20.0000 mg | DELAYED_RELEASE_CAPSULE | Freq: Every day | ORAL | 0 refills | Status: DC

## 2018-01-13 MED ORDER — AMOXICILLIN 500 MG PO CAPS: 500.0000 mg | ORAL_CAPSULE | Freq: Three times a day (TID) | ORAL | 0 refills | Status: DC

## 2018-01-13 NOTE — ED Triage Notes (Signed)
Left ear pain x 3 days. Nad.

## 2018-01-13 NOTE — Discharge Instructions (Addendum)
Medication as directed.  Follow-up with your primary doctor for recheck or return to the ER for any worsening symptoms.

## 2018-01-15 NOTE — ED Provider Notes (Signed)
[facility] EMERGENCY DEPARTMENT Provider Note   CSN: [id number] Arrival date & time: 01/13/18  7341     History   Chief Complaint Chief Complaint  Patient presents with  . Otalgia    HPI Anne Carney is a 42 y.o. female.  HPI  Anne Carney is a 42 y.o. female who presents to the Emergency Department complaining of left ear pain for 3 days.  She describes the pain as throbbing and pressure and pain with movement of the external ear.  She has been trying to clean her ear with Q tips.  She denies swelling, drainage, dizziness, headache and pain behind the ear.  She also requests refill of her prilosec.  Has hx of GERD and ran out of her medication one month ago.  Denies abdominal pain, vomiting. No hearing loss  Past Medical History:  Diagnosis Date  . Diabetes mellitus   . Hypertension     Patient Active Problem List   Diagnosis Date Noted  . Abdominal wall cellulitis 11/21/2015  . DM type 2 (diabetes mellitus, type 2) ([place]) 11/21/2015  . Abscess 11/21/2015    Past Surgical History:  Procedure Laterality Date  . CESAREAN SECTION       OB History   None      Home Medications    Prior to Admission medications   Medication Sig Start Date End Date Taking? Authorizing Provider  amoxicillin (AMOXIL) 500 MG capsule Take 1 capsule (500 mg total) by mouth 3 (three) times daily. 01/13/18   [name], PA-C  antipyrine-benzocaine Toniann Fail) OTIC solution Place 3-4 drops into the left ear every 2 (two) hours as needed for ear pain. 01/13/18   [name], PA-C  clindamycin (CLEOCIN) 150 MG capsule Take 2 capsules (300 mg total) by mouth 4 (four) times daily. 12/01/15   [name], MD  GLIPIZIDE PO Take 5 mg by mouth 2 (two) times daily.     [provider]  insulin glargine (LANTUS) 100 UNIT/ML injection Inject 35 Units into the skin 2 (two) times daily.     [provider]  methocarbamol (ROBAXIN) 500 MG tablet Take 1 tablet (500 mg  total) by mouth 3 (three) times daily. 01/01/17   Lindsay Straka, PA-C  naproxen (NAPROSYN) 500 MG tablet Take 1 tablet (500 mg total) by mouth 2 (two) times daily with a meal. 01/01/17   Gesenia Bantz, PA-C  omeprazole (PRILOSEC) 20 MG capsule Take 1 capsule (20 mg total) by mouth daily. 01/13/18   [name], PA-C  traMADol (ULTRAM) 50 MG tablet Take 1 tablet (50 mg total) by mouth every 6 (six) hours as needed. 12/01/15   [name], MD    Family History Family History  Problem Relation Age of Onset  . Huntington's disease Father   . Asthma Mother   . Diabetes Daughter     Social History Social History   Tobacco Use  . Smoking status: Never Smoker  . Smokeless tobacco: Never Used  Substance Use Topics  . Alcohol use: Yes    Comment: occ.  . Drug use: No     Allergies   Patient has no known allergies.   Review of Systems Review of Systems  Constitutional: Negative for activity change, appetite change and fever.  HENT: Positive for ear pain. Negative for congestion, ear discharge, hearing loss, sore throat and trouble swallowing.   Respiratory: Negative for cough and shortness of breath.   Cardiovascular: Negative for chest pain.  Gastrointestinal: Negative for abdominal  pain, nausea and vomiting.  Endocrine: Negative for polydipsia and polyuria.  Musculoskeletal: Negative for arthralgias.  Skin: Negative for color change and rash.  Neurological: Negative for dizziness and headaches.     Physical Exam Updated Vital Signs BP (!) 151/84 (BP Location: Right Arm)   Pulse 89   Temp 98.5 F (36.9 C) (Oral)   Resp 17   LMP 12/30/2017   SpO2 100%   Physical Exam  Constitutional: She appears well-developed. No distress.  HENT:  Head: Atraumatic.  Right Ear: Tympanic membrane, external ear and ear canal normal.  Left Ear: External ear and ear canal normal. No drainage or swelling. No mastoid tenderness. Tympanic membrane is erythematous. Tympanic  membrane is not bulging. A middle ear effusion is present.  Mouth/Throat: Oropharynx is clear and moist.  Neck: Normal range of motion.  Cardiovascular: Normal rate and regular rhythm.  Pulmonary/Chest: Effort normal. No respiratory distress.  Abdominal: Soft. She exhibits no distension. There is no tenderness.  Musculoskeletal: Normal range of motion. She exhibits no edema.  Lymphadenopathy:    She has no cervical adenopathy.  Neurological: She is alert. No sensory deficit.  Skin: Skin is warm.  Nursing note and vitals reviewed.    ED Treatments / Results  Labs (all labs ordered are listed, but only abnormal results are displayed) Labs Reviewed  CBG MONITORING, ED - Abnormal; Notable for the following components:      Result Value   Glucose-Capillary 427 (*)    All other components within normal limits  CBG MONITORING, ED - Abnormal; Notable for the following components:   Glucose-Capillary 373 (*)    All other components within normal limits  CBG MONITORING, ED    EKG None  Radiology No results found.  Procedures Procedures (including critical care time)  Medications Ordered in ED Medications - No data to display   Initial Impression / Assessment and Plan / ED Course  I have reviewed the triage vital signs and the nursing notes.  Pertinent labs & imaging results that were available during my care of the patient were reviewed by me and considered in my medical decision making (see chart for details).    Pt well appearing.  Vitals reassuring.  Left otalgia.  Hyperglycemic, but has not taken morning dose of medication, CBG trending downward.  Discussed importance of taking her medications regularly and avoiding sugars.  No concerning sx's for DKA.  Pt verbalized understanding, agrees to plan.  Return precautions discussed.    Final Clinical Impressions(s) / ED Diagnoses   Final diagnoses:  Otalgia of left ear  Medication refill  Hyperglycemia    ED Discharge  Orders        Ordered    omeprazole (PRILOSEC) 20 MG capsule  Daily     01/13/18 1149    amoxicillin (AMOXIL) 500 MG capsule  3 times daily     01/13/18 1149    antipyrine-benzocaine (AURALGAN) OTIC solution  Every 2 hours PRN     01/13/18 1149       Nyemah Watton, [place], PA-C 01/15/18 2148    Elnora Morrison, MD 01/21/18 [phone]

## 2018-05-09 NOTE — Congregational Nurse Program (Signed)
Seen at the [employer]. B P  154/97 P 97 Blood Glucose 430. Recommended that pt go to ER for elevated b p and glucose Information given for [facility]. Discussed importance of taking medications as prescribed, drinking more water instead of sweetened drinks and eating a heart healthy diet. Voiced understanding Calley Crawley/ Erma Heritage RN, [facility], [place]

## 2018-10-29 ENCOUNTER — Other Ambulatory Visit (HOSPITAL_COMMUNITY): Payer: Self-pay | Admitting: Family Medicine

## 2018-10-29 DIAGNOSIS — Z1231 Encounter for screening mammogram for malignant neoplasm of breast: Secondary | ICD-10-CM

## 2018-11-10 ENCOUNTER — Ambulatory Visit (HOSPITAL_COMMUNITY)
Admission: RE | Admit: 2018-11-10 | Discharge: 2018-11-10 | Disposition: A | Discharge status: Home / Self Care | Payer: PRIVATE HEALTH INSURANCE | Source: Ambulatory Visit | Attending: Family Medicine | Admitting: Family Medicine

## 2018-11-10 ENCOUNTER — Other Ambulatory Visit: Payer: Self-pay

## 2018-11-10 DIAGNOSIS — Z1231 Encounter for screening mammogram for malignant neoplasm of breast: Secondary | ICD-10-CM | POA: Insufficient documentation

## 2019-04-15 ENCOUNTER — Other Ambulatory Visit: Payer: Self-pay | Admitting: *Deleted

## 2019-04-15 DIAGNOSIS — Z20822 Contact with and (suspected) exposure to covid-19: Secondary | ICD-10-CM

## 2019-04-16 ENCOUNTER — Telehealth: Payer: Self-pay | Admitting: *Deleted

## 2019-04-16 LAB — NOVEL CORONAVIRUS, NAA: SARS-CoV-2, NAA: NOT DETECTED

## 2019-04-16 NOTE — Telephone Encounter (Signed)
Pt calling for covid results; still active. Pt made aware, verbalizes understanding. Made aware she will receive call, MyChart message.

## 2019-12-02 ENCOUNTER — Other Ambulatory Visit: Payer: Self-pay

## 2019-12-02 ENCOUNTER — Encounter: Payer: Self-pay | Admitting: Podiatry

## 2019-12-02 ENCOUNTER — Ambulatory Visit (INDEPENDENT_AMBULATORY_CARE_PROVIDER_SITE_OTHER): Payer: 59 | Admitting: Podiatry

## 2019-12-02 DIAGNOSIS — E0843 Diabetes mellitus due to underlying condition with diabetic autonomic (poly)neuropathy: Secondary | ICD-10-CM | POA: Diagnosis not present

## 2019-12-02 DIAGNOSIS — B351 Tinea unguium: Secondary | ICD-10-CM

## 2019-12-02 DIAGNOSIS — M79676 Pain in unspecified toe(s): Secondary | ICD-10-CM

## 2019-12-02 DIAGNOSIS — L989 Disorder of the skin and subcutaneous tissue, unspecified: Secondary | ICD-10-CM | POA: Diagnosis not present

## 2019-12-02 DIAGNOSIS — M2041 Other hammer toe(s) (acquired), right foot: Secondary | ICD-10-CM | POA: Diagnosis not present

## 2019-12-02 DIAGNOSIS — M2042 Other hammer toe(s) (acquired), left foot: Secondary | ICD-10-CM

## 2019-12-02 NOTE — Progress Notes (Signed)
   SUBJECTIVE Patient with a history of diabetes mellitus presents to office today complaining of elongated, thickened nails that cause pain while ambulating in shoes.  She is unable to trim her own nails.  Patient also complains of numbness and tingling to the bilateral feet.  Her PCP has prescribed her gabapentin.  She only gets minimal relief with the gabapentin.  Patient is here for further evaluation and treatment.   Past Medical History:  Diagnosis Date  . Diabetes mellitus   . Hypertension     OBJECTIVE General Patient is awake, alert, and oriented x 3 and in no acute distress. Derm Skin is dry and supple bilateral. Negative open lesions or macerations. Remaining integument unremarkable. Nails are tender, long, thickened and dystrophic with subungual debris, consistent with onychomycosis, 1-5 bilateral. No signs of infection noted.  There is also some hyperkeratotic callus tissue noted to the distal tuft of the second digits bilateral Vasc  DP and PT pedal pulses palpable bilaterally. Temperature gradient within normal limits.  Neuro Epicritic and protective threshold sensation diminished bilaterally.  Musculoskeletal Exam No symptomatic pedal deformities noted bilateral. Muscular strength within normal limits.  Hammertoe contracture deformity noted digits 2-5 bilateral  ASSESSMENT 1. Diabetes Mellitus w/ peripheral neuropathy-poorly controlled 2. Onychomycosis of nail due to dermatophyte bilateral 3.  Benign skin lesion bilateral second digits 4.  Hammertoe deformity digits 2-5 bilateral  PLAN OF CARE 1. Patient evaluated today. 2. Instructed to maintain good pedal hygiene and foot care. Stressed importance of controlling blood sugar.  3. Mechanical debridement of nails 1-5 bilaterally performed using a nail nipper. Filed with dremel without incident.  4.  Excisional debridement of the hyperkeratotic callus tissue was performed using a chisel blade without incident or bleeding   5.  Continue gabapentin as per PCP.  I explained that the most important aspect of controlling her symptoms of neuropathy would be to reduce her blood glucose levels and better management and control of her diabetes  6.  Return to clinic in 3 mos.   *Works at [employer] in [place] years  Edrick Kins, DPM [facility]  Dr. Edrick Kins, [address]                                        [place], [place] [address]                Office [phone]  Fax [phone]

## 2020-04-27 ENCOUNTER — Emergency Department (HOSPITAL_COMMUNITY)
Admission: EM | Admit: 2020-04-27 | Discharge: 2020-04-27 | Disposition: A | Discharge status: Home / Self Care | Payer: 59 | Attending: Emergency Medicine | Admitting: Emergency Medicine

## 2020-04-27 ENCOUNTER — Encounter (HOSPITAL_COMMUNITY): Payer: Self-pay | Admitting: Emergency Medicine

## 2020-04-27 ENCOUNTER — Other Ambulatory Visit: Payer: Self-pay

## 2020-04-27 DIAGNOSIS — Z794 Long term (current) use of insulin: Secondary | ICD-10-CM | POA: Diagnosis not present

## 2020-04-27 DIAGNOSIS — R739 Hyperglycemia, unspecified: Secondary | ICD-10-CM

## 2020-04-27 DIAGNOSIS — I10 Essential (primary) hypertension: Secondary | ICD-10-CM | POA: Diagnosis not present

## 2020-04-27 DIAGNOSIS — N3000 Acute cystitis without hematuria: Secondary | ICD-10-CM | POA: Insufficient documentation

## 2020-04-27 DIAGNOSIS — Z79899 Other long term (current) drug therapy: Secondary | ICD-10-CM | POA: Diagnosis not present

## 2020-04-27 DIAGNOSIS — Z7984 Long term (current) use of oral hypoglycemic drugs: Secondary | ICD-10-CM | POA: Insufficient documentation

## 2020-04-27 DIAGNOSIS — E1165 Type 2 diabetes mellitus with hyperglycemia: Secondary | ICD-10-CM | POA: Diagnosis not present

## 2020-04-27 LAB — HEPATIC FUNCTION PANEL
ALT: 34 U/L (ref 0–44)
AST: 33 U/L (ref 15–41)
Albumin: 3.6 g/dL (ref 3.5–5.0)
Alkaline Phosphatase: 217 U/L — ABNORMAL HIGH (ref 38–126)
Bilirubin, Direct: 0.1 mg/dL (ref 0.0–0.2)
Indirect Bilirubin: 0.8 mg/dL (ref 0.3–0.9)
Total Bilirubin: 0.9 mg/dL (ref 0.3–1.2)
Total Protein: 7.3 g/dL (ref 6.5–8.1)

## 2020-04-27 LAB — URINALYSIS, ROUTINE W REFLEX MICROSCOPIC
Bilirubin Urine: NEGATIVE
Glucose, UA: >=500 mg/dL — AB
Hgb urine dipstick: NEGATIVE
Ketones, ur: 20 mg/dL — AB
Nitrite: NEGATIVE
Protein, ur: NEGATIVE mg/dL
Specific Gravity, Urine: 1.02 (ref 1.005–1.030)
pH: 6 (ref 5.0–8.0)

## 2020-04-27 LAB — BASIC METABOLIC PANEL
Anion gap: 13 (ref 5–15)
BUN: 20 mg/dL (ref 6–20)
CO2: 27 mmol/L (ref 22–32)
Calcium: 9.3 mg/dL (ref 8.9–10.3)
Chloride: 93 mmol/L — ABNORMAL LOW (ref 98–111)
Creatinine, Ser: 0.85 mg/dL (ref 0.44–1.00)
GFR, Estimated: >60 mL/min (ref >60)
Glucose, Bld: 451 mg/dL — ABNORMAL HIGH (ref 70–99)
Potassium: 4.1 mmol/L (ref 3.5–5.1)
Sodium: 133 mmol/L — ABNORMAL LOW (ref 135–145)

## 2020-04-27 LAB — CBC
HCT: 34 % — ABNORMAL LOW (ref 36.0–46.0)
Hemoglobin: 10.8 g/dL — ABNORMAL LOW (ref 12.0–15.0)
MCH: 26.5 pg (ref 26.0–34.0)
MCHC: 31.8 g/dL (ref 30.0–36.0)
MCV: 83.5 fL (ref 80.0–100.0)
Platelets: 119 10*3/uL — ABNORMAL LOW (ref 150–400)
RBC: 4.07 MIL/uL (ref 3.87–5.11)
RDW: 13.8 % (ref 11.5–15.5)
WBC: 5.7 10*3/uL (ref 4.0–10.5)
nRBC: 0 % (ref 0.0–0.2)

## 2020-04-27 LAB — CBG MONITORING, ED
Glucose-Capillary: 422 mg/dL — ABNORMAL HIGH (ref 70–99)
Glucose-Capillary: 398 mg/dL — ABNORMAL HIGH (ref 70–99)

## 2020-04-27 LAB — POC URINE PREG, ED: Preg Test, Ur: NEGATIVE

## 2020-04-27 MED ORDER — CEPHALEXIN 500 MG PO CAPS: 500.0000 mg | ORAL_CAPSULE | Freq: Four times a day (QID) | ORAL | 0 refills | Status: DC

## 2020-04-27 MED ORDER — SODIUM CHLORIDE 0.9 % IV SOLN
1.0000 g | Freq: Once | INTRAVENOUS | Status: AC
  Administered 2020-04-27: 1 g via INTRAVENOUS
  Filled 2020-04-27: qty 10

## 2020-04-27 MED ORDER — INSULIN ASPART 100 UNIT/ML ~~LOC~~ SOLN
10.0000 [IU] | Freq: Once | SUBCUTANEOUS | Status: AC
  Administered 2020-04-27: 10 [IU] via SUBCUTANEOUS
  Filled 2020-04-27: qty 1

## 2020-04-27 MED ORDER — SODIUM CHLORIDE 0.9 % IV BOLUS
1000.0000 mL | Freq: Once | INTRAVENOUS | Status: AC
  Administered 2020-04-27: 1000 mL via INTRAVENOUS

## 2020-04-27 NOTE — Discharge Instructions (Addendum)
Keep track of your glucose while you have this urinary tract infection.  You may need to increase your insulin from 35 units to 40 units if your sugar continues to stay high.  Follow-up with your doctor next week

## 2020-04-27 NOTE — ED Provider Notes (Signed)
[facility] EMERGENCY DEPARTMENT Provider Note   CSN: [id number] Arrival date & time: 04/27/20  9242     History Chief Complaint  Patient presents with  . Hyperglycemia    Anne Carney is a 44 y.o. female.  Patient complains of feeling weak.  Patient has diabetes and her sugar has been elevated.  No vomiting no diarrhea no fever  The history is provided by the patient and medical records. No language interpreter was used.  Weakness Severity:  Moderate Onset quality:  Sudden Timing:  Constant Progression:  Worsening Chronicity:  Recurrent Context: not alcohol use   Relieved by:  Nothing Worsened by:  Nothing Ineffective treatments:  None tried Associated symptoms: no abdominal pain, no chest pain, no cough, no diarrhea, no frequency, no headaches and no seizures        Past Medical History:  Diagnosis Date  . Diabetes mellitus   . Hypertension     Patient Active Problem List   Diagnosis Date Noted  . Abdominal wall cellulitis 11/21/2015  . DM type 2 (diabetes mellitus, type 2) ([place]) 11/21/2015  . Abscess 11/21/2015    Past Surgical History:  Procedure Laterality Date  . CESAREAN SECTION       OB History   No obstetric history on file.     Family History  Problem Relation Age of Onset  . Huntington's disease Father   . Asthma Mother   . Diabetes Daughter     Social History   Tobacco Use  . Smoking status: Never Smoker  . Smokeless tobacco: Never Used  Substance Use Topics  . Alcohol use: Yes    Comment: occ.  . Drug use: No    Home Medications Prior to Admission medications   Medication Sig Start Date End Date Taking? Authorizing Provider  amoxicillin (AMOXIL) 500 MG capsule Take 1 capsule (500 mg total) by mouth 3 (three) times daily. 01/13/18   Triplett, Tammy, PA-C  antipyrine-benzocaine Toniann Fail) OTIC solution Place 3-4 drops into the left ear every 2 (two) hours as needed for ear pain. 01/13/18   Triplett, Tammy, PA-C  cephALEXin  (KEFLEX) 500 MG capsule Take 1 capsule (500 mg total) by mouth 4 (four) times daily. 04/27/20   Milton Ferguson, MD  clindamycin (CLEOCIN) 150 MG capsule Take 2 capsules (300 mg total) by mouth 4 (four) times daily. 12/01/15   [name], MD  GLIPIZIDE PO Take 5 mg by mouth 2 (two) times daily.     [provider]  insulin glargine (LANTUS) 100 UNIT/ML injection Inject 35 Units into the skin 2 (two) times daily.     [provider]  methocarbamol (ROBAXIN) 500 MG tablet Take 1 tablet (500 mg total) by mouth 3 (three) times daily. 01/01/17   Triplett, Tammy, PA-C  naproxen (NAPROSYN) 500 MG tablet Take 1 tablet (500 mg total) by mouth 2 (two) times daily with a meal. 01/01/17   Triplett, Tammy, PA-C  omeprazole (PRILOSEC) 20 MG capsule Take 1 capsule (20 mg total) by mouth daily. 01/13/18   Triplett, Tammy, PA-C  traMADol (ULTRAM) 50 MG tablet Take 1 tablet (50 mg total) by mouth every 6 (six) hours as needed. 12/01/15   [name], MD    Allergies    Patient has no known allergies.  Review of Systems   Review of Systems  Constitutional: Negative for appetite change and fatigue.  HENT: Negative for congestion, ear discharge and sinus pressure.   Eyes: Negative for discharge.  Respiratory: Negative for  cough.   Cardiovascular: Negative for chest pain.  Gastrointestinal: Negative for abdominal pain and diarrhea.  Genitourinary: Negative for frequency and hematuria.  Musculoskeletal: Negative for back pain.  Skin: Negative for rash.  Neurological: Positive for weakness. Negative for seizures and headaches.  Psychiatric/Behavioral: Negative for hallucinations.    Physical Exam Updated Vital Signs BP 137/74 (BP Location: Right Arm)   Pulse (!) 108   Temp 98.9 F (37.2 C) (Oral)   Resp 18   SpO2 99%   Physical Exam Vitals and nursing note reviewed.  Constitutional:      Appearance: She is well-developed.  HENT:     Head: Normocephalic.     Nose:  Nose normal.  Eyes:     General: No scleral icterus.    Conjunctiva/sclera: Conjunctivae normal.  Neck:     Thyroid: No thyromegaly.  Cardiovascular:     Rate and Rhythm: Normal rate and regular rhythm.     Heart sounds: No murmur heard.  No friction rub. No gallop.   Pulmonary:     Breath sounds: No stridor. No wheezing or rales.  Chest:     Chest wall: No tenderness.  Abdominal:     General: There is no distension.     Tenderness: There is no abdominal tenderness. There is no rebound.  Musculoskeletal:        General: Normal range of motion.     Cervical back: Neck supple.  Lymphadenopathy:     Cervical: No cervical adenopathy.  Skin:    Findings: No erythema or rash.  Neurological:     Mental Status: She is alert and oriented to person, place, and time.     Motor: No abnormal muscle tone.     Coordination: Coordination normal.  Psychiatric:        Behavior: Behavior normal.     ED Results / Procedures / Treatments   Labs (all labs ordered are listed, but only abnormal results are displayed) Labs Reviewed  BASIC METABOLIC PANEL - Abnormal; Notable for the following components:      Result Value   Sodium 133 (*)    Chloride 93 (*)    Glucose, Bld 451 (*)    All other components within normal limits  CBC - Abnormal; Notable for the following components:   Hemoglobin 10.8 (*)    HCT 34.0 (*)    Platelets 119 (*)    All other components within normal limits  URINALYSIS, ROUTINE W REFLEX MICROSCOPIC - Abnormal; Notable for the following components:   APPearance HAZY (*)    Glucose, UA >=500 (*)    Ketones, ur 20 (*)    Leukocytes,Ua TRACE (*)    Bacteria, UA RARE (*)    All other components within normal limits  HEPATIC FUNCTION PANEL - Abnormal; Notable for the following components:   Alkaline Phosphatase 217 (*)    All other components within normal limits  CBG MONITORING, ED - Abnormal; Notable for the following components:   Glucose-Capillary 422 (*)     All other components within normal limits  CBG MONITORING, ED - Abnormal; Notable for the following components:   Glucose-Capillary 398 (*)    All other components within normal limits  URINE CULTURE  POC URINE PREG, ED  CBG MONITORING, ED    EKG None  Radiology No results found.  Procedures Procedures (including critical care time)  Medications Ordered in ED Medications  sodium chloride 0.9 % bolus 1,000 mL (0 mLs Intravenous Stopped 04/27/20 1156)  cefTRIAXone (  ROCEPHIN) 1 g in sodium chloride 0.9 % 100 mL IVPB (0 g Intravenous Stopped 04/27/20 1330)  insulin aspart (novoLOG) injection 10 Units (10 Units Subcutaneous Given 04/27/20 1221)    ED Course  I have reviewed the triage vital signs and the nursing notes.  Pertinent labs & imaging results that were available during my care of the patient were reviewed by me and considered in my medical decision making (see chart for details).    MDM Rules/Calculators/A&P                          Patient has elevated blood sugar and urinary tract infection.  She is placed on antibiotics and will increase her dose of insulin as needed.  Patient improved with treatment Final Clinical Impression(s) / ED Diagnoses Final diagnoses:  Hyperglycemia  Acute cystitis without hematuria    Rx / DC Orders ED Discharge Orders         Ordered    cephALEXin (KEFLEX) 500 MG capsule  4 times daily        04/27/20 1523           Milton Ferguson, MD 05/02/20 [phone]

## 2020-04-27 NOTE — ED Triage Notes (Signed)
Pt comes from work via [employer]. Pt manager called [facility] due to patient sitting in her car not feeling right." Pts CBG 422.

## 2020-04-30 LAB — URINE CULTURE: Culture: >=100000 — AB

## 2020-05-01 ENCOUNTER — Telehealth: Payer: Self-pay | Admitting: Emergency Medicine

## 2020-05-01 NOTE — Telephone Encounter (Signed)
Post ED Visit - Positive Culture Follow-up  Culture report reviewed by antimicrobial stewardship pharmacist: [place] [facility] []  Elenor Quinones, Pharm.D. []  Heide Guile, Pharm.D., BCPS AQ-ID []  Parks Neptune, Pharm.D., BCPS []  Alycia Rossetti, Pharm.D., BCPS []  [place], [place].D., BCPS, AAHIVP []  Legrand Como, Pharm.D., BCPS, AAHIVP []  Salome Arnt, PharmD, BCPS []  Johnnette Gourd, PharmD, BCPS []  Hughes Better, PharmD, BCPS [x]  Duanne Limerick, PharmD []  Laqueta Linden, PharmD, BCPS []  Albertina Parr, PharmD  [place] Team []  Leodis Sias, PharmD []  Lindell Spar, PharmD []  Royetta Asal, PharmD []  Graylin Shiver, Rph []  Rema Fendt) Glennon Mac, PharmD []  Arlyn Dunning, PharmD []  Netta Cedars, PharmD []  Dia Sitter, PharmD []  Leone Haven, PharmD []  Gretta Arab, PharmD []  Theodis Shove, PharmD []  Peggyann Juba, PharmD []  Reuel Boom, PharmD   Positive urine culture Treated with Cephalexin, organism sensitive to the same and no further patient follow-up is required at this time.  Anne Carney 05/01/2020, 4:26 PM

## 2020-07-07 ENCOUNTER — Ambulatory Visit (INDEPENDENT_AMBULATORY_CARE_PROVIDER_SITE_OTHER): Payer: 59

## 2020-07-07 ENCOUNTER — Ambulatory Visit (INDEPENDENT_AMBULATORY_CARE_PROVIDER_SITE_OTHER): Payer: 59 | Admitting: Podiatry

## 2020-07-07 ENCOUNTER — Other Ambulatory Visit: Payer: Self-pay

## 2020-07-07 DIAGNOSIS — L97514 Non-pressure chronic ulcer of other part of right foot with necrosis of bone: Secondary | ICD-10-CM | POA: Diagnosis not present

## 2020-07-07 DIAGNOSIS — E1142 Type 2 diabetes mellitus with diabetic polyneuropathy: Secondary | ICD-10-CM

## 2020-07-07 DIAGNOSIS — M2042 Other hammer toe(s) (acquired), left foot: Secondary | ICD-10-CM | POA: Diagnosis not present

## 2020-07-07 DIAGNOSIS — M869 Osteomyelitis, unspecified: Secondary | ICD-10-CM

## 2020-07-07 DIAGNOSIS — M86171 Other acute osteomyelitis, right ankle and foot: Secondary | ICD-10-CM | POA: Diagnosis not present

## 2020-07-07 MED ORDER — MUPIROCIN 2 % EX OINT: 1.0000 "application " | TOPICAL_OINTMENT | Freq: Two times a day (BID) | CUTANEOUS | 2 refills | Status: DC

## 2020-07-07 NOTE — Progress Notes (Signed)
  Subjective:  Patient ID: Anne Carney, female    DOB: December 01, 1975,  MRN: [id number]  Chief Complaint  Patient presents with  . Nail Problem    Right foot 2nd toe has a opened area     45 y.o. female presents with the above complaint. History confirmed with patient.  She has a wound that developed a couple weeks ago.  The first time she has had 1.  Has been draining, she is been applying Band-Aid.  She has a callus on the tip of the second toe on the left foot as well.  Her PCP noticed the ulcer and sent her oral antibiotics which she has completed  Objective:  Physical Exam: warm, good capillary refill and normal DP and PT pulses.  Abnormal sensory exam with loss of protective sensation to monofilament.  Hammertoe deformities 2 through 5 bilaterally.  The severest of which are the second toes on both feet.  The right second toe has a full-thickness ulceration with exposed subcutaneous tissue, this probes directly to bone.  Measures approximately 1 cm x 1 cm x 0.6 cm in depth.  No purulence or malodor.  Left second hammertoe has a distal tip callus and is semireducible in nature   Radiographs: X-ray of the right foot: Osteolysis of the distal phalanx of the second toe concerning for osteomyelitis Assessment:   1. Osteomyelitis of second toe of right foot ([place])   2. Hammertoe of left foot   3. Type 2 diabetes mellitus with diabetic polyneuropathy, unspecified whether long term insulin use ([place])      Plan:  Patient was evaluated and treated and all questions answered.  Patient educated on diabetes. Discussed proper diabetic foot care and discussed risks and complications of disease. Educated patient in depth on reasons to return to the office immediately should he/she discover anything concerning or new on the feet. All questions answered. Discussed proper shoes as well.   I discussed with her the findings of the second toe ulcer that is concerning for osteomyelitis.  I discussed with  her we could get an MRI to confirm this but considering the physical and plain film radiographic findings I think we should proceed with partial toe amputation.  There is a good soft tissue envelope that I think we can only resect the distal phalanx and leave the middle phalanx which appears to be normal on her radiographs.  For the left second toe for a preventative measure I think we should perform a flexor tenotomy to reduce the contracture and prevent ulceration healing well.  She understands the risks and wished to proceed.  Informed consent was signed and reviewed.  In the interim, I will put her on doxycycline and have her apply mupirocin ointment daily.   Surgical plan:  Procedure: -Partial toe amputation right second toe, flexor tenotomy left second toe  Location: -[facility]  Anesthesia plan: -IV sedation local anesthesia  Postoperative pain plan: - Tylenol 1000 mg every 6 hours, ibuprofen 600 mg every 6 hours, gabapentin 300 mg every 8 hours x5 days, oxycodone 5 mg 1-2 tabs every 6 hours only as needed  DVT prophylaxis: -none required  WB Restrictions / DME needs: -WBAT in surgical shoes, dispense at surgical center   No follow-ups on file.

## 2020-07-08 MED ORDER — DOXYCYCLINE HYCLATE 100 MG PO TABS: 100.0000 mg | ORAL_TABLET | Freq: Two times a day (BID) | ORAL | 0 refills | Status: DC

## 2020-07-11 ENCOUNTER — Telehealth: Payer: Self-pay

## 2020-07-11 NOTE — Telephone Encounter (Signed)
DOS 07/12/2020  AMPUTATION TOE IPJ 2ND RT - 28825 TENOTOMY 2ND LT - [place] REQUIRED FOR CPT [phone] OR [id number] ONG#[id number]

## 2020-07-12 ENCOUNTER — Other Ambulatory Visit: Payer: Self-pay | Admitting: Podiatry

## 2020-07-12 DIAGNOSIS — M2042 Other hammer toe(s) (acquired), left foot: Secondary | ICD-10-CM | POA: Diagnosis not present

## 2020-07-12 DIAGNOSIS — M86672 Other chronic osteomyelitis, left ankle and foot: Secondary | ICD-10-CM

## 2020-07-12 MED ORDER — OXYCODONE HCL 5 MG PO TABS: 5.0000 mg | ORAL_TABLET | ORAL | 0 refills | Status: AC | PRN

## 2020-07-12 MED ORDER — IBUPROFEN 600 MG PO TABS: 600.0000 mg | ORAL_TABLET | Freq: Four times a day (QID) | ORAL | 0 refills | Status: AC | PRN

## 2020-07-12 MED ORDER — ACETAMINOPHEN 500 MG PO TABS: 1000.0000 mg | ORAL_TABLET | Freq: Four times a day (QID) | ORAL | 0 refills | Status: AC | PRN

## 2020-07-12 NOTE — Progress Notes (Signed)
07/12/20 [place] right 2nd toe partial amputation

## 2020-07-19 ENCOUNTER — Encounter: Payer: Self-pay | Admitting: Podiatry

## 2020-07-19 ENCOUNTER — Ambulatory Visit (INDEPENDENT_AMBULATORY_CARE_PROVIDER_SITE_OTHER): Payer: 59 | Admitting: Podiatry

## 2020-07-19 ENCOUNTER — Other Ambulatory Visit: Payer: Self-pay

## 2020-07-19 ENCOUNTER — Ambulatory Visit (INDEPENDENT_AMBULATORY_CARE_PROVIDER_SITE_OTHER): Payer: 59

## 2020-07-19 DIAGNOSIS — M869 Osteomyelitis, unspecified: Secondary | ICD-10-CM

## 2020-07-19 DIAGNOSIS — M2042 Other hammer toe(s) (acquired), left foot: Secondary | ICD-10-CM

## 2020-07-19 DIAGNOSIS — Z9889 Other specified postprocedural states: Secondary | ICD-10-CM

## 2020-07-20 ENCOUNTER — Encounter: Payer: Self-pay | Admitting: Podiatry

## 2020-07-20 NOTE — Progress Notes (Signed)
  Subjective:  Patient ID: Anne Carney, female    DOB: 06-07-1976,  MRN: [id number]  Chief Complaint  Patient presents with  . Routine Post Op    PT stated that she is doing well she denies pain at this time and has no major concerns     DOS: 07/12/2020 Procedure: Partial amputation right second toe, flexor tenotomy left second toe  45 y.o. female returns for post-op check.  Doing quite well  Review of Systems: Negative except as noted in the HPI. Denies N/V/F/Ch.   Objective:  There were no vitals filed for this visit. There is no height or weight on file to calculate BMI. Constitutional Well developed. Well nourished.  Vascular Foot warm and well perfused. Capillary refill normal to all digits.   Neurologic Normal speech. Oriented to person, place, and time. Epicritic sensation to light touch grossly absent bilaterally.  Dermatologic Skin healing well without signs of infection. Skin edges well coapted without signs of infection.  Orthopedic: Tenderness to palpation noted about the surgical site.   Radiographs: Status post amputation of right interphalangeal joint distally Assessment:   1. Osteomyelitis of second toe of right foot ([place])   2. Hammertoe of left foot   3. Post-operative state    Plan:  Patient was evaluated and treated and all questions answered.  S/p foot surgery bilaterally -Progressing as expected post-operatively. -WB Status: WBAT in surgical shoe on right, she may return to regular shoes on the left -Sutures: Leaving all sutures intact for 2 more weeks, she may begin bathing on the left side. -Medications: No refills required -Foot redressed.  Return in about 2 weeks (around 08/02/2020) for suture removal.

## 2020-07-21 ENCOUNTER — Encounter: Payer: 59 | Admitting: Podiatry

## 2020-07-26 ENCOUNTER — Encounter: Payer: 59 | Admitting: Podiatry

## 2020-07-28 ENCOUNTER — Encounter: Payer: 59 | Admitting: Podiatry

## 2020-08-02 ENCOUNTER — Ambulatory Visit (INDEPENDENT_AMBULATORY_CARE_PROVIDER_SITE_OTHER): Payer: 59 | Admitting: Podiatry

## 2020-08-02 ENCOUNTER — Other Ambulatory Visit: Payer: Self-pay

## 2020-08-02 DIAGNOSIS — Z9889 Other specified postprocedural states: Secondary | ICD-10-CM

## 2020-08-02 DIAGNOSIS — M2042 Other hammer toe(s) (acquired), left foot: Secondary | ICD-10-CM

## 2020-08-02 DIAGNOSIS — M869 Osteomyelitis, unspecified: Secondary | ICD-10-CM

## 2020-08-04 ENCOUNTER — Encounter: Payer: Self-pay | Admitting: Podiatry

## 2020-08-04 NOTE — Progress Notes (Signed)
  Subjective:  Patient ID: Anne Carney, female    DOB: 07-08-1975,  MRN: [id number]  Chief Complaint  Patient presents with  . Routine Post Op    PT stated that she is doing okay she does have some pain but its mostly at night    DOS: 07/12/2020 Procedure: Partial amputation right second toe, flexor tenotomy left second toe  45 y.o. female returns for post-op check. She is doing well  Review of Systems: Negative except as noted in the HPI. Denies N/V/F/Ch.   Objective:  There were no vitals filed for this visit. There is no height or weight on file to calculate BMI. Constitutional Well developed. Well nourished.  Vascular Foot warm and well perfused. Capillary refill normal to all digits.   Neurologic Normal speech. Oriented to person, place, and time. Epicritic sensation to light touch grossly absent bilaterally.  Dermatologic Skin healing well without signs of infection. Skin edges well coapted without signs of infection.  Orthopedic: Tenderness to palpation noted about the surgical site.   Radiographs: Status post amputation of right interphalangeal joint distally Assessment:   No diagnosis found. Plan:  Patient was evaluated and treated and all questions answered.  S/p foot surgery bilaterally -Progressing as expected post-operatively. -WB Status: May return to regular shoe gear -Sutures: Removed today incisions are well-healed. She should apply Neosporin ointment. -She begin bathing, no soaking or scrubbing -Foot redressed.  Return in about 1 month (around 08/30/2020).

## 2020-08-11 ENCOUNTER — Encounter: Payer: 59 | Admitting: Podiatry

## 2020-08-30 ENCOUNTER — Other Ambulatory Visit: Payer: Self-pay

## 2020-08-30 ENCOUNTER — Ambulatory Visit (INDEPENDENT_AMBULATORY_CARE_PROVIDER_SITE_OTHER): Payer: 59 | Admitting: Podiatry

## 2020-08-30 DIAGNOSIS — M869 Osteomyelitis, unspecified: Secondary | ICD-10-CM

## 2020-08-30 DIAGNOSIS — M2042 Other hammer toe(s) (acquired), left foot: Secondary | ICD-10-CM

## 2020-08-30 DIAGNOSIS — Z9889 Other specified postprocedural states: Secondary | ICD-10-CM

## 2020-08-31 NOTE — Progress Notes (Signed)
  Subjective:  Patient ID: Anne Carney, female    DOB: 09-26-1975,  MRN: [id number]  Chief Complaint  Patient presents with  . Routine Post Op    PT stated that she is doing good she has no concerns at this time    DOS: 07/12/2020 Procedure: Partial amputation right second toe, flexor tenotomy left second toe  45 y.o. female returns for post-op check. She is doing well.  Remains healed  Review of Systems: Negative except as noted in the HPI. Denies N/V/F/Ch.   Objective:  There were no vitals filed for this visit. There is no height or weight on file to calculate BMI. Constitutional Well developed. Well nourished.  Vascular Foot warm and well perfused. Capillary refill normal to all digits.   Neurologic Normal speech. Oriented to person, place, and time. Epicritic sensation to light touch grossly absent bilaterally.  Dermatologic  well-healed surgical incision, left second toe callus is reduced in size  Orthopedic: Tenderness to palpation noted about the surgical site.   Radiographs: Status post amputation of right interphalangeal joint distally Assessment:   1. Osteomyelitis of second toe of right foot ([place])   2. Hammertoe of left foot   3. Post-operative state    Plan:  Patient was evaluated and treated and all questions answered.  S/p foot surgery bilaterally -Doing well without recurrence of ulceration.  Hopefully the toenail left side will prevent the same thing happening as it did on the right side.  Return as needed if further issues or ulcers recur  Return if symptoms worsen or fail to improve.

## 2020-10-23 ENCOUNTER — Observation Stay (HOSPITAL_COMMUNITY)
Admission: EM | Admit: 2020-10-23 | Discharge: 2020-10-24 | Disposition: A | Discharge status: Home / Self Care | Payer: 59 | Attending: Internal Medicine | Admitting: Internal Medicine

## 2020-10-23 ENCOUNTER — Emergency Department (HOSPITAL_COMMUNITY): Payer: 59

## 2020-10-23 ENCOUNTER — Encounter (HOSPITAL_COMMUNITY): Payer: Self-pay

## 2020-10-23 ENCOUNTER — Other Ambulatory Visit: Payer: Self-pay

## 2020-10-23 DIAGNOSIS — I10 Essential (primary) hypertension: Secondary | ICD-10-CM

## 2020-10-23 DIAGNOSIS — N179 Acute kidney failure, unspecified: Secondary | ICD-10-CM | POA: Diagnosis not present

## 2020-10-23 DIAGNOSIS — R42 Dizziness and giddiness: Secondary | ICD-10-CM | POA: Diagnosis not present

## 2020-10-23 DIAGNOSIS — Z79899 Other long term (current) drug therapy: Secondary | ICD-10-CM | POA: Insufficient documentation

## 2020-10-23 DIAGNOSIS — Z794 Long term (current) use of insulin: Secondary | ICD-10-CM | POA: Insufficient documentation

## 2020-10-23 DIAGNOSIS — Z20822 Contact with and (suspected) exposure to covid-19: Secondary | ICD-10-CM | POA: Diagnosis not present

## 2020-10-23 DIAGNOSIS — K219 Gastro-esophageal reflux disease without esophagitis: Secondary | ICD-10-CM

## 2020-10-23 DIAGNOSIS — E1101 Type 2 diabetes mellitus with hyperosmolarity with coma: Secondary | ICD-10-CM | POA: Diagnosis not present

## 2020-10-23 DIAGNOSIS — E1142 Type 2 diabetes mellitus with diabetic polyneuropathy: Secondary | ICD-10-CM

## 2020-10-23 DIAGNOSIS — E871 Hypo-osmolality and hyponatremia: Secondary | ICD-10-CM

## 2020-10-23 LAB — COMPREHENSIVE METABOLIC PANEL
ALT: 17 U/L (ref 0–44)
AST: 20 U/L (ref 15–41)
Albumin: 3.6 g/dL (ref 3.5–5.0)
Alkaline Phosphatase: 144 U/L — ABNORMAL HIGH (ref 38–126)
Anion gap: 10 (ref 5–15)
BUN: 24 mg/dL — ABNORMAL HIGH (ref 6–20)
CO2: 26 mmol/L (ref 22–32)
Calcium: 8.8 mg/dL — ABNORMAL LOW (ref 8.9–10.3)
Chloride: 83 mmol/L — ABNORMAL LOW (ref 98–111)
Creatinine, Ser: 1.19 mg/dL — ABNORMAL HIGH (ref 0.44–1.00)
GFR, Estimated: 57 mL/min — ABNORMAL LOW (ref >60)
Glucose, Bld: 892 mg/dL (ref 70–99)
Potassium: 5 mmol/L (ref 3.5–5.1)
Sodium: 119 mmol/L — CL (ref 135–145)
Total Bilirubin: 0.9 mg/dL (ref 0.3–1.2)
Total Protein: 7.2 g/dL (ref 6.5–8.1)

## 2020-10-23 LAB — URINALYSIS, ROUTINE W REFLEX MICROSCOPIC
Bilirubin Urine: NEGATIVE
Glucose, UA: >=500 mg/dL — AB
Ketones, ur: 5 mg/dL — AB
Leukocytes,Ua: NEGATIVE
Nitrite: NEGATIVE
Protein, ur: NEGATIVE mg/dL
Specific Gravity, Urine: 1.024 (ref 1.005–1.030)
pH: 5 (ref 5.0–8.0)

## 2020-10-23 LAB — OSMOLALITY: Osmolality: 305 mOsm/kg — ABNORMAL HIGH (ref 275–295)

## 2020-10-23 LAB — CBC WITH DIFFERENTIAL/PLATELET
Abs Immature Granulocytes: 0.01 10*3/uL (ref 0.00–0.07)
Basophils Absolute: 0 10*3/uL (ref 0.0–0.1)
Basophils Relative: 1 %
Eosinophils Absolute: 0.1 10*3/uL (ref 0.0–0.5)
Eosinophils Relative: 2 %
HCT: 33.6 % — ABNORMAL LOW (ref 36.0–46.0)
Hemoglobin: 10.7 g/dL — ABNORMAL LOW (ref 12.0–15.0)
Immature Granulocytes: 0 %
Lymphocytes Relative: 25 %
Lymphs Abs: 1.4 10*3/uL (ref 0.7–4.0)
MCH: 25.4 pg — ABNORMAL LOW (ref 26.0–34.0)
MCHC: 31.8 g/dL (ref 30.0–36.0)
MCV: 79.6 fL — ABNORMAL LOW (ref 80.0–100.0)
Monocytes Absolute: 0.4 10*3/uL (ref 0.1–1.0)
Monocytes Relative: 7 %
Neutro Abs: 3.7 10*3/uL (ref 1.7–7.7)
Neutrophils Relative %: 65 %
Platelets: 136 10*3/uL — ABNORMAL LOW (ref 150–400)
RBC: 4.22 MIL/uL (ref 3.87–5.11)
RDW: 14.3 % (ref 11.5–15.5)
WBC: 5.7 10*3/uL (ref 4.0–10.5)
nRBC: 0 % (ref 0.0–0.2)

## 2020-10-23 LAB — BASIC METABOLIC PANEL
Anion gap: 10 (ref 5–15)
Anion gap: 10 (ref 5–15)
BUN: 21 mg/dL — ABNORMAL HIGH (ref 6–20)
BUN: 19 mg/dL (ref 6–20)
CO2: 25 mmol/L (ref 22–32)
CO2: 24 mmol/L (ref 22–32)
Calcium: 8.9 mg/dL (ref 8.9–10.3)
Calcium: 8.7 mg/dL — ABNORMAL LOW (ref 8.9–10.3)
Chloride: 92 mmol/L — ABNORMAL LOW (ref 98–111)
Chloride: 98 mmol/L (ref 98–111)
Creatinine, Ser: 1.11 mg/dL — ABNORMAL HIGH (ref 0.44–1.00)
Creatinine, Ser: 0.9 mg/dL (ref 0.44–1.00)
GFR, Estimated: >60 mL/min (ref >60)
GFR, Estimated: >60 mL/min (ref >60)
Glucose, Bld: 532 mg/dL (ref 70–99)
Glucose, Bld: 104 mg/dL — ABNORMAL HIGH (ref 70–99)
Potassium: 3.6 mmol/L (ref 3.5–5.1)
Potassium: 3.5 mmol/L (ref 3.5–5.1)
Sodium: 127 mmol/L — ABNORMAL LOW (ref 135–145)
Sodium: 132 mmol/L — ABNORMAL LOW (ref 135–145)

## 2020-10-23 LAB — CBG MONITORING, ED
Glucose-Capillary: >600 mg/dL (ref 70–99)
Glucose-Capillary: >600 mg/dL (ref 70–99)
Glucose-Capillary: 510 mg/dL (ref 70–99)
Glucose-Capillary: 326 mg/dL — ABNORMAL HIGH (ref 70–99)

## 2020-10-23 LAB — GLUCOSE, CAPILLARY
Glucose-Capillary: 149 mg/dL — ABNORMAL HIGH (ref 70–99)
Glucose-Capillary: 128 mg/dL — ABNORMAL HIGH (ref 70–99)
Glucose-Capillary: 115 mg/dL — ABNORMAL HIGH (ref 70–99)
Glucose-Capillary: 130 mg/dL — ABNORMAL HIGH (ref 70–99)
Glucose-Capillary: 286 mg/dL — ABNORMAL HIGH (ref 70–99)

## 2020-10-23 LAB — MAGNESIUM: Magnesium: 1.3 mg/dL — ABNORMAL LOW (ref 1.7–2.4)

## 2020-10-23 LAB — RESP PANEL BY RT-PCR (FLU A&B, COVID) ARPGX2
Influenza A by PCR: NEGATIVE
Influenza B by PCR: NEGATIVE
SARS Coronavirus 2 by RT PCR: NEGATIVE

## 2020-10-23 LAB — TROPONIN I (HIGH SENSITIVITY)
Troponin I (High Sensitivity): 4 ng/L (ref <18)
Troponin I (High Sensitivity): 4 ng/L (ref <18)

## 2020-10-23 LAB — POC URINE PREG, ED: Preg Test, Ur: NEGATIVE

## 2020-10-23 LAB — MRSA PCR SCREENING: MRSA by PCR: NEGATIVE

## 2020-10-23 LAB — PHOSPHORUS: Phosphorus: 2.7 mg/dL (ref 2.5–4.6)

## 2020-10-23 MED ORDER — INSULIN GLARGINE 100 UNIT/ML ~~LOC~~ SOLN
12.0000 [IU] | Freq: Two times a day (BID) | SUBCUTANEOUS | Status: DC
  Administered 2020-10-23: 12 [IU] via SUBCUTANEOUS
  Filled 2020-10-23 (×4): qty 0.12
  Filled 2020-10-23: qty 1

## 2020-10-23 MED ORDER — INSULIN REGULAR(HUMAN) IN NACL 100-0.9 UT/100ML-% IV SOLN
INTRAVENOUS | Status: DC
  Filled 2020-10-23: qty 100

## 2020-10-23 MED ORDER — DEXTROSE IN LACTATED RINGERS 5 % IV SOLN: INTRAVENOUS | Status: DC

## 2020-10-23 MED ORDER — SODIUM CHLORIDE 0.9 % IV BOLUS
1000.0000 mL | Freq: Once | INTRAVENOUS | Status: AC
  Administered 2020-10-23: 1000 mL via INTRAVENOUS

## 2020-10-23 MED ORDER — LACTATED RINGERS IV SOLN: INTRAVENOUS | Status: DC

## 2020-10-23 MED ORDER — INSULIN REGULAR(HUMAN) IN NACL 100-0.9 UT/100ML-% IV SOLN
INTRAVENOUS | Status: DC
  Administered 2020-10-23: 11.5 [IU]/h via INTRAVENOUS

## 2020-10-23 MED ORDER — PANTOPRAZOLE SODIUM 40 MG PO TBEC
40.0000 mg | DELAYED_RELEASE_TABLET | Freq: Every day | ORAL | Status: DC
  Administered 2020-10-23 – 2020-10-24 (×2): 40 mg via ORAL
  Filled 2020-10-23 (×2): qty 1

## 2020-10-23 MED ORDER — DEXTROSE 50 % IV SOLN
0.0000 mL | INTRAVENOUS | Status: DC | PRN
Start: 2020-10-23 — End: 2020-10-24

## 2020-10-23 MED ORDER — CHLORHEXIDINE GLUCONATE CLOTH 2 % EX PADS
6.0000 | MEDICATED_PAD | Freq: Every day | CUTANEOUS | Status: DC
  Administered 2020-10-23 – 2020-10-24 (×2): 6 via TOPICAL

## 2020-10-23 MED ORDER — HEPARIN SODIUM (PORCINE) 5000 UNIT/ML IJ SOLN
5000.0000 [IU] | Freq: Three times a day (TID) | INTRAMUSCULAR | Status: DC
  Administered 2020-10-23 – 2020-10-24 (×3): 5000 [IU] via SUBCUTANEOUS
  Filled 2020-10-23 (×3): qty 1

## 2020-10-23 MED ORDER — ACETAMINOPHEN 325 MG PO TABS: 650.0000 mg | ORAL_TABLET | Freq: Four times a day (QID) | ORAL | Status: DC | PRN

## 2020-10-23 MED ORDER — DEXTROSE 50 % IV SOLN: 0.0000 mL | INTRAVENOUS | Status: DC | PRN

## 2020-10-23 MED ORDER — LACTATED RINGERS IV BOLUS
1000.0000 mL | INTRAVENOUS | Status: AC
  Administered 2020-10-23: 1000 mL via INTRAVENOUS

## 2020-10-23 MED ORDER — ONDANSETRON HCL 4 MG/2ML IJ SOLN: 4.0000 mg | Freq: Four times a day (QID) | INTRAMUSCULAR | Status: DC | PRN

## 2020-10-23 MED ORDER — POTASSIUM CHLORIDE 10 MEQ/100ML IV SOLN
10.0000 meq | INTRAVENOUS | Status: AC
  Administered 2020-10-23 (×2): 10 meq via INTRAVENOUS
  Filled 2020-10-23 (×2): qty 100

## 2020-10-23 MED ORDER — INSULIN ASPART 100 UNIT/ML IJ SOLN
2.0000 [IU] | Freq: Three times a day (TID) | INTRAMUSCULAR | Status: DC
  Administered 2020-10-24 (×2): 2 [IU] via SUBCUTANEOUS

## 2020-10-23 MED ORDER — GABAPENTIN 100 MG PO CAPS
100.0000 mg | ORAL_CAPSULE | Freq: Two times a day (BID) | ORAL | Status: DC
  Administered 2020-10-23 – 2020-10-24 (×3): 100 mg via ORAL
  Filled 2020-10-23 (×3): qty 1

## 2020-10-23 MED ORDER — SODIUM CHLORIDE 0.9 % IV SOLN: INTRAVENOUS | Status: DC

## 2020-10-23 MED ORDER — INSULIN ASPART 100 UNIT/ML IJ SOLN
0.0000 [IU] | Freq: Three times a day (TID) | INTRAMUSCULAR | Status: DC
  Administered 2020-10-24 (×2): 11 [IU] via SUBCUTANEOUS

## 2020-10-23 NOTE — ED Triage Notes (Signed)
Pt presents to ED with complaints of dizziness and decreased appetite x 4-5 days.

## 2020-10-23 NOTE — ED Provider Notes (Signed)
[facility] EMERGENCY DEPARTMENT Provider Note   CSN: [id number] Arrival date & time: 10/23/20  3244     History Chief Complaint  Patient presents with  . Dizziness    Anne Carney is a 45 y.o. female.  Patient complains of nausea and dizziness.  No pain no fever no chills  The history is provided by the patient and medical records. No language interpreter was used.  Dizziness Quality:  Lightheadedness Severity:  Mild Onset quality:  Sudden Timing:  Constant Progression:  Waxing and waning Chronicity:  New Context: not when bending over   Relieved by:  Nothing Worsened by:  Nothing Ineffective treatments:  None tried Associated symptoms: no chest pain, no diarrhea and no headaches        Past Medical History:  Diagnosis Date  . Diabetes mellitus   . Hypertension     Patient Active Problem List   Diagnosis Date Noted  . Abdominal wall cellulitis 11/21/2015  . DM type 2 (diabetes mellitus, type 2) ([place]) 11/21/2015  . Abscess 11/21/2015    Past Surgical History:  Procedure Laterality Date  . CESAREAN SECTION       OB History   No obstetric history on file.     Family History  Problem Relation Age of Onset  . Huntington's disease Father   . Asthma Mother   . Diabetes Daughter     Social History   Tobacco Use  . Smoking status: Never Smoker  . Smokeless tobacco: Never Used  Substance Use Topics  . Alcohol use: Yes    Comment: occ.  . Drug use: No    Home Medications Prior to Admission medications   Medication Sig Start Date End Date Taking? Authorizing Provider  furosemide (LASIX) 20 MG tablet Take 20 mg by mouth daily as needed. 09/28/20  Yes [provider]  gabapentin (NEURONTIN) 100 MG capsule Take 100-300 mg by mouth at bedtime as needed. 09/02/20  Yes [provider]  insulin glargine (LANTUS) 100 UNIT/ML injection Inject 35 Units into the skin 2 (two) times daily.    Yes [provider]  JANUMET XR 50-1000  MG TB24 Take 1 tablet by mouth daily. 09/28/20  Yes [provider]  lisinopril-hydrochlorothiazide (ZESTORETIC) 10-12.5 MG tablet Take 1 tablet by mouth daily. 09/02/20  Yes [provider]    Allergies    Patient has no known allergies.  Review of Systems   Review of Systems  Constitutional: Negative for appetite change and fatigue.  HENT: Negative for congestion, ear discharge and sinus pressure.   Eyes: Negative for discharge.  Respiratory: Negative for cough.   Cardiovascular: Negative for chest pain.  Gastrointestinal: Negative for abdominal pain and diarrhea.  Genitourinary: Negative for frequency and hematuria.  Musculoskeletal: Negative for back pain.  Skin: Negative for rash.  Neurological: Positive for dizziness. Negative for seizures and headaches.  Psychiatric/Behavioral: Negative for hallucinations.    Physical Exam Updated Vital Signs BP (!) 163/80   Pulse 93   Temp 98.2 F (36.8 C) (Oral)   Resp 15   Ht 5\' 4"  (1.626 m)   Wt 68 kg   LMP 10/21/2020   SpO2 100%   BMI 25.75 kg/m   Physical Exam Vitals and nursing note reviewed.  Constitutional:      Appearance: She is well-developed.  HENT:     Head: Normocephalic.     Nose: Nose normal.  Eyes:     General: No scleral icterus.    Conjunctiva/sclera: Conjunctivae normal.  Neck:     Thyroid: No thyromegaly.  Cardiovascular:     Rate and Rhythm: Normal rate and regular rhythm.     Heart sounds: No murmur heard. No friction rub. No gallop.   Pulmonary:     Breath sounds: No stridor. No wheezing or rales.  Chest:     Chest wall: No tenderness.  Abdominal:     General: There is no distension.     Tenderness: There is no abdominal tenderness. There is no rebound.  Musculoskeletal:        General: Normal range of motion.     Cervical back: Neck supple.  Lymphadenopathy:     Cervical: No cervical adenopathy.  Skin:    Findings: No erythema or rash.  Neurological:     Mental Status:  She is alert and oriented to person, place, and time.     Motor: No abnormal muscle tone.     Coordination: Coordination normal.  Psychiatric:        Behavior: Behavior normal.     ED Results / Procedures / Treatments   Labs (all labs ordered are listed, but only abnormal results are displayed) Labs Reviewed  CBC WITH DIFFERENTIAL/PLATELET - Abnormal; Notable for the following components:      Result Value   Hemoglobin 10.7 (*)    HCT 33.6 (*)    MCV 79.6 (*)    MCH 25.4 (*)    Platelets 136 (*)    All other components within normal limits  COMPREHENSIVE METABOLIC PANEL - Abnormal; Notable for the following components:   Sodium 119 (*)    Chloride 83 (*)    Glucose, Bld 892 (*)    BUN 24 (*)    Creatinine, Ser 1.19 (*)    Calcium 8.8 (*)    Alkaline Phosphatase 144 (*)    GFR, Estimated 57 (*)    All other components within normal limits  CBG MONITORING, ED - Abnormal; Notable for the following components:   Glucose-Capillary >600 (*)    All other components within normal limits  CBG MONITORING, ED - Abnormal; Notable for the following components:   Glucose-Capillary >600 (*)    All other components within normal limits  RESP PANEL BY RT-PCR (FLU A&B, COVID) ARPGX2  URINALYSIS, ROUTINE W REFLEX MICROSCOPIC  POC URINE PREG, ED  TROPONIN I (HIGH SENSITIVITY)  TROPONIN I (HIGH SENSITIVITY)    EKG EKG Interpretation  Date/Time:  Sunday Oct 23 2020 07:42:33 EDT Ventricular Rate:  98 PR Interval:  151 QRS Duration: 87 QT Interval:  352 QTC Calculation: 450 R Axis:   12 Text Interpretation: Sinus rhythm Low voltage, precordial leads Probable anteroseptal infarct, old ST elevation, consider inferior injury Confirmed by Milton Ferguson [phone]) on 10/23/2020 8:18:03 AM   Radiology CT Head Wo Contrast  Result Date: 10/23/2020 CLINICAL DATA:  Cerebral hemorrhage suspected. Headache and dizziness for 1 week. No known injury. EXAM: CT HEAD WITHOUT CONTRAST TECHNIQUE:  Contiguous axial images were obtained from the base of the skull through the vertex without intravenous contrast. COMPARISON:  04/30/2015 FINDINGS: Brain: No evidence of acute infarction, hemorrhage, hydrocephalus, extra-axial collection or mass lesion/mass effect. Vascular: No hyperdense vessel or unexpected calcification. Skull: Normal. Negative for fracture or focal lesion. Sinuses/Orbits: No acute finding. Other: None. IMPRESSION: No evidence for acute intracranial abnormality. Electronically Signed   By: Nolon Nations M.D.   On: 10/23/2020 09:15   DG Chest Port 1 View  Result Date: 10/23/2020 CLINICAL DATA:  Weakness. Dizziness. Decreased appetite.  Symptoms for 4-5 days. EXAM: PORTABLE CHEST 1 VIEW COMPARISON:  06/30/2014 FINDINGS: The heart size and mediastinal contours are within normal limits. Both lungs are clear. The visualized skeletal structures are unremarkable. IMPRESSION: No active disease. Electronically Signed   By: Nolon Nations M.D.   On: 10/23/2020 09:20    Procedures Procedures   Medications Ordered in ED Medications  insulin regular, human (MYXREDLIN) 100 units/ 100 mL infusion (has no administration in time range)  lactated ringers infusion (has no administration in time range)  dextrose 5 % in lactated ringers infusion (has no administration in time range)  dextrose 50 % solution 0-50 mL (has no administration in time range)  sodium chloride 0.9 % bolus 1,000 mL (0 mLs Intravenous Stopped 10/23/20 1039)    ED Course  I have reviewed the triage vital signs and the nursing notes.  Pertinent labs & imaging results that were available during my care of the patient were reviewed by me and considered in my medical decision making (see chart for details).    CRITICAL CARE Performed by: Milton Ferguson Total critical care time: 40 minutes Critical care time was exclusive of separately billable procedures and treating other patients. Critical care was necessary to treat  or prevent imminent or life-threatening deterioration. Critical care was time spent personally by me on the following activities: development of treatment plan with patient and/or surrogate as well as nursing, discussions with consultants, evaluation of patient's response to treatment, examination of patient, obtaining history from patient or surrogate, ordering and performing treatments and interventions, ordering and review of laboratory studies, ordering and review of radiographic studies, pulse oximetry and re-evaluation of patient's condition.  MDM Rules/Calculators/A&P                          Patient with significant hyperglycemia.  She is placed on insulin drip and will be admitted to medicine Final Clinical Impression(s) / ED Diagnoses Final diagnoses:  Dizziness    Rx / DC Orders ED Discharge Orders    None       Milton Ferguson, MD 10/23/20 1101

## 2020-10-23 NOTE — H&P (Signed)
History and Physical    Anne Carney WUJ:[id number] DOB: July 20, 1975 DOA: 10/23/2020  PCP: Jerel Shepherd, FNP   Patient coming from: Home  I have personally briefly reviewed patient's old medical records in [place]  Chief Complaint: Nausea, vomiting, dizziness and not feeling well.  Elevated CBGs  HPI: Anne Carney is a 45 y.o. female with medical history significant of uncontrolled diabetes mellitus with neuropathy and hypertension; who presented to the emergency department secondary to above-mentioned symptoms.  Patient reports she had not been feeling well for the last 2 days prior to admission and worsening.  Her episode of nausea and vomiting were preventing her to maintain adequate nutrition and hydration and for that she was not using as much insulin as she normally will use.  On the day of admission she fell dizzy, weak all over and noticed elevated CBGs on her glucose monitor at home.  Patient reports no fever, no dysuria, no hematuria, no chest pain, no focal weakness, no sick contacts.  ED Course: In the ED CT head without acute intracranial normalities; patient found with hyperosmolar nonketotic hyperglycemic crisis, pseudohyponatremia and acute kidney injury.  IV fluids and insulin drip initiated; TRH consulted to place in the hospital for further evaluation and management.  Review of Systems: As per HPI otherwise all other systems reviewed and are negative.  Past Medical History:  Diagnosis Date  . Diabetes mellitus   . Hypertension     Past Surgical History:  Procedure Laterality Date  . CESAREAN SECTION      Social History  reports that she has never smoked. She has never used smokeless tobacco. She reports current alcohol use. She reports that she does not use drugs.  No Known Allergies  Family History  Problem Relation Age of Onset  . Huntington's disease Father   . Asthma Mother   . Diabetes Daughter     Prior to Admission medications    Medication Sig Start Date End Date Taking? Authorizing Provider  furosemide (LASIX) 20 MG tablet Take 20 mg by mouth daily as needed. 09/28/20  Yes [provider]  gabapentin (NEURONTIN) 100 MG capsule Take 100-300 mg by mouth at bedtime as needed. 09/02/20  Yes [provider]  insulin glargine (LANTUS) 100 UNIT/ML injection Inject 35 Units into the skin 2 (two) times daily.    Yes [provider]  JANUMET XR 50-1000 MG TB24 Take 1 tablet by mouth daily. 09/28/20  Yes [provider]  lisinopril-hydrochlorothiazide (ZESTORETIC) 10-12.5 MG tablet Take 1 tablet by mouth daily. 09/02/20  Yes [provider]    Physical Exam: Vitals:   10/23/20 0837 10/23/20 0936 10/23/20 1000 10/23/20 1030  BP: (!) 150/82 (!) 143/71 (!) 148/82 (!) 163/80  Pulse: (!) 101 94 91 93  Resp: 13 15 16 15   Temp:      TempSrc:      SpO2: 100% 99% 99% 100%  Weight:      Height:        Constitutional: Afebrile, no chest pain, during my evaluation express just mild nausea but no active vomiting.  Good oxygen saturation on room air. Vitals:   10/23/20 0837 10/23/20 0936 10/23/20 1000 10/23/20 1030  BP: (!) 150/82 (!) 143/71 (!) 148/82 (!) 163/80  Pulse: (!) 101 94 91 93  Resp: 13 15 16 15   Temp:      TempSrc:      SpO2: 100% 99% 99% 100%  Weight:      Height:  Eyes: PERRL, lids and conjunctivae normal; no icterus. ENMT: Mucous membranes are moist. Posterior pharynx clear of any exudate or lesions. Neck: normal, supple, no masses, no thyromegaly, no JVD. Respiratory: clear to auscultation bilaterally, no wheezing, no crackles. Normal respiratory effort. No accessory muscle use.  Cardiovascular: Sinus tachycardia, no murmurs / rubs or gallops. No extremity edema. 2+ pedal pulses. No carotid bruits.  Abdomen: no tenderness, no masses palpated. No hepatosplenomegaly. Bowel sounds positive.  Musculoskeletal: no clubbing / cyanosis. No joint deformity upper and lower  extremities. Good ROM, no contractures. Normal muscle tone.  Skin: no rashes, lesions, ulcers. No induration Neurologic: CN 2-12 grossly intact. Sensation intact, DTR normal. Strength 5/5 in all 4.  Psychiatric: Normal judgment and insight. Alert and oriented x 3. Normal mood.    Labs on Admission: I have personally reviewed following labs and imaging studies  CBC: Recent Labs  Lab 10/23/20 0838  WBC 5.7  NEUTROABS 3.7  HGB 10.7*  HCT 33.6*  MCV 79.6*  PLT 136*    Basic Metabolic Panel: Recent Labs  Lab 10/23/20 0838  NA 119*  K 5.0  CL 83*  CO2 26  GLUCOSE 892*  BUN 24*  CREATININE 1.19*  CALCIUM 8.8*    GFR: Estimated Creatinine Clearance: 56 mL/min (A) (by C-G formula based on SCr of 1.19 mg/dL (H)).  Liver Function Tests: Recent Labs  Lab 10/23/20 0838  AST 20  ALT 17  ALKPHOS 144*  BILITOT 0.9  PROT 7.2  ALBUMIN 3.6    Urine analysis:    Component Value Date/Time   COLORURINE YELLOW 04/27/2020 1012   APPEARANCEUR HAZY (A) 04/27/2020 1012   LABSPEC 1.020 04/27/2020 1012   PHURINE 6.0 04/27/2020 1012   GLUCOSEU >=500 (A) 04/27/2020 1012   HGBUR NEGATIVE 04/27/2020 1012   BILIRUBINUR NEGATIVE 04/27/2020 1012   KETONESUR 20 (A) 04/27/2020 1012   PROTEINUR NEGATIVE 04/27/2020 1012   UROBILINOGEN 0.2 01/29/2015 2334   NITRITE NEGATIVE 04/27/2020 1012   LEUKOCYTESUR TRACE (A) 04/27/2020 1012    Radiological Exams on Admission: CT Head Wo Contrast  Result Date: 10/23/2020 CLINICAL DATA:  Cerebral hemorrhage suspected. Headache and dizziness for 1 week. No known injury. EXAM: CT HEAD WITHOUT CONTRAST TECHNIQUE: Contiguous axial images were obtained from the base of the skull through the vertex without intravenous contrast. COMPARISON:  04/30/2015 FINDINGS: Brain: No evidence of acute infarction, hemorrhage, hydrocephalus, extra-axial collection or mass lesion/mass effect. Vascular: No hyperdense vessel or unexpected calcification. Skull: Normal.  Negative for fracture or focal lesion. Sinuses/Orbits: No acute finding. Other: None. IMPRESSION: No evidence for acute intracranial abnormality. Electronically Signed   By: Nolon Nations M.D.   On: 10/23/2020 09:15   DG Chest Port 1 View  Result Date: 10/23/2020 CLINICAL DATA:  Weakness. Dizziness. Decreased appetite. Symptoms for 4-5 days. EXAM: PORTABLE CHEST 1 VIEW COMPARISON:  06/30/2014 FINDINGS: The heart size and mediastinal contours are within normal limits. Both lungs are clear. The visualized skeletal structures are unremarkable. IMPRESSION: No active disease. Electronically Signed   By: Nolon Nations M.D.   On: 10/23/2020 09:20    EKG: Independently reviewed.  No acute ischemic changes; sinus tachycardia appreciated.  Assessment/Plan 1-hyperosmolar nonketotic hyperglycemic crisis -Anion gap was 10 -Blood sugar on presentation 890 -Patient initiated on insulin drip (endo tool ordered) -Will continue fluid resuscitation and follow CBGs response -As needed antiemetics will be provided -N.p.o. status recommended.  2-nausea/vomiting -In the setting of hyperosmolar state and hyperglycemia -Continue as needed antiemetics.  3-uncontrolled insulin-dependent diabetes  with neuropathy -Continue Neurontin -Will check A1c -Treatment for hypoglycemic event as mentioned above -Holding oral hypoglycemic agents while inpatient.  4-hyponatremia/pseudohyponatremia -In the setting of GI losses and hyperglycemia -Will provide fluid resuscitation and follow electrolytes trend.  5-essential hypertension -Holding diuretics agents currently -Follow vital sign -Continue IV fluids.  6-acute kidney injury -In the setting of prerenal azotemia -Minimize nephrotoxic agents -Provide fluid resuscitation -Follow renal function trend.  DVT prophylaxis: Heparin Code Status:   Full code Family Communication:  No family at bedside. Disposition Plan:   Patient is from:  Home  Anticipated DC  to:  Home  Anticipated DC date:  10/24/2020  Anticipated DC barriers: Control of patient CBGs  Consults called:  Diabetes coordinator Admission status:  Observation, stepdown, length of stay less than 2 midnights.  Severity of Illness: Mild severity currently; but at risk of further decompensation without hospitalization observation.  We will provide aggressive fluid resuscitation and insulin drip.  Follow electrolytes and replete as needed.   Barton Dubois MD [facility]  How to contact the [facility] Attending or Consulting provider [place] or covering provider during after hours [place], for this patient?   1. Check the care team in [facility] and look for a) attending/consulting TRH provider listed and b) the [facility] team listed 2. Log into [email or web address] and use [place]'s universal password to access. If you do not have the password, please contact the hospital operator. 3. Locate the [facility] provider you are looking for under [facility] and page to a number that you can be directly reached. 4. If you still have difficulty reaching the provider, please page the [facility] (Director on Call) for the Hospitalists listed on amion for assistance.  10/23/2020, 10:53 AM

## 2020-10-24 DIAGNOSIS — R42 Dizziness and giddiness: Secondary | ICD-10-CM | POA: Diagnosis not present

## 2020-10-24 DIAGNOSIS — E1101 Type 2 diabetes mellitus with hyperosmolarity with coma: Secondary | ICD-10-CM | POA: Diagnosis not present

## 2020-10-24 DIAGNOSIS — E1142 Type 2 diabetes mellitus with diabetic polyneuropathy: Secondary | ICD-10-CM

## 2020-10-24 DIAGNOSIS — K219 Gastro-esophageal reflux disease without esophagitis: Secondary | ICD-10-CM | POA: Diagnosis not present

## 2020-10-24 DIAGNOSIS — I10 Essential (primary) hypertension: Secondary | ICD-10-CM

## 2020-10-24 LAB — GLUCOSE, CAPILLARY
Glucose-Capillary: 328 mg/dL — ABNORMAL HIGH (ref 70–99)
Glucose-Capillary: 302 mg/dL — ABNORMAL HIGH (ref 70–99)

## 2020-10-24 LAB — HIV ANTIBODY (ROUTINE TESTING W REFLEX): HIV Screen 4th Generation wRfx: NONREACTIVE

## 2020-10-24 LAB — HEMOGLOBIN A1C
Hgb A1c MFr Bld: 14.6 % — ABNORMAL HIGH (ref 4.8–5.6)
Mean Plasma Glucose: 372 mg/dL

## 2020-10-24 LAB — BASIC METABOLIC PANEL
Anion gap: 7 (ref 5–15)
BUN: 15 mg/dL (ref 6–20)
CO2: 26 mmol/L (ref 22–32)
Calcium: 8.5 mg/dL — ABNORMAL LOW (ref 8.9–10.3)
Chloride: 102 mmol/L (ref 98–111)
Creatinine, Ser: 0.7 mg/dL (ref 0.44–1.00)
GFR, Estimated: >60 mL/min (ref >60)
Glucose, Bld: 341 mg/dL — ABNORMAL HIGH (ref 70–99)
Potassium: 4.3 mmol/L (ref 3.5–5.1)
Sodium: 135 mmol/L (ref 135–145)

## 2020-10-24 MED ORDER — MECLIZINE HCL 25 MG PO TABS: 25.0000 mg | ORAL_TABLET | Freq: Three times a day (TID) | ORAL | 0 refills | Status: DC | PRN

## 2020-10-24 MED ORDER — GABAPENTIN 100 MG PO CAPS: 100.0000 mg | ORAL_CAPSULE | Freq: Two times a day (BID) | ORAL | Status: DC

## 2020-10-24 MED ORDER — INSULIN GLARGINE 100 UNIT/ML ~~LOC~~ SOLN: 38.0000 [IU] | Freq: Two times a day (BID) | SUBCUTANEOUS | 11 refills | Status: DC

## 2020-10-24 MED ORDER — PANTOPRAZOLE SODIUM 40 MG PO TBEC: 40.0000 mg | DELAYED_RELEASE_TABLET | Freq: Every day | ORAL | 1 refills | Status: DC

## 2020-10-24 MED ORDER — INSULIN GLARGINE 100 UNIT/ML ~~LOC~~ SOLN
15.0000 [IU] | Freq: Two times a day (BID) | SUBCUTANEOUS | Status: DC
  Administered 2020-10-24: 15 [IU] via SUBCUTANEOUS
  Filled 2020-10-24 (×3): qty 0.15

## 2020-10-24 MED ORDER — BLOOD GLUCOSE MONITOR KIT: PACK | 0 refills | Status: AC

## 2020-10-24 NOTE — Progress Notes (Addendum)
Inpatient Diabetes Program Recommendations  AACE/ADA: New Consensus Statement on Inpatient Glycemic Control   Target Ranges:  Prepandial:   less than 140 mg/dL      Peak postprandial:   less than 180 mg/dL (1-2 hours)      Critically ill patients:  140 - 180 mg/dL  Results for Anne Carney, Anne Carney (MRN [id number]) as of 10/24/2020 07:13  Ref. Range 10/24/2020 04:59  Glucose Latest Ref Range: 70 - 99 mg/dL 341 (H)   Results for Anne Carney, Anne Carney (MRN [id number]) as of 10/24/2020 07:13  Ref. Range 10/23/2020 08:34 10/23/2020 10:26 10/23/2020 11:44 10/23/2020 13:05 10/23/2020 14:32 10/23/2020 15:44 10/23/2020 16:30 10/23/2020 17:28 10/23/2020 20:59  Glucose-Capillary Latest Ref Range: 70 - 99 mg/dL >600 (HH) >600 (HH) 510 (HH) 326 (H) 149 (H) 128 (H) 130 (H) 115 (H) 286 (H)  Results for Anne Carney, Anne Carney (MRN [id number]) as of 10/24/2020 07:13  Ref. Range 10/23/2020 08:38  Glucose Latest Ref Range: 70 - 99 mg/dL 892 (HH)   Review of Glycemic Control  Diabetes history: DM2 Outpatient Diabetes medications: Lantus 35 units BID, Janumet XR 50-1000 mg daily Current orders for Inpatient glycemic control: Lantus 12 units BID, Novolog 0-15 units TID with meals, Novolog 2 units TID with meals  Inpatient Diabetes Program Recommendations:    Insulin: Patient received Lantus 12 units at 18:11 on 10/23/20 and will get Lantus 12 units this morning.   HbgA1C: Current A1C in process.  NOTE: Per chart, patient has DM2 and takes Lantus and Janumet as an outpatient. Per H&P, "Patient reports she had not been feeling well for the last 2 days prior to admission and worsening.  Her episode of nausea and vomiting were preventing her to maintain adequate nutrition and hydration and for that she was not using as much insulin as she normally will use."  Initial glucose 892 mg/dl on 10/23/20 and patient was started on IV insulin which has been transitioned to SQ insulin. Patient received Lanuts 12 units at 18:11 last night and lab glucose  341 mg/dl this morning. Patient will receive dose of Lantus 12 units this morning. Will follow along while inpatient.  Addendum 10/24/20'@13' :30-Spoke with patient over the phone regarding DM control. Patient states that she sees provider at [facility] for DM management. Patient reports she is taking Lantus 35 units BID and Janumet 50-1000 mg BID. Patient reports that she is not checking glucose at home and notes that she has lost her glucometer. Informed patient that I would ask provider to give her Rx for glucose monitoring kit. Also discussed Reli-On glucometer at [facility] which is $9 (in case her copay is too expensive with insurance using prescription).  Patient states that she is able to afford her DM medications. Inquired about most current A1C and patient states that her A1C is usually between 11-12% ranges. Current A1C still in process.  Discussed glucose and A1C goals. Discussed importance of checking CBGs and maintaining good CBG control to prevent long-term and short-term complications. Explained how hyperglycemia leads to damage within blood vessels which lead to the common complications seen with uncontrolled diabetes. Stressed to the patient the importance of improving glycemic control to prevent further complications from uncontrolled diabetes. Encouraged patient to talk with PCP about being referred to an Endocrinologist to assist with getting DM under better control.  Patient states that she needs more Lantus insulin (perfers vials) and that if she is discharged on Novolog she would prefer vials.   Patient verbalized understanding of information discussed and reports no further  questions at this time related to diabetes.   Thanks, Barnie Alderman, RN, MSN, CDE Diabetes Coordinator Inpatient Diabetes Program [phone] (Team Pager from 8am to 5pm)

## 2020-10-24 NOTE — Discharge Summary (Signed)
Physician Discharge Summary  Anne Carney ELF:[id number] DOB: [birth year] DOA: 10/23/2020  PCP: Jerel Shepherd, FNP  Admit date: 10/23/2020 Discharge date: 10/24/2020  Time spent: 35 minutes  Recommendations for Outpatient Follow-up:  Repeat BMET to follow electrolytes and renal function Reassess improvement/resolution of patient's vertigo symptoms Continue closely monitor patient's CBGs with further adjustment to hypoglycemic regimen as required.  Discharge Diagnoses:  Active Problems:   Hyperosmolar (nonketotic) coma (HCC)   Dizziness   Gastroesophageal reflux disease   Primary hypertension   Discharge Condition: Stable and improved.  Discharged home with instruction to follow-up with PCP in 10 days  CODE STATUS: Full code.  Diet recommendation: Heart healthy modified and carbohydrate diet  Filed Weights   10/23/20 0735  Weight: 68 kg    History of present illness:  Anne Carney is a 45 y.o. female with medical history significant of uncontrolled diabetes mellitus with neuropathy and hypertension; who presented to the emergency department secondary to above-mentioned symptoms.  Patient reports she had not been feeling well for the last 2 days prior to admission and worsening.  Her episode of nausea and vomiting were preventing her to maintain adequate nutrition and hydration and for that she was not using as much insulin as she normally will use.  On the day of admission she fell dizzy, weak all over and noticed elevated CBGs on her glucose monitor at home.  Patient reports no fever, no dysuria, no hematuria, no chest pain, no focal weakness, no sick contacts.  ED Course: In the ED CT head without acute intracranial normalities; patient found with hyperosmolar nonketotic hyperglycemic crisis, pseudohyponatremia and acute kidney injury.  IV fluids and insulin drip initiated; TRH consulted to place in the hospital for further evaluation and management.  Hospital Course:   Nonketotic hyperglycemic crisis -resolved after IV fluid insulin treatment  -At discharge CBGs was stable in the 300 range (this is baseline for her) -Electrolytes, renal function and vital signs within normal limits. -Patient's Lantus dose has been adjusted and patient instructed to be compliant with medications and low carb diet.  Nausea/vomiting -In the setting of hyperosmolar state -Resolved -Advised to maintain adequate hydration continue  Hyponatremia/pseudohyponatremia -in the setting of hyperglycemia, dehydration and GI losses -Improved and WNL at discharge -Patient advised to maintain adequate hydration -Continue to properly control blood sugar level.  Hypertension -Resume home antihypertensive agents -patient advised to follow heart healthy diet  Acute kidney injury -Prerenal in nature, secondary to dehydration -Resolved and back to normal after IVF's resuscitation provided -Repeat basic metabolic panel follow-up visit to reassess renal function Stability.  Dizziness/vertigo -discharge with meclizine prescription and instructions for outpatient PT for vestibular training.    Procedures:  See below for x-ray reports   Consultations:  None   Discharge Exam: Vitals:   10/24/20 0727 10/24/20 1113  BP:    Pulse: 82   Resp:    Temp: 98.7 F (37.1 C) 98.7 F (37.1 C)  SpO2: 99%     General: Afebrile, no chest pain, no nausea, no vomiting.  Reports Slight woozy sensation when changing positions morning, but feels ready to go home. Cardiovascular: S1 and S2, no rubs, no gallops, no JVD. Respiratory: Clear to auscultation bilaterally; using accessory muscles.  Good saturation on room air Abdomen: Soft, nontender, positive bowel sounds Extremities: No cyanosis or clubbing.  Discharge Instructions   Discharge Instructions    Ambulatory referral to Physical Therapy   Complete by: As directed    Diet - low sodium heart  healthy   Complete by: As directed     Diet Carb Modified   Complete by: As directed    Discharge instructions   Complete by: As directed    Take medications as prescribed Maintain adequate hydration Current follow-up with PCP in 10 days Avoid skipping meals And control the amount of sugar ingested.   Increase activity slowly   Complete by: As directed      Allergies as of 10/24/2020   No Known Allergies     Medication List    TAKE these medications   furosemide 20 MG tablet Commonly known as: LASIX Take 20 mg by mouth daily as needed.   gabapentin 100 MG capsule Commonly known as: NEURONTIN Take 1 capsule (100 mg total) by mouth 2 (two) times daily. What changed:   how much to take  when to take this  reasons to take this   insulin glargine 100 UNIT/ML injection Commonly known as: LANTUS Inject 0.38 mLs (38 Units total) into the skin 2 (two) times daily. What changed: how much to take   Janumet XR 50-1000 MG Tb24 Generic drug: SitaGLIPtin-MetFORMIN HCl Take 1 tablet by mouth daily.   lisinopril-hydrochlorothiazide 10-12.5 MG tablet Commonly known as: ZESTORETIC Take 1 tablet by mouth daily.   meclizine 25 MG tablet Commonly known as: ANTIVERT Take 1 tablet (25 mg total) by mouth every 8 (eight) hours as needed for dizziness.   pantoprazole 40 MG tablet Commonly known as: PROTONIX Take 1 tablet (40 mg total) by mouth daily. Start taking on: Oct 25, 2020      No Known Allergies  Follow-up Information    Belenda Cruise, [name], FNP. Schedule an appointment as soon as possible for a visit in 10 day(s).   Specialty: Family Medicine Contact information: [address] [place] [address] [phone]               The results of significant diagnostics from this hospitalization (including imaging, microbiology, ancillary and laboratory) are listed below for reference.    Significant Diagnostic Studies: CT Head Wo Contrast  Result Date: 10/23/2020 CLINICAL DATA:   Cerebral hemorrhage suspected. Headache and dizziness for 1 week. No known injury. EXAM: CT HEAD WITHOUT CONTRAST TECHNIQUE: Contiguous axial images were obtained from the base of the skull through the vertex without intravenous contrast. COMPARISON:  04/30/2015 FINDINGS: Brain: No evidence of acute infarction, hemorrhage, hydrocephalus, extra-axial collection or mass lesion/mass effect. Vascular: No hyperdense vessel or unexpected calcification. Skull: Normal. Negative for fracture or focal lesion. Sinuses/Orbits: No acute finding. Other: None. IMPRESSION: No evidence for acute intracranial abnormality. Electronically Signed   By: Nolon Nations M.D.   On: 10/23/2020 09:15   DG Chest Port 1 View  Result Date: 10/23/2020 CLINICAL DATA:  Weakness. Dizziness. Decreased appetite. Symptoms for 4-5 days. EXAM: PORTABLE CHEST 1 VIEW COMPARISON:  06/30/2014 FINDINGS: The heart size and mediastinal contours are within normal limits. Both lungs are clear. The visualized skeletal structures are unremarkable. IMPRESSION: No active disease. Electronically Signed   By: Nolon Nations M.D.   On: 10/23/2020 09:20   Microbiology: Recent Results (from the past 240 hour(s))  Resp Panel by RT-PCR (Flu A&B, Covid) Nasopharyngeal Swab     Status: None   Collection Time: 10/23/20 11:30 AM   Specimen: Nasopharyngeal Swab; Nasopharyngeal(NP) swabs in vial transport medium  Result Value Ref Range Status   SARS Coronavirus 2 by RT PCR NEGATIVE NEGATIVE Final    Comment: (NOTE) SARS-CoV-2 target nucleic  acids are NOT DETECTED.  The SARS-CoV-2 RNA is generally detectable in upper respiratory specimens during the acute phase of infection. The lowest concentration of SARS-CoV-2 viral copies this assay can detect is 138 copies/mL. A negative result does not preclude SARS-Cov-2 infection and should not be used as the sole basis for treatment or other patient management decisions. A negative result may occur with  improper  specimen collection/handling, submission of specimen other than nasopharyngeal swab, presence of viral mutation(s) within the areas targeted by this assay, and inadequate number of viral copies(<138 copies/mL). A negative result must be combined with clinical observations, patient history, and epidemiological information. The expected result is Negative.  Fact Sheet for Patients:  EntrepreneurPulse.com.au  Fact Sheet for Healthcare Providers:  IncredibleEmployment.be  This test is no t yet approved or cleared by the [place] FDA and  has been authorized for detection and/or diagnosis of SARS-CoV-2 by FDA under an Emergency Use Authorization (EUA). This EUA will remain  in effect (meaning this test can be used) for the duration of the COVID-19 declaration under Section 564(b)(1) of the Act, 21 U.S.C.section 360bbb-3(b)(1), unless the authorization is terminated  or revoked sooner.       Influenza A by PCR NEGATIVE NEGATIVE Final   Influenza B by PCR NEGATIVE NEGATIVE Final    Comment: (NOTE) The Xpert Xpress SARS-CoV-2/FLU/RSV plus assay is intended as an aid in the diagnosis of influenza from Nasopharyngeal swab specimens and should not be used as a sole basis for treatment. Nasal washings and aspirates are unacceptable for Xpert Xpress SARS-CoV-2/FLU/RSV testing.  Fact Sheet for Patients: EntrepreneurPulse.com.au  Fact Sheet for Healthcare Providers: IncredibleEmployment.be  This test is not yet approved or cleared by the [place] FDA and has been authorized for detection and/or diagnosis of SARS-CoV-2 by FDA under an Emergency Use Authorization (EUA). This EUA will remain in effect (meaning this test can be used) for the duration of the COVID-19 declaration under Section 564(b)(1) of the Act, 21 U.S.C. section 360bbb-3(b)(1), unless the authorization is terminated or revoked.  Performed at  [facility], [address] [place], [place] [address]   MRSA PCR Screening     Status: None   Collection Time: 10/23/20 12:00 PM   Specimen: Nasal Mucosa; Nasopharyngeal  Result Value Ref Range Status   MRSA by PCR NEGATIVE NEGATIVE Final    Comment:        The GeneXpert MRSA Assay (FDA approved for NASAL specimens only), is one component of a comprehensive MRSA colonization surveillance program. It is not intended to diagnose MRSA infection nor to guide or monitor treatment for MRSA infections. Performed at [facility], [address] [place], [place] [address]      Labs: Basic Metabolic Panel: Recent Labs  Lab 10/23/20 0838 10/23/20 1137 10/23/20 1443 10/24/20 0459  NA 119* 127* 132* 135  K 5.0 3.6 3.5 4.3  CL 83* 92* 98 102  CO2 26 25 24 26   GLUCOSE 892* 532* 104* 341*  BUN 24* 21* 19 15  CREATININE 1.19* 1.11* 0.90 0.70  CALCIUM 8.8* 8.9 8.7* 8.5*  MG  --  1.3*  --   --   PHOS  --  2.7  --   --    Liver Function Tests: Recent Labs  Lab 10/23/20 0838  AST 20  ALT 17  ALKPHOS 144*  BILITOT 0.9  PROT 7.2  ALBUMIN 3.6   CBC: Recent Labs  Lab 10/23/20 0838  WBC 5.7  NEUTROABS 3.7  HGB  10.7*  HCT 33.6*  MCV 79.6*  PLT 136*    CBG: Recent Labs  Lab 10/23/20 1630 10/23/20 1728 10/23/20 2059 10/24/20 0729 10/24/20 1112  GLUCAP 130* 115* 286* 328* 302*    Signed:  Barton Dubois MD.  [facility] 10/24/2020, 1:07 PM

## 2020-10-24 NOTE — TOC Transition Note (Signed)
Transition of Care [facility]) - CM/SW Discharge Note   Patient Details  Name: Becki Mccaskill MRN: [id number] Date of Birth: 1975/06/18  Transition of Care [facility]) CM/SW Contact:  Natasha Bence, LCSW Phone Number: 10/24/2020, 10:39 AM   Clinical Narrative:    CSW notified of patient's readiness for discharge and OPPT needs. CSW referred patient to [facility] OPPT. TOC signing off.   Final next level of care: OP Rehab Barriers to Discharge: Barriers Resolved   Patient Goals and CMS Choice Patient states their goals for this hospitalization and ongoing recovery are:: Return home with OPPT CMS Medicare.gov Compare Post Acute Care list provided to:: Patient Choice offered to / list presented to : Patient  Discharge Placement                    Patient and family notified of of transfer: 10/24/20  Discharge Plan and Services                DME Arranged: N/A DME Agency: NA       HH Arranged: NA HH Agency: NA        Social Determinants of Health ([place]) Interventions     Readmission Risk Interventions No flowsheet data found.

## 2020-11-03 ENCOUNTER — Other Ambulatory Visit: Payer: Self-pay

## 2020-11-03 ENCOUNTER — Ambulatory Visit
Admission: EM | Admit: 2020-11-03 | Discharge: 2020-11-03 | Disposition: A | Discharge status: Home / Self Care | Payer: 59 | Attending: Emergency Medicine | Admitting: Emergency Medicine

## 2020-11-03 ENCOUNTER — Encounter: Payer: Self-pay | Admitting: Emergency Medicine

## 2020-11-03 DIAGNOSIS — H6121 Impacted cerumen, right ear: Secondary | ICD-10-CM | POA: Diagnosis not present

## 2020-11-03 DIAGNOSIS — R42 Dizziness and giddiness: Secondary | ICD-10-CM

## 2020-11-03 NOTE — ED Provider Notes (Signed)
[place]   [id number] 11/03/20 Arrival Time: 1656  PP:[id number]  SUBJECTIVE:  Anne Carney is a 45 y.o. female who presents with complaint of dizziness that began 3-4 days ago.  Denies a precipitating event, trauma, or recent URI within the past month.  Describes the dizziness as "unsteady to walk." States that it is intermittent with episodes lasting a couple of minutes.  Was seen in the ED and prescribed meclizine with minimal relief.  Denies aggravating factors.  Denies previous symptoms.  Complains of nausea and vomiting (related to acid reflux), "water" in ear, incoordination.  Denies fever, chills, hearing changes, tinnitus, ear pain, chest pain, syncope, SOB, weakness, slurred speech, memory or emotional changes, facial drooping/ asymmetry, incoordination, numbness or tingling, abdominal pain, changes in bowel or bladder habits.    ROS: As per HPI.  All other pertinent ROS negative.    Past Medical History:  Diagnosis Date  . Diabetes mellitus   . Hypertension    Past Surgical History:  Procedure Laterality Date  . CESAREAN SECTION     No Known Allergies No current facility-administered medications on file prior to encounter.   Current Outpatient Medications on File Prior to Encounter  Medication Sig Dispense Refill  . blood glucose meter kit and supplies KIT Dispense based on patient and insurance preference. Use to check sugar up ro four times daily as directed. 1 each 0  . furosemide (LASIX) 20 MG tablet Take 20 mg by mouth daily as needed.    . gabapentin (NEURONTIN) 100 MG capsule Take 1 capsule (100 mg total) by mouth 2 (two) times daily.    . insulin glargine (LANTUS) 100 UNIT/ML injection Inject 0.38 mLs (38 Units total) into the skin 2 (two) times daily. 10 mL 11  . JANUMET XR 50-1000 MG TB24 Take 1 tablet by mouth daily.    Marland Kitchen lisinopril-hydrochlorothiazide (ZESTORETIC) 10-12.5 MG tablet Take 1 tablet by mouth daily.    . meclizine (ANTIVERT) 25 MG  tablet Take 1 tablet (25 mg total) by mouth every 8 (eight) hours as needed for dizziness. 30 tablet 0  . pantoprazole (PROTONIX) 40 MG tablet Take 1 tablet (40 mg total) by mouth daily. 30 tablet 1   Social History   Socioeconomic History  . Marital status: Divorced    Spouse name: Not on file  . Number of children: Not on file  . Years of education: Not on file  . Highest education level: Not on file  Occupational History  . Not on file  Tobacco Use  . Smoking status: Never Smoker  . Smokeless tobacco: Never Used  Substance and Sexual Activity  . Alcohol use: Yes    Comment: occ.  . Drug use: No  . Sexual activity: Yes    Birth control/protection: None  Other Topics Concern  . Not on file  Social History Narrative  . Not on file   Social Determinants of Health   Financial Resource Strain: Not on file  Food Insecurity: Not on file  Transportation Needs: Not on file  Physical Activity: Not on file  Stress: Not on file  Social Connections: Not on file  Intimate Partner Violence: Not on file   Family History  Problem Relation Age of Onset  . Huntington's disease Father   . Asthma Mother   . Diabetes Daughter     OBJECTIVE:  Vitals:   11/03/20 1749  BP: 129/78  Pulse: (!) 117  Resp: 17  Temp: 98.3 F (36.8 C)  TempSrc: Oral  SpO2: 99%    General appearance: alert; no distress Eyes: PERRLA; EOMI; conjunctiva normal HENT: normocephalic; atraumatic; RT EAC obstructed with cerumen; LT TM normal; nasal mucosa normal; oral mucosa normal Neck: supple with FROM Lungs: clear to auscultation bilaterally Heart: regular rate and rhythm Abdomen: soft, non-tender; bowel sounds normal Extremities: no cyanosis or edema; symmetrical with no gross deformities Skin: warm and dry Neurologic: normal gait; negative pronator drift, finger to nose without deficit; CN 2-12 grossly intact Psychological: alert and cooperative; normal mood and affect   PROCEDURE:  Consent  granted.  Right ear lavage performed by RT.  TM visualized.  PT tolerated procedure well.      ASSESSMENT & PLAN:  1. Right ear impacted cerumen   2. Dizziness and giddiness     No orders of the defined types were placed in this encounter.  Unable to rule out stroke in urgent care setting.  Offered patient further evaluation and management in the ED.  Patient declines at this time and would like to try outpatient therapy first.  Aware of the risk associated with this decision including missed diagnosis, organ damage, organ failure, and/or death.  Patient aware and in agreement.     Ear lavage performed with relief of symptoms Continue with meclizine as prescribed Follow up with PCP as needed Go to the ED if symptoms worsen or you experience headache, blurred vision, dizziness, fainting, slurred speech, facial droop, nausea, vomiting, weakness, chest pain, shortness of breath, etc...  Reviewed expectations re: course of current medical issues. Questions answered. Outlined signs and symptoms indicating need for more acute intervention. Patient verbalized understanding. After Visit Summary given.    Lestine Box, PA-C 11/03/20 1911

## 2020-11-03 NOTE — ED Triage Notes (Addendum)
Pt seen at ED on 10/23/20 for dizziness.  Given medication that helped but is having some dizziness  and blurry vision for the past 3 days.   Pt checked blood sugar and it was in the 100's this morning.  Having trouble hearing out of RT ear and feels like fluid is in the ear.

## 2020-11-03 NOTE — Discharge Instructions (Signed)
Unable to rule out stroke in urgent care setting.  Offered patient further evaluation and management in the ED.  Patient declines at this time and would like to try outpatient therapy first.  Aware of the risk associated with this decision including missed diagnosis, organ damage, organ failure, and/or death.  Patient aware and in agreement.     Ear lavage performed with relief of symptoms Continue with meclizine as prescribed Follow up with PCP as needed Go to the ED if symptoms worsen or you experience headache, blurred vision, dizziness, fainting, slurred speech, facial droop, nausea, vomiting, weakness, chest pain, shortness of breath, etc..Anne Carney

## 2020-11-14 ENCOUNTER — Encounter (HOSPITAL_COMMUNITY): Payer: Self-pay | Admitting: Physical Therapy

## 2020-11-14 ENCOUNTER — Other Ambulatory Visit: Payer: Self-pay

## 2020-11-14 ENCOUNTER — Ambulatory Visit (HOSPITAL_COMMUNITY): Payer: 59 | Attending: Internal Medicine | Admitting: Physical Therapy

## 2020-11-14 DIAGNOSIS — R262 Difficulty in walking, not elsewhere classified: Secondary | ICD-10-CM | POA: Insufficient documentation

## 2020-11-14 DIAGNOSIS — R42 Dizziness and giddiness: Secondary | ICD-10-CM | POA: Insufficient documentation

## 2020-11-14 NOTE — Patient Instructions (Signed)
Access Code: [email or web address] URL: [email or web address] Date: 11/14/2020 Prepared by: Yetta Glassman  Exercises Seated Gaze Stabilization with Head Rotation and Horizontal Arm Movement - 1 x daily - 7 x weekly - 3 sets - 10 reps Tandem Stance - 3 reps - 30 hold Seated Cervical Rotation AROM - 3 sets - 10 reps Standing Upper Cervical Flexion and Extension - 3 sets - 10 reps

## 2020-11-14 NOTE — Therapy (Signed)
[facility] [address] [place], [place], [address] Phone: [phone]   Fax:  [phone]  Physical Therapy Evaluation  Patient Details  Name: Anne Carney MRN: [id number] Date of Birth: [birth year] Referring Provider (PT): Barton Dubois MD   Encounter Date: 11/14/2020   PT End of Session - 11/14/20 1539    Visit Number 1    Number of Visits 1    Date for PT Re-Evaluation 11/21/20    Authorization Type [employer], 30 combined of PT and OT with 0 used, no auth    Authorization - Visit Number 1    Authorization - Number of Visits 30    PT Start Time 5631   pt late to session   PT Stop Time 1611    PT Time Calculation (min) 31 min    Activity Tolerance Patient tolerated treatment well    Behavior During Therapy [facility] for tasks assessed/performed           Past Medical History:  Diagnosis Date  . Diabetes mellitus   . Hypertension     Past Surgical History:  Procedure Laterality Date  . CESAREAN SECTION      There were no vitals filed for this visit.    Subjective Assessment - 11/14/20 1544    Subjective Patient with dizziness that started on 10/23/20 where she went to the ED. It got better but then returned for 3 days where she returned to the urgent care on 5/26. Reports that they gave her meclizine and that helped a bit. CT was negative for stroke. No injury noted. States that when she looks at monitors and a lot of orders come in she feels dizzy and it looks blurry. Reports no symptoms at rest. States last time she had her eyes checked over a year ago. Reports dizziness is worse when she bends over. States staying still and closing her eyes make if feel better. Current dizziness is 2/10. Symptoms last no more than an hour    Pertinent History HTN, DB,    Currently in Pain? Yes    Pain Score 2    dizziness             [facility] PT Assessment - 11/14/20 0001      Assessment   Medical Diagnosis dizziness    Referring  Provider (PT) Barton Dubois MD    Next MD Visit 11/18/20    Prior Therapy no      Balance Screen   Has the patient fallen in the past 6 months No      Prior Function   Level of Independence Independent    Vocation Full time employment      Cognition   Overall Cognitive Status Within Functional Limits for tasks assessed      Observation/Other Assessments   Focus on Therapeutic Outcomes (FOTO)  50% function      Observation/Other Assessments-Edema    Edema --   swelling in both legs, no pitting edema     Balance   Balance Assessed Yes      Static Standing Balance   Static Standing - Balance Support No upper extremity supported    Static Standing - Level of Assistance 7: Independent    Static Standing Balance -  Activities  Single Leg Stance - Right Leg;Single Leg Stance - Left Leg;Tandam Stance - Right Leg;Tandam Stance - Left Leg    Static Standing - Comment/# of Minutes single leg stance left 19 seconds, Right 17 seconds; tandem  right posterior 30seconds, and left posterior 27 seconds                  Vestibular Assessment - 11/14/20 0001      Symptom Behavior   Type of Dizziness  Blurred vision;"Funny feeling in head";Unsteady with head/body turns;Lightheadedness    Frequency of Dizziness every other day    Duration of Dizziness < 1 hour    Symptom Nature Spontaneous;Intermittent    Aggravating Factors Activity in general;Looking up to the ceiling;Moving eyes;Turning head quickly;Forward bending    Relieving Factors Head stationary;Closing eyes;Slow movements;Rest    Progression of Symptoms Better    History of similar episodes 3+ times   in the past year     Oculomotor Exam   Head shaking Horizontal Absent    Smooth Pursuits Intact   no change in symptoms.   Saccades Intact   no change in symptoms     Oculomotor Exam-Fixation Suppressed    Head Shaking Nystagmus-Horizontal negative      Positional Testing   Dix-Hallpike Dix-Hallpike Right;Dix-Hallpike Left       Dix-Hallpike Right   Dix-Hallpike Right Symptoms No nystagmus   no change in symptoms     Dix-Hallpike Left   Dix-Hallpike Left Symptoms No nystagmus   no change in symptoms     Positional Sensitivities   Sit to Supine No dizziness    Supine to Left Side No dizziness    Supine to Right Side No dizziness    Supine to Sitting No dizziness    Right Hallpike No dizziness    Up from Right Hallpike No dizziness    Up from Left Hallpike No dizziness    Head Turning x 5 No dizziness    Head Nodding x 5 No dizziness    Positional Sensitivities Comments bending forward - improved sitting.              Objective measurements completed on examination: See above findings.               PT Education - 11/14/20 1604    Education Details on FOTO score, on current presentation.on HEP    Person(s) Educated Patient    Methods Explanation    Comprehension Verbalized understanding            PT Short Term Goals - 11/14/20 1614      PT SHORT TERM GOAL #1   Title Patient will be independent in self management strategies to improve quality of life and functional outcomes.    Time 1    Period Weeks    Status New    Target Date 11/21/20                     Plan - 11/14/20 1613    Clinical Impression Statement Patient present with complaints of blurred vision and dizziness that is not reproduced or changed with positional movements. Slight increase in symptoms noted with VOR exercises. Patient would like to trial home exercise program at this time. Educated patient in repeated movements as well as VOR exercise. Answered all questions prior to end or session. Improvement in overall symptoms noted end of session. Advised patient to follow up with eye MD as she is overdue. Patient to DC to HEP secondary to one time visit.    Personal Factors and Comorbidities Comorbidity 1;Comorbidity 2    Comorbidities vision issues, DB, HTN    Examination-Activity Limitations  Stand;Lift;Locomotion Level;Transfers    Examination-Participation Restrictions Occupation  Stability/Clinical Decision Making Stable/Uncomplicated    Clinical Decision Making Low    Rehab Potential Fair    PT Frequency One time visit    PT Treatment/Interventions ADLs/Self Care Home Management;Therapeutic exercise    PT Next Visit Plan one time visit    PT Home Exercise Plan VOR x1, head nods, tandem, head rotation    Consulted and Agree with Plan of Care Patient           Patient will benefit from skilled therapeutic intervention in order to improve the following deficits and impairments:  Dizziness,Decreased balance,Difficulty walking  Visit Diagnosis: Dizziness and giddiness  Difficulty in walking, not elsewhere classified     Problem List Patient Active Problem List   Diagnosis Date Noted  . Dizziness   . Gastroesophageal reflux disease   . Primary hypertension   . 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 Abdominal wall cellulitis 11/21/2015  . DM type 2 (diabetes mellitus, type 2) ([place]) 11/21/2015  . Abscess 11/21/2015   5:10 PM, 11/14/20 Jerene Pitch, DPT Physical Therapy with [facility]  [phone] office  [facility] [address] [place], [place], [address] Phone: [phone]   Fax:  [phone]  Name: Chandi Nicklin MRN: [id number] Date of Birth: 01-29-1976

## 2020-12-24 ENCOUNTER — Emergency Department (HOSPITAL_COMMUNITY)
Admission: EM | Admit: 2020-12-24 | Discharge: 2020-12-24 | Disposition: A | Discharge status: Home / Self Care | Payer: 59 | Attending: Emergency Medicine | Admitting: Emergency Medicine

## 2020-12-24 ENCOUNTER — Encounter (HOSPITAL_COMMUNITY): Payer: Self-pay | Admitting: Emergency Medicine

## 2020-12-24 ENCOUNTER — Other Ambulatory Visit: Payer: Self-pay

## 2020-12-24 DIAGNOSIS — Z794 Long term (current) use of insulin: Secondary | ICD-10-CM | POA: Diagnosis not present

## 2020-12-24 DIAGNOSIS — I10 Essential (primary) hypertension: Secondary | ICD-10-CM | POA: Diagnosis not present

## 2020-12-24 DIAGNOSIS — E860 Dehydration: Secondary | ICD-10-CM

## 2020-12-24 DIAGNOSIS — E119 Type 2 diabetes mellitus without complications: Secondary | ICD-10-CM | POA: Diagnosis not present

## 2020-12-24 DIAGNOSIS — R42 Dizziness and giddiness: Secondary | ICD-10-CM | POA: Diagnosis present

## 2020-12-24 DIAGNOSIS — Z7984 Long term (current) use of oral hypoglycemic drugs: Secondary | ICD-10-CM | POA: Insufficient documentation

## 2020-12-24 DIAGNOSIS — Z79899 Other long term (current) drug therapy: Secondary | ICD-10-CM | POA: Insufficient documentation

## 2020-12-24 LAB — CBC WITH DIFFERENTIAL/PLATELET
Abs Immature Granulocytes: 0.03 K/uL (ref 0.00–0.07)
Basophils Absolute: 0.1 K/uL (ref 0.0–0.1)
Basophils Relative: 1 %
Eosinophils Absolute: 0.2 K/uL (ref 0.0–0.5)
Eosinophils Relative: 3 %
HCT: 32.2 % — ABNORMAL LOW (ref 36.0–46.0)
Hemoglobin: 10 g/dL — ABNORMAL LOW (ref 12.0–15.0)
Immature Granulocytes: 0 %
Lymphocytes Relative: 17 %
Lymphs Abs: 1.5 K/uL (ref 0.7–4.0)
MCH: 25.5 pg — ABNORMAL LOW (ref 26.0–34.0)
MCHC: 31.1 g/dL (ref 30.0–36.0)
MCV: 82.1 fL (ref 80.0–100.0)
Monocytes Absolute: 0.6 K/uL (ref 0.1–1.0)
Monocytes Relative: 7 %
Neutro Abs: 6.3 K/uL (ref 1.7–7.7)
Neutrophils Relative %: 72 %
Platelets: 158 K/uL (ref 150–400)
RBC: 3.92 MIL/uL (ref 3.87–5.11)
RDW: 15 % (ref 11.5–15.5)
WBC: 8.8 K/uL (ref 4.0–10.5)
nRBC: 0 % (ref 0.0–0.2)

## 2020-12-24 LAB — COMPREHENSIVE METABOLIC PANEL WITH GFR
ALT: 25 U/L (ref 0–44)
AST: 50 U/L — ABNORMAL HIGH (ref 15–41)
Albumin: 3.2 g/dL — ABNORMAL LOW (ref 3.5–5.0)
Alkaline Phosphatase: 223 U/L — ABNORMAL HIGH (ref 38–126)
Anion gap: 8 (ref 5–15)
BUN: 33 mg/dL — ABNORMAL HIGH (ref 6–20)
CO2: 28 mmol/L (ref 22–32)
Calcium: 8.9 mg/dL (ref 8.9–10.3)
Chloride: 99 mmol/L (ref 98–111)
Creatinine, Ser: 1.29 mg/dL — ABNORMAL HIGH (ref 0.44–1.00)
GFR, Estimated: 52 mL/min — ABNORMAL LOW (ref >60)
Glucose, Bld: 74 mg/dL (ref 70–99)
Potassium: 4.2 mmol/L (ref 3.5–5.1)
Sodium: 135 mmol/L (ref 135–145)
Total Bilirubin: 0.4 mg/dL (ref 0.3–1.2)
Total Protein: 7.2 g/dL (ref 6.5–8.1)

## 2020-12-24 LAB — CBG MONITORING, ED: Glucose-Capillary: 67 mg/dL — ABNORMAL LOW (ref 70–99)

## 2020-12-24 MED ORDER — SODIUM CHLORIDE 0.9 % IV BOLUS
500.0000 mL | Freq: Once | INTRAVENOUS | Status: AC
  Administered 2020-12-24: 500 mL via INTRAVENOUS

## 2020-12-24 MED ORDER — SODIUM CHLORIDE 0.9 % IV BOLUS
1000.0000 mL | Freq: Once | INTRAVENOUS | Status: AC
  Administered 2020-12-24: 1000 mL via INTRAVENOUS

## 2020-12-24 NOTE — Discharge Instructions (Addendum)
Drink plenty of fluids.  Follow-up Tuesday as planned with your doctor.  You can give your doctor a copy of the lab work we did

## 2020-12-24 NOTE — ED Notes (Signed)
Ginger ale given to pt to drink

## 2020-12-24 NOTE — ED Provider Notes (Signed)
Anne Carney Provider Note   CSN: [id number] Arrival date & time: 12/24/20  9767     History Chief Complaint  Patient presents with   Dizziness    C/o dizziness for one week.  Seen in [place] on Thursday, told BP was low.      Anne Carney is a 45 y.o. female.  Patient complains of some dizziness.  Patient has a history of vertigo and takes meclizine for that.  The history is provided by the patient and medical records. No language interpreter was used.  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     Family History  Problem Relation Age of Onset   Huntington's disease Father    Asthma Mother    Diabetes Daughter     Social History   Tobacco Use   Smoking status: Never   Smokeless tobacco: Never  Substance Use Topics   Alcohol use: Yes    Comment: occ.   Drug use: No    Home Medications Prior to Admission medications   Medication Sig Start Date End Date Taking? Authorizing Provider  blood glucose meter kit and supplies KIT Dispense based on patient and insurance preference. Use to check sugar up ro four times daily as directed. 10/24/20   Barton Dubois, MD  furosemide (LASIX) 20 MG  tablet Take 20 mg by mouth daily as needed. 09/28/20   [provider]  gabapentin (NEURONTIN) 100 MG capsule Take 1 capsule (100 mg total) by mouth 2 (two) times daily. 10/24/20   Barton Dubois, MD  insulin glargine (LANTUS) 100 UNIT/ML injection Inject 0.38 mLs (38 Units total) into the skin 2 (two) times daily. 10/24/20   Barton Dubois, MD  JANUMET XR 50-1000 MG TB24 Take 1 tablet by mouth daily. 09/28/20   [provider]  lisinopril-hydrochlorothiazide (ZESTORETIC) 10-12.5 MG tablet Take 1 tablet by mouth daily. 09/02/20   [provider]  meclizine (ANTIVERT) 25 MG tablet Take 1 tablet (25 mg total) by mouth every 8 (eight) hours as needed for dizziness. 10/24/20   Barton Dubois, MD  pantoprazole (PROTONIX) 40 MG tablet Take 1 tablet (40 mg total) by mouth daily. 10/25/20   Barton Dubois, MD    Allergies    Patient has no known allergies.  Review of Systems   Review of Systems  Constitutional:  Negative for appetite change and fatigue.  HENT:  Negative for congestion, ear discharge and sinus pressure.   Eyes:  Negative for discharge.  Respiratory:  Negative for cough.   Cardiovascular:  Negative for chest pain.  Gastrointestinal:  Negative for abdominal pain and diarrhea.  Genitourinary:  Negative for frequency  and hematuria.  Musculoskeletal:  Negative for back pain.  Skin:  Negative for rash.  Neurological:  Positive for dizziness. Negative for seizures and headaches.  Psychiatric/Behavioral:  Negative for hallucinations.    Physical Exam Updated Vital Signs BP 132/85   Pulse 95   Temp 98.7 F (37.1 C) (Oral)   Resp 19   Ht _0  (1.626 m)   Wt 65.3 kg   LMP 12/16/2020 (Exact Date)   SpO2 99%   BMI 24.72 kg/m   Physical Exam Vitals and nursing note reviewed.  Constitutional:      Appearance: She is well-developed.  HENT:     Head: Normocephalic.     Nose: Nose normal.  Eyes:     General: No scleral icterus.    Conjunctiva/sclera:  Conjunctivae normal.  Neck:     Thyroid: No thyromegaly.  Cardiovascular:     Rate and Rhythm: Normal rate and regular rhythm.     Heart sounds: No murmur heard.   No friction rub. No gallop.  Pulmonary:     Breath sounds: No stridor. No wheezing or rales.  Chest:     Chest wall: No tenderness.  Abdominal:     General: There is no distension.     Tenderness: There is no abdominal tenderness. There is no rebound.  Musculoskeletal:        General: Normal range of motion.     Cervical back: Neck supple.  Lymphadenopathy:     Cervical: No cervical adenopathy.  Skin:    Findings: No erythema or rash.  Neurological:     Mental Status: She is alert and oriented to person, place, and time.     Motor: No abnormal muscle tone.     Coordination: Coordination normal.  Psychiatric:        Behavior: Behavior normal.    ED Results / Procedures / Treatments   Labs (all labs ordered are listed, but only abnormal results are displayed) Labs Reviewed  CBC WITH DIFFERENTIAL/PLATELET - Abnormal; Notable for the following components:      Result Value   Hemoglobin 10.0 (*)    HCT 32.2 (*)    MCH 25.5 (*)    All other components within normal limits  COMPREHENSIVE METABOLIC PANEL - Abnormal; Notable for the following components:   BUN 33 (*)    Creatinine, Ser 1.29 (*)    Albumin 3.2 (*)    AST 50 (*)    Alkaline Phosphatase 223 (*)    GFR, Estimated 52 (*)    All other components within normal limits  CBG MONITORING, ED - Abnormal; Notable for the following components:   Glucose-Capillary 67 (*)    All other components within normal limits    EKG None  Radiology No results found.  Procedures Procedures   Medications Ordered in ED Medications  sodium chloride 0.9 % bolus 500 mL (0 mLs Intravenous Stopped 12/24/20 1042)  sodium chloride 0.9 % bolus 1,000 mL (1,000 mLs Intravenous New Bag/Given 12/24/20 1057)    ED Course  I have reviewed the triage vital signs and the  nursing notes.  Pertinent labs & imaging results that were available during my care of the patient were reviewed by me and considered in my medical decision making (see chart for details).    MDM Rules/Calculators/A&P                          Patient with mild AKI.  She is given  a liter half of fluid and is feeling some better.  She is going to follow-up this week with her provider Final Clinical Impression(s) / ED Diagnoses Final diagnoses:  Dehydration  Vertigo    Rx / DC Orders ED Discharge Orders     None        Milton Ferguson, MD 12/24/20 1228

## 2020-12-24 NOTE — ED Notes (Signed)
Pt ambulate to the bathroom.  

## 2020-12-27 ENCOUNTER — Encounter: Payer: Self-pay | Admitting: Internal Medicine

## 2021-01-15 ENCOUNTER — Encounter (HOSPITAL_COMMUNITY): Payer: Self-pay | Admitting: Emergency Medicine

## 2021-01-15 ENCOUNTER — Encounter: Payer: Self-pay | Admitting: Emergency Medicine

## 2021-01-15 ENCOUNTER — Other Ambulatory Visit: Payer: Self-pay

## 2021-01-15 ENCOUNTER — Emergency Department (HOSPITAL_COMMUNITY)
Admission: EM | Admit: 2021-01-15 | Discharge: 2021-01-15 | Disposition: A | Discharge status: Home / Self Care | Payer: 59 | Attending: Emergency Medicine | Admitting: Emergency Medicine

## 2021-01-15 DIAGNOSIS — Z79899 Other long term (current) drug therapy: Secondary | ICD-10-CM | POA: Insufficient documentation

## 2021-01-15 DIAGNOSIS — E1165 Type 2 diabetes mellitus with hyperglycemia: Secondary | ICD-10-CM | POA: Insufficient documentation

## 2021-01-15 DIAGNOSIS — E1140 Type 2 diabetes mellitus with diabetic neuropathy, unspecified: Secondary | ICD-10-CM | POA: Diagnosis not present

## 2021-01-15 DIAGNOSIS — R42 Dizziness and giddiness: Secondary | ICD-10-CM | POA: Diagnosis present

## 2021-01-15 DIAGNOSIS — I10 Essential (primary) hypertension: Secondary | ICD-10-CM | POA: Insufficient documentation

## 2021-01-15 DIAGNOSIS — R739 Hyperglycemia, unspecified: Secondary | ICD-10-CM

## 2021-01-15 DIAGNOSIS — Z7984 Long term (current) use of oral hypoglycemic drugs: Secondary | ICD-10-CM | POA: Diagnosis not present

## 2021-01-15 DIAGNOSIS — G629 Polyneuropathy, unspecified: Secondary | ICD-10-CM | POA: Insufficient documentation

## 2021-01-15 DIAGNOSIS — Z794 Long term (current) use of insulin: Secondary | ICD-10-CM | POA: Insufficient documentation

## 2021-01-15 DIAGNOSIS — N3000 Acute cystitis without hematuria: Secondary | ICD-10-CM

## 2021-01-15 DIAGNOSIS — R000 Tachycardia, unspecified: Secondary | ICD-10-CM | POA: Insufficient documentation

## 2021-01-15 HISTORY — DX: Polyneuropathy, unspecified: G62.9

## 2021-01-15 LAB — COMPREHENSIVE METABOLIC PANEL
ALT: 21 U/L (ref 0–44)
AST: 24 U/L (ref 15–41)
Albumin: 3.5 g/dL (ref 3.5–5.0)
Alkaline Phosphatase: 201 U/L — ABNORMAL HIGH (ref 38–126)
Anion gap: 10 (ref 5–15)
BUN: 24 mg/dL — ABNORMAL HIGH (ref 6–20)
CO2: 26 mmol/L (ref 22–32)
Calcium: 8.9 mg/dL (ref 8.9–10.3)
Chloride: 91 mmol/L — ABNORMAL LOW (ref 98–111)
Creatinine, Ser: 1.64 mg/dL — ABNORMAL HIGH (ref 0.44–1.00)
GFR, Estimated: 39 mL/min — ABNORMAL LOW (ref >60)
Glucose, Bld: 590 mg/dL (ref 70–99)
Potassium: 4.7 mmol/L (ref 3.5–5.1)
Sodium: 127 mmol/L — ABNORMAL LOW (ref 135–145)
Total Bilirubin: 0.7 mg/dL (ref 0.3–1.2)
Total Protein: 7.5 g/dL (ref 6.5–8.1)

## 2021-01-15 LAB — CBC WITH DIFFERENTIAL/PLATELET
Abs Immature Granulocytes: 0.03 10*3/uL (ref 0.00–0.07)
Basophils Absolute: 0 10*3/uL (ref 0.0–0.1)
Basophils Relative: 1 %
Eosinophils Absolute: 0.3 10*3/uL (ref 0.0–0.5)
Eosinophils Relative: 3 %
HCT: 32.8 % — ABNORMAL LOW (ref 36.0–46.0)
Hemoglobin: 10.7 g/dL — ABNORMAL LOW (ref 12.0–15.0)
Immature Granulocytes: 0 %
Lymphocytes Relative: 15 %
Lymphs Abs: 1.3 10*3/uL (ref 0.7–4.0)
MCH: 26.2 pg (ref 26.0–34.0)
MCHC: 32.6 g/dL (ref 30.0–36.0)
MCV: 80.4 fL (ref 80.0–100.0)
Monocytes Absolute: 0.5 10*3/uL (ref 0.1–1.0)
Monocytes Relative: 6 %
Neutro Abs: 6.6 10*3/uL (ref 1.7–7.7)
Neutrophils Relative %: 75 %
Platelets: 158 10*3/uL (ref 150–400)
RBC: 4.08 MIL/uL (ref 3.87–5.11)
RDW: 15 % (ref 11.5–15.5)
WBC: 8.7 10*3/uL (ref 4.0–10.5)
nRBC: 0 % (ref 0.0–0.2)

## 2021-01-15 LAB — URINALYSIS, ROUTINE W REFLEX MICROSCOPIC
Bilirubin Urine: NEGATIVE
Glucose, UA: >=500 mg/dL — AB
Ketones, ur: NEGATIVE mg/dL
Nitrite: NEGATIVE
Protein, ur: 30 mg/dL — AB
Specific Gravity, Urine: 1.018 (ref 1.005–1.030)
WBC, UA: >50 WBC/hpf — ABNORMAL HIGH (ref 0–5)
pH: 6 (ref 5.0–8.0)

## 2021-01-15 LAB — HEMOGLOBIN A1C
Hgb A1c MFr Bld: 10.4 % — ABNORMAL HIGH (ref 4.8–5.6)
Mean Plasma Glucose: 251.78 mg/dL

## 2021-01-15 LAB — TROPONIN I (HIGH SENSITIVITY): Troponin I (High Sensitivity): 2 ng/L (ref <18)

## 2021-01-15 LAB — PREGNANCY, URINE: Preg Test, Ur: NEGATIVE

## 2021-01-15 LAB — CBG MONITORING, ED
Glucose-Capillary: 481 mg/dL — ABNORMAL HIGH (ref 70–99)
Glucose-Capillary: 468 mg/dL — ABNORMAL HIGH (ref 70–99)
Glucose-Capillary: 283 mg/dL — ABNORMAL HIGH (ref 70–99)

## 2021-01-15 MED ORDER — SODIUM CHLORIDE 0.9 % IV BOLUS
1000.0000 mL | Freq: Once | INTRAVENOUS | Status: AC
  Administered 2021-01-15: 1000 mL via INTRAVENOUS

## 2021-01-15 MED ORDER — SODIUM CHLORIDE 0.9 % IV SOLN
1.0000 g | Freq: Once | INTRAVENOUS | Status: AC
  Administered 2021-01-15: 1 g via INTRAVENOUS
  Filled 2021-01-15: qty 10

## 2021-01-15 MED ORDER — INSULIN ASPART 100 UNIT/ML IJ SOLN
15.0000 [IU] | Freq: Once | INTRAMUSCULAR | Status: AC
  Administered 2021-01-15: 15 [IU] via SUBCUTANEOUS
  Filled 2021-01-15: qty 1

## 2021-01-15 MED ORDER — CEPHALEXIN 500 MG PO CAPS: 500.0000 mg | ORAL_CAPSULE | Freq: Three times a day (TID) | ORAL | 0 refills | Status: AC

## 2021-01-15 MED ORDER — INSULIN ASPART 100 UNIT/ML IV SOLN
10.0000 [IU] | Freq: Once | INTRAVENOUS | Status: AC
  Administered 2021-01-15: 10 [IU] via INTRAVENOUS

## 2021-01-15 NOTE — ED Triage Notes (Signed)
Pt reports light headedness with increased over the past few day and headache. Hx of vertigo starting in may  of this year, takes meclizine as needed. Also reports bilateral blurry vision at times.

## 2021-01-15 NOTE — ED Provider Notes (Signed)
[facility] EMERGENCY DEPARTMENT Provider Note   CSN: [id number] Arrival date & time: 01/15/21  6701     History Chief Complaint  Patient presents with   Dizziness    Anne Carney is a 45 y.o. female.   Dizziness  This patient is a 45 year old female, she has a history of type 2 diabetes, she has a history of hyperosmolar nonketotic coma in May of this year, she has a history of hypertension as well.  She reports that she is on Lantus and other medications, reports that she takes his medications as prescribed, she does not check her blood sugar this week stating "I just have not had the energy".  She reports that she is starting to have increasing amounts of lightheadedness and dizziness, it is not vertigo, she has some blurry vision that goes along with it, she has no chest pain shortness of breath abdominal pain nausea vomiting or diarrhea, there is no dysuria.  She has no prior history of coronary disease that she is aware of.  The patient is having active symptoms at this time of lightheadedness and dizziness.  She was encouraged to eat a sandwich prior to coming in today hoping that that would help her symptoms but it did not.  She recalls being told she was anemic in the past but states nothing was done about it.  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     Family History  Problem Relation Age of Onset   Huntington's disease Father    Asthma Mother    Diabetes Daughter      Social History   Tobacco Use   Smoking status: Never   Smokeless tobacco: Never  Substance Use Topics   Alcohol use: Not Currently    Comment: occ.   Drug use: No    Home Medications Prior to Admission medications   Medication Sig Start Date End Date Taking? Authorizing Provider  cephALEXin (KEFLEX) 500 MG capsule Take 1 capsule (500 mg total) by mouth 3 (three) times daily for 7 days. 01/15/21 01/22/21 Yes Noemi Chapel, MD  blood glucose meter kit and supplies KIT Dispense based on patient and insurance preference. Use to check sugar up ro four times daily as directed. 10/24/20   Barton Dubois, MD  furosemide (LASIX) 20 MG tablet Take 20 mg by mouth daily as needed. 09/28/20   [provider]  gabapentin (NEURONTIN) 100 MG capsule Take 1 capsule (100 mg total) by mouth 2 (two) times daily. 10/24/20   Barton Dubois, MD  insulin glargine (LANTUS) 100 UNIT/ML injection Inject 0.38 mLs (38 Units total) into the skin 2 (two) times daily. 10/24/20   Barton Dubois, MD  JANUMET XR 50-1000 MG TB24 Take 1 tablet by mouth daily. 09/28/20   [provider]  lisinopril-hydrochlorothiazide (ZESTORETIC) 10-12.5 MG tablet Take 1 tablet by mouth daily. 09/02/20   [provider]  meclizine (ANTIVERT) 25 MG tablet  Take 1 tablet (25 mg total) by mouth every 8 (eight) hours as needed for dizziness. 10/24/20   Barton Dubois, MD  pantoprazole (PROTONIX) 40 MG tablet Take 1 tablet (40 mg total) by mouth daily. 10/25/20   Barton Dubois, MD    Allergies    Patient has no known allergies.  Review of Systems   Review of Systems  Neurological:  Positive for dizziness.  All other systems reviewed and are negative.  Physical Exam Updated Vital Signs BP 107/68   Pulse 85   Temp 98.1 F (36.7 C) (Oral)   Resp 16   Ht 1.626 m ('5\' 4"' )   Wt 68 kg   LMP 12/16/2020 (Exact Date)   SpO2 100%   BMI 25.75 kg/m   Physical Exam Vitals and nursing note reviewed.  Constitutional:       General: She is not in acute distress.    Appearance: She is well-developed.  HENT:     Head: Normocephalic and atraumatic.     Mouth/Throat:     Mouth: Mucous membranes are dry.     Pharynx: No oropharyngeal exudate.  Eyes:     General: No scleral icterus.       Right eye: No discharge.        Left eye: No discharge.     Conjunctiva/sclera: Conjunctivae normal.     Pupils: Pupils are equal, round, and reactive to light.  Neck:     Thyroid: No thyromegaly.     Vascular: No JVD.  Cardiovascular:     Rate and Rhythm: Regular rhythm. Tachycardia present.     Heart sounds: Normal heart sounds. No murmur heard.   No friction rub. No gallop.     Comments: Pulse of 105 Pulmonary:     Effort: Pulmonary effort is normal. No respiratory distress.     Breath sounds: Normal breath sounds. No wheezing or rales.  Abdominal:     General: Bowel sounds are normal. There is no distension.     Palpations: Abdomen is soft. There is no mass.     Tenderness: There is no abdominal tenderness.  Musculoskeletal:        General: No tenderness. Normal range of motion.     Cervical back: Normal range of motion and neck supple.     Right lower leg: No edema.     Left lower leg: No edema.  Lymphadenopathy:     Cervical: No cervical adenopathy.  Skin:    General: Skin is warm and dry.     Findings: No erythema or rash.  Neurological:     General: No focal deficit present.     Mental Status: She is alert.     Coordination: Coordination normal.     Comments: This patient has normal speech coordination and strength in all 4 extremities, she appears slightly tired but able to do everything I ask.  Psychiatric:        Behavior: Behavior normal.    ED Results / Procedures / Treatments   Labs (all labs ordered are listed, but only abnormal results are displayed) Labs Reviewed  COMPREHENSIVE METABOLIC PANEL - Abnormal; Notable for the following components:      Result Value   Sodium 127 (*)     Chloride 91 (*)    Glucose, Bld 590 (*)    BUN 24 (*)    Creatinine, Ser 1.64 (*)    Alkaline Phosphatase 201 (*)    GFR, Estimated 39 (*)    All other  components within normal limits  CBC WITH DIFFERENTIAL/PLATELET - Abnormal; Notable for the following components:   Hemoglobin 10.7 (*)    HCT 32.8 (*)    All other components within normal limits  URINALYSIS, ROUTINE W REFLEX MICROSCOPIC - Abnormal; Notable for the following components:   APPearance HAZY (*)    Glucose, UA >=500 (*)    Hgb urine dipstick MODERATE (*)    Protein, ur 30 (*)    Leukocytes,Ua SMALL (*)    WBC, UA >50 (*)    Bacteria, UA MANY (*)    All other components within normal limits  HEMOGLOBIN A1C - Abnormal; Notable for the following components:   Hgb A1c MFr Bld 10.4 (*)    All other components within normal limits  CBG MONITORING, ED - Abnormal; Notable for the following components:   Glucose-Capillary 481 (*)    All other components within normal limits  CBG MONITORING, ED - Abnormal; Notable for the following components:   Glucose-Capillary 468 (*)    All other components within normal limits  CBG MONITORING, ED - Abnormal; Notable for the following components:   Glucose-Capillary 283 (*)    All other components within normal limits  URINE CULTURE  PREGNANCY, URINE  TROPONIN I (HIGH SENSITIVITY)    EKG EKG Interpretation  Date/Time:  Sunday January 15 2021 08:54:26 EDT Ventricular Rate:  94 PR Interval:  143 QRS Duration: 88 QT Interval:  357 QTC Calculation: 447 R Axis:   24 Text Interpretation: Sinus rhythm Borderline low voltage, extremity leads Probable anteroseptal infarct, old ST elevation, consider inferior injury diffuse ST abnormality present Confirmed by Noemi Chapel [phone]) on 01/15/2021 9:01:48 AM  Radiology No results found.  Procedures Procedures   Medications Ordered in ED Medications  cefTRIAXone (ROCEPHIN) 1 g in sodium chloride 0.9 % 100 mL IVPB (has no administration  in time range)  sodium chloride 0.9 % bolus 1,000 mL (1,000 mLs Intravenous New Bag/Given 01/15/21 0920)  sodium chloride 0.9 % bolus 1,000 mL (1,000 mLs Intravenous New Bag/Given 01/15/21 0956)  insulin aspart (novoLOG) injection 15 Units (15 Units Subcutaneous Given 01/15/21 0953)  insulin aspart (novoLOG) injection 10 Units (10 Units Intravenous Given 01/15/21 1154)    ED Course  I have reviewed the triage vital signs and the nursing notes.  Pertinent labs & imaging results that were available during my care of the patient were reviewed by me and considered in my medical decision making (see chart for details).  Clinical Course as of 01/15/21 1338  Sun Jan 15, 2021  1311 UA c/w UTI - > 50 WBC's, fluids and insulin and abx given, pt is looking better [BM]    Clinical Course User Index [BM] Noemi Chapel, MD   MDM Rules/Calculators/A&P                           This very pleasant lady unfortunately likely has some complication of diabetes whether this is hyperglycemia, nonketotic state, or DKA it is not clear.  She will need IV fluids, lab work will ensue.  Blood pressure is normal, she is afebrile.  Oxygen is 100%.  Sugar down to 280's afte rinsuling and fluids Feeling better  Vitals:   01/15/21 1130 01/15/21 1200 01/15/21 1230 01/15/21 1300  BP: 121/71 123/71 111/65 107/68  Pulse: 93 87 84 85  Resp: 15   16  Temp:      TempSrc:      SpO2: 99% 100% 100%  100%  Weight:      Height:        UTI treated with Rocephin Home on cephalexin Culture pending Pt agreeable.  Final Clinical Impression(s) / ED Diagnoses Final diagnoses:  Acute cystitis without hematuria  Hyperglycemia    Rx / DC Orders ED Discharge Orders          Ordered    cephALEXin (KEFLEX) 500 MG capsule  3 times daily        01/15/21 1332             Noemi Chapel, MD 01/15/21 1338

## 2021-01-15 NOTE — Discharge Instructions (Addendum)
You have a urinary infection You MUST take your medicines as prescribed for your diabetes including your lantus and Janumet -  You must also take the keflex for the infection ER for worsening symtpoms.  Thank you for letting us take care of you today!  Please obtain all of your results from medical records or have your doctors office obtain the results - share them with your doctor - you should be seen at your doctors office in the next 2 days. Call today to arrange your follow up. Take the medications as prescribed. Please review all of the medicines and only take them if you do not have an allergy to them. Please be aware that if you are taking birth control pills, taking other prescriptions, ESPECIALLY ANTIBIOTICS may make the birth control ineffective - if this is the case, either do not engage in sexual activity or use alternative methods of birth control such as condoms until you have finished the medicine and your family doctor says it is OK to restart them. If you are on a blood thinner such as COUMADIN, be aware that any other medicine that you take may cause the coumadin to either work too much, or not enough - you should have your coumadin level rechecked in next 7 days if this is the case.  ?  It is also a possibility that you have an allergic reaction to any of the medicines that you have been prescribed - Everybody reacts differently to medications and while MOST people have no trouble with most medicines, you may have a reaction such as nausea, vomiting, rash, swelling, shortness of breath. If this is the case, please stop taking the medicine immediately and contact your physician.   If you were given a medication in the ED such as percocet, vicodin, or morphine, be aware that these medicines are sedating and may change your ability to take care of yourself adequately for several hours after being given this medicines - you should not drive or take care of small children if you were given  this medicine in the Emergency Department or if you have been prescribed these types of medicines. ?   You should return to the ER IMMEDIATELY if you develop severe or worsening symptoms.

## 2021-01-15 NOTE — ED Notes (Signed)
Pt aware of needing urine sample. Will call for assistance when able to go.

## 2021-01-18 LAB — URINE CULTURE: Culture: >=100000 — AB

## 2021-01-19 ENCOUNTER — Telehealth: Payer: Self-pay

## 2021-01-19 NOTE — Telephone Encounter (Signed)
Post ED Visit - Positive Culture Follow-up  Culture report reviewed by antimicrobial stewardship pharmacist: [facility] [facility] '[x]'$  Andreas Blower, Pharm.D. '[]'$  Heide Guile, Pharm.D., BCPS AQ-ID '[]'$  Parks Neptune, Pharm.D., BCPS '[]'$  Alycia Rossetti, Pharm.D., BCPS '[]'$  [place], Pharm.D., BCPS, AAHIVP '[]'$  Legrand Como, Pharm.D., BCPS, AAHIVP '[]'$  Salome Arnt, PharmD, BCPS '[]'$  Johnnette Gourd, PharmD, BCPS '[]'$  Hughes Better, PharmD, BCPS '[]'$  Leeroy Cha, PharmD '[]'$  Laqueta Linden, PharmD, BCPS '[]'$  Albertina Parr, PharmD  [place] Team '[]'$  Leodis Sias, PharmD '[]'$  Lindell Spar, PharmD '[]'$  Royetta Asal, PharmD '[]'$  Graylin Shiver, Rph '[]'$  Rema Fendt) Glennon Mac, PharmD '[]'$  Arlyn Dunning, PharmD '[]'$  Netta Cedars, PharmD '[]'$  Dia Sitter, PharmD '[]'$  Leone Haven, PharmD '[]'$  Gretta Arab, PharmD '[]'$  Theodis Shove, PharmD '[]'$  Peggyann Juba, PharmD '[]'$  Reuel Boom, PharmD   Positive urine culture Treated with Cephalexin, organism sensitive to the same and no further patient follow-up is required at this time.  Glennon Hamilton 01/19/2021, 9:51 AM

## 2021-02-07 ENCOUNTER — Other Ambulatory Visit: Payer: Self-pay

## 2021-02-07 ENCOUNTER — Ambulatory Visit (INDEPENDENT_AMBULATORY_CARE_PROVIDER_SITE_OTHER): Payer: 59

## 2021-02-07 ENCOUNTER — Ambulatory Visit (INDEPENDENT_AMBULATORY_CARE_PROVIDER_SITE_OTHER): Payer: 59 | Admitting: Podiatry

## 2021-02-07 DIAGNOSIS — M86672 Other chronic osteomyelitis, left ankle and foot: Secondary | ICD-10-CM

## 2021-02-07 DIAGNOSIS — M2042 Other hammer toe(s) (acquired), left foot: Secondary | ICD-10-CM | POA: Diagnosis not present

## 2021-02-07 DIAGNOSIS — L97522 Non-pressure chronic ulcer of other part of left foot with fat layer exposed: Secondary | ICD-10-CM

## 2021-02-08 NOTE — Progress Notes (Signed)
  Subjective:  Patient ID: Anne Carney, female    DOB: 07/02/1975,  MRN: [id number]  Chief Complaint  Patient presents with   Toe Pain    left foot second toe    45 y.o. female presents with the above complaint. History confirmed with patient.  Left second toe has been swollen and has been on antibiotics for her PCP but has not completely healed.  Her A1c is 10.4%.  Right foot second toe partial amputation is well-healed still  Objective:  Physical Exam: warm, good capillary refill, no trophic changes or ulcerative lesions, and normal DP and PT pulses. Left Foot: Swollen contracted hammertoe with erythema and ulceration on the dorsal medial surface that extends to subcutaneous tissue  Radiographs: Multiple views x-ray of the left foot: no soft tissue emphysema, no signs of osteomyelitis Assessment:   1. Hammertoe of left foot   2. Ulcer of toe of left foot, with fat layer exposed ([place])   3. 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  She has new ulceration and redness and swelling in the toe of the second foot.  This is the same toe that I did a flexor tenotomy on which improve her contracture at the DIPJ but not at the PIPJ or MPJ.  I am suspicious of osteomyelitis given her history.  May end up needing partial amputation for this as well even without osteomyelitis to correct the deformity.  Arc of MRI to evaluate for osteomyelitis and for surgical planning.  She will follow-up after this, continue applying Betadine daily without dressings  Return in about 3 weeks (around 02/28/2021) for wound care, after MRI to review.

## 2021-02-14 ENCOUNTER — Ambulatory Visit
Admission: RE | Admit: 2021-02-14 | Discharge: 2021-02-14 | Disposition: A | Discharge status: Home / Self Care | Payer: 59 | Source: Ambulatory Visit | Attending: Podiatry | Admitting: Podiatry

## 2021-02-14 ENCOUNTER — Other Ambulatory Visit: Payer: Self-pay

## 2021-02-14 DIAGNOSIS — L97522 Non-pressure chronic ulcer of other part of left foot with fat layer exposed: Secondary | ICD-10-CM

## 2021-02-14 DIAGNOSIS — M2042 Other hammer toe(s) (acquired), left foot: Secondary | ICD-10-CM

## 2021-02-24 ENCOUNTER — Other Ambulatory Visit (HOSPITAL_COMMUNITY): Payer: Self-pay

## 2021-03-06 ENCOUNTER — Telehealth: Payer: Self-pay | Admitting: *Deleted

## 2021-03-06 NOTE — Telephone Encounter (Signed)
Patient is calling to request MRI results from 02/15/21. Please advise.

## 2021-03-07 ENCOUNTER — Other Ambulatory Visit: Payer: Self-pay

## 2021-03-07 ENCOUNTER — Ambulatory Visit (INDEPENDENT_AMBULATORY_CARE_PROVIDER_SITE_OTHER): Payer: 59 | Admitting: Podiatry

## 2021-03-07 DIAGNOSIS — L97524 Non-pressure chronic ulcer of other part of left foot with necrosis of bone: Secondary | ICD-10-CM | POA: Diagnosis not present

## 2021-03-07 DIAGNOSIS — E1142 Type 2 diabetes mellitus with diabetic polyneuropathy: Secondary | ICD-10-CM

## 2021-03-07 DIAGNOSIS — M2042 Other hammer toe(s) (acquired), left foot: Secondary | ICD-10-CM | POA: Diagnosis not present

## 2021-03-07 NOTE — Telephone Encounter (Signed)
Patient has an appointment today @ 4:30.

## 2021-03-08 MED ORDER — DOXYCYCLINE HYCLATE 100 MG PO TABS: 100.0000 mg | ORAL_TABLET | Freq: Two times a day (BID) | ORAL | 0 refills | Status: AC

## 2021-03-08 NOTE — Progress Notes (Signed)
  Subjective:  Patient ID: Anne Carney, female    DOB: February 14, 1976,  MRN: [id number]  No chief complaint on file.   45 y.o. female returns for follow-up with the above complaint. History confirmed with patient.  She completed the MRI  Objective:  Physical Exam: warm, good capillary refill, no trophic changes or ulcerative lesions, and normal DP and PT pulses. Left Foot: Swollen contracted hammertoe with erythema and ulceration on the dorsal medial surface that extends to subcutaneous tissue  Radiographs: Multiple views x-ray of the left foot: no soft tissue emphysema, no signs of osteomyelitis  Study Result  Narrative & Impression  CLINICAL DATA:  Diabetic foot swelling. Osteomyelitis suspected. Attention 2nd toe with there is a nonhealing wound. History of hammertoe repair 7 months prior.   EXAM: MRI OF THE LEFT TOES WITHOUT CONTRAST   TECHNIQUE: Multiplanar, multisequence MR imaging of the left forefoot was performed. No intravenous contrast was administered.   COMPARISON:  Radiographs 02/07/2021 and 07/20/2005.   FINDINGS: Bones/Joint/Cartilage   There is marrow T2 hyperintensity and T1 hypointensity within the middle and distal phalanges of the 2nd toe. No cortical destruction or associated significant joint effusions. No other suspicious marrow signal abnormalities are identified. There are degenerative changes between the 1st metatarsal head and the tibial sesamoid. Additional degenerative changes are present in the midfoot, primarily in the base of the 4th metatarsal, the cuboid and navicular. No evidence of acute fracture or dislocation.   Ligaments   The Lisfranc ligament is intact. The collateral ligaments of the metatarsophalangeal joints are intact.   Muscles and Tendons   Unremarkable. The forefoot tendons are intact without significant tenosynovitis.   Soft tissues   Mild soft tissue edema throughout the distal 2nd toe. No focal  fluid collection or definite foreign body.   IMPRESSION: 1. Inflammatory changes within the soft tissues of the 2nd toe with marrow changes in the middle and distal phalanges supportive of osteomyelitis. No cortical destruction. 2. No evidence of soft tissue abscess or significant joint effusion. 3. Degenerative changes in the midfoot and 1st metatarsal head as described.     Electronically Signed   By: Richardean Sale M.D.   On: 02/15/2021 08:25     Assessment:   1. Ulcer of toe of left foot, with necrosis of bone ([place])   2. Type 2 diabetes mellitus with diabetic polyneuropathy, unspecified whether long term insulin use ([place])   3. Hammertoe of left foot       Plan:  Patient was evaluated and treated and all questions answered.  MRI reviewed and we discussed that she does have evidence of early osteomyelitis of the distal and middle phalanges of the left second toe.  We discussed treating with antibiotics to see if this can heal on its own versus definitive treatment with partial amputation.  I recommended partial amputation to eliminate the infection entirely and this will be similar to what she has on her other foot.  I think even if we can limit with antibiotics to amount of deformity she has will lead to ulcer recurrence and this will prevent that.  She understands and wishes to proceed.  All questions were addressed.  Informed consent was signed reviewed and surgery scheduled for few weeks.  I am keeping her on doxycycline until surgery to keep the infection under control.  This was prescribed for her.  No follow-ups on file.

## 2021-03-10 ENCOUNTER — Telehealth: Payer: Self-pay | Admitting: Urology

## 2021-03-10 NOTE — Telephone Encounter (Signed)
DOS - 03/31/21   AMPUTATION TOE IPJ 2ND LEFT --- 45997  [facility] EFFECTIVE DATE - 06/12/19  RECEIVED FAX FROM [facility] STATING THAT FOR CPT CODE 74142 NO PRIOR AUTH IS REQUIRED.

## 2021-03-31 ENCOUNTER — Other Ambulatory Visit: Payer: Self-pay | Admitting: Podiatry

## 2021-03-31 DIAGNOSIS — M86672 Other chronic osteomyelitis, left ankle and foot: Secondary | ICD-10-CM | POA: Diagnosis not present

## 2021-03-31 MED ORDER — OXYCODONE-ACETAMINOPHEN 5-325 MG PO TABS: 1.0000 | ORAL_TABLET | Freq: Four times a day (QID) | ORAL | 0 refills | Status: AC | PRN

## 2021-04-06 ENCOUNTER — Ambulatory Visit (INDEPENDENT_AMBULATORY_CARE_PROVIDER_SITE_OTHER): Payer: 59 | Admitting: Podiatry

## 2021-04-06 ENCOUNTER — Encounter: Payer: Self-pay | Admitting: Podiatry

## 2021-04-06 ENCOUNTER — Other Ambulatory Visit: Payer: Self-pay

## 2021-04-06 DIAGNOSIS — M86672 Other chronic osteomyelitis, left ankle and foot: Secondary | ICD-10-CM

## 2021-04-06 DIAGNOSIS — Z89422 Acquired absence of other left toe(s): Secondary | ICD-10-CM

## 2021-04-06 NOTE — Progress Notes (Signed)
  Subjective:  Patient ID: Leylany Nored, female    DOB: 07/31/1975,  MRN: [id number]  Chief Complaint  Patient presents with   Routine Post Op      POV #1 DOS 03/31/2021 PARTIAL LT 2ND TOE AMPUTATION      45 y.o. female returns for post-op check.  Doing well no issues  Review of Systems: Negative except as noted in the HPI. Denies N/V/F/Ch.   Objective:  There were no vitals filed for this visit. There is no height or weight on file to calculate BMI. Constitutional Well developed. Well nourished.  Vascular Foot warm and well perfused. Capillary refill normal to all digits.   Neurologic Normal speech. Oriented to person, place, and time. Epicritic sensation to light touch grossly present bilaterally.  Dermatologic Skin healing well without signs of infection. Skin edges well coapted without signs of infection.  Orthopedic: Tenderness to palpation noted about the surgical site.    Assessment:   1. 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  S/p foot surgery left -Progressing as expected post-operatively. -Healing well she can begin to bathe no soaking or scrubbing the incision.  We will remove sutures and 2 weeks.  Plan for her to be out of work for 4 weeks total.  Letter given  Return in about 2 weeks (around 04/20/2021) for post op (no x-rays), suture removal.

## 2021-04-18 ENCOUNTER — Ambulatory Visit: Payer: 59 | Admitting: Gastroenterology

## 2021-04-20 ENCOUNTER — Other Ambulatory Visit: Payer: Self-pay

## 2021-04-20 ENCOUNTER — Ambulatory Visit (INDEPENDENT_AMBULATORY_CARE_PROVIDER_SITE_OTHER): Payer: 59 | Admitting: Podiatry

## 2021-04-20 DIAGNOSIS — Z89422 Acquired absence of other left toe(s): Secondary | ICD-10-CM | POA: Diagnosis not present

## 2021-04-24 NOTE — Progress Notes (Signed)
  Subjective:  Patient ID: Anne Carney, female    DOB: March 20, 1976,  MRN: [id number]  Chief Complaint  Patient presents with   Routine Post Op      POV #2 DOS 03/31/2021 PARTIAL LT 2ND TOE AMPUTATION      45 y.o. female returns for post-op check.  Doing well no issues  Review of Systems: Negative except as noted in the HPI. Denies N/V/F/Ch.   Objective:  There were no vitals filed for this visit. There is no height or weight on file to calculate BMI. Constitutional Well developed. Well nourished.  Vascular Foot warm and well perfused. Capillary refill normal to all digits.   Neurologic Normal speech. Oriented to person, place, and time. Epicritic sensation to light touch grossly present bilaterally.  Dermatologic Skin healing well without signs of infection. Skin edges well coapted without signs of infection.  Orthopedic: Tenderness to palpation noted about the surgical site.    Assessment:   1. 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  S/p foot surgery left -Sutures removed today.  She can resume regular shoe gear and activity.  She will return to work as planned after Thanksgiving  No follow-ups on file.

## 2021-05-11 ENCOUNTER — Encounter: Payer: 59 | Admitting: Podiatry

## 2021-07-28 DIAGNOSIS — I10 Essential (primary) hypertension: Secondary | ICD-10-CM | POA: Diagnosis not present

## 2021-07-28 DIAGNOSIS — R8289 Other abnormal findings on cytological and histological examination of urine: Secondary | ICD-10-CM | POA: Insufficient documentation

## 2021-07-28 DIAGNOSIS — Z794 Long term (current) use of insulin: Secondary | ICD-10-CM | POA: Insufficient documentation

## 2021-07-28 DIAGNOSIS — Z79899 Other long term (current) drug therapy: Secondary | ICD-10-CM | POA: Insufficient documentation

## 2021-07-28 DIAGNOSIS — I959 Hypotension, unspecified: Secondary | ICD-10-CM | POA: Diagnosis not present

## 2021-07-28 DIAGNOSIS — R110 Nausea: Secondary | ICD-10-CM | POA: Diagnosis not present

## 2021-07-28 DIAGNOSIS — R42 Dizziness and giddiness: Secondary | ICD-10-CM | POA: Diagnosis present

## 2021-07-28 DIAGNOSIS — E1165 Type 2 diabetes mellitus with hyperglycemia: Secondary | ICD-10-CM | POA: Insufficient documentation

## 2021-07-29 ENCOUNTER — Emergency Department (HOSPITAL_COMMUNITY)
Admission: EM | Admit: 2021-07-29 | Discharge: 2021-07-29 | Disposition: A | Discharge status: Home / Self Care | Payer: 59 | Attending: Emergency Medicine | Admitting: Emergency Medicine

## 2021-07-29 ENCOUNTER — Other Ambulatory Visit: Payer: Self-pay

## 2021-07-29 ENCOUNTER — Encounter (HOSPITAL_COMMUNITY): Payer: Self-pay | Admitting: Emergency Medicine

## 2021-07-29 DIAGNOSIS — R81 Glycosuria: Secondary | ICD-10-CM

## 2021-07-29 DIAGNOSIS — R42 Dizziness and giddiness: Secondary | ICD-10-CM

## 2021-07-29 DIAGNOSIS — R110 Nausea: Secondary | ICD-10-CM

## 2021-07-29 LAB — CBG MONITORING, ED
Glucose-Capillary: 109 mg/dL — ABNORMAL HIGH (ref 70–99)
Glucose-Capillary: 162 mg/dL — ABNORMAL HIGH (ref 70–99)

## 2021-07-29 LAB — BASIC METABOLIC PANEL
Anion gap: 12 (ref 5–15)
BUN: 38 mg/dL — ABNORMAL HIGH (ref 6–20)
CO2: 23 mmol/L (ref 22–32)
Calcium: 8.8 mg/dL — ABNORMAL LOW (ref 8.9–10.3)
Chloride: 99 mmol/L (ref 98–111)
Creatinine, Ser: 1.48 mg/dL — ABNORMAL HIGH (ref 0.44–1.00)
GFR, Estimated: 44 mL/min — ABNORMAL LOW (ref >60)
Glucose, Bld: 72 mg/dL (ref 70–99)
Potassium: 4.3 mmol/L (ref 3.5–5.1)
Sodium: 134 mmol/L — ABNORMAL LOW (ref 135–145)

## 2021-07-29 LAB — URINALYSIS, ROUTINE W REFLEX MICROSCOPIC
Bacteria, UA: NONE SEEN
Bilirubin Urine: NEGATIVE
Glucose, UA: >=500 mg/dL — AB
Ketones, ur: NEGATIVE mg/dL
Nitrite: NEGATIVE
Protein, ur: NEGATIVE mg/dL
RBC / HPF: >50 RBC/hpf — ABNORMAL HIGH (ref 0–5)
Specific Gravity, Urine: 1.02 (ref 1.005–1.030)
pH: 5 (ref 5.0–8.0)

## 2021-07-29 LAB — CBC WITH DIFFERENTIAL/PLATELET
Abs Immature Granulocytes: 0.03 10*3/uL (ref 0.00–0.07)
Basophils Absolute: 0 10*3/uL (ref 0.0–0.1)
Basophils Relative: 0 %
Eosinophils Absolute: 0.4 10*3/uL (ref 0.0–0.5)
Eosinophils Relative: 5 %
HCT: 30.9 % — ABNORMAL LOW (ref 36.0–46.0)
Hemoglobin: 10.5 g/dL — ABNORMAL LOW (ref 12.0–15.0)
Immature Granulocytes: 0 %
Lymphocytes Relative: 16 %
Lymphs Abs: 1.5 10*3/uL (ref 0.7–4.0)
MCH: 29 pg (ref 26.0–34.0)
MCHC: 34 g/dL (ref 30.0–36.0)
MCV: 85.4 fL (ref 80.0–100.0)
Monocytes Absolute: 0.6 10*3/uL (ref 0.1–1.0)
Monocytes Relative: 6 %
Neutro Abs: 6.8 10*3/uL (ref 1.7–7.7)
Neutrophils Relative %: 73 %
Platelets: 168 10*3/uL (ref 150–400)
RBC: 3.62 MIL/uL — ABNORMAL LOW (ref 3.87–5.11)
RDW: 13.8 % (ref 11.5–15.5)
WBC: 9.4 10*3/uL (ref 4.0–10.5)
nRBC: 0 % (ref 0.0–0.2)

## 2021-07-29 LAB — TROPONIN I (HIGH SENSITIVITY)
Troponin I (High Sensitivity): 2 ng/L (ref <18)
Troponin I (High Sensitivity): 3 ng/L (ref <18)

## 2021-07-29 MED ORDER — SODIUM CHLORIDE 0.9 % IV SOLN: Freq: Once | INTRAVENOUS | Status: AC

## 2021-07-29 MED ORDER — SUCRALFATE 1 G PO TABS: 1.0000 g | ORAL_TABLET | Freq: Three times a day (TID) | ORAL | 0 refills | Status: DC

## 2021-07-29 MED ORDER — ONDANSETRON HCL 4 MG PO TABS: 4.0000 mg | ORAL_TABLET | ORAL | 0 refills | Status: DC | PRN

## 2021-07-29 MED ORDER — LACTATED RINGERS IV BOLUS
1000.0000 mL | Freq: Once | INTRAVENOUS | Status: AC
  Administered 2021-07-29: 1000 mL via INTRAVENOUS

## 2021-07-29 NOTE — ED Notes (Signed)
Patient ate her breakfast and states she feels better, but still feels "fuzzyheaded." Dr. Veleta Miners made aware.

## 2021-07-29 NOTE — Discharge Instructions (Addendum)
It was a pleasure caring for you today in the emergency department.  Please follow-up with your primary care doctor regarding diabetes management.  Please return to the emergency department for any worsening or worrisome symptoms.

## 2021-07-29 NOTE — ED Provider Notes (Addendum)
[facility] Provider Note   CSN: [id number] Arrival date & time: 07/28/21  2332     History  Chief Complaint  Patient presents with   Hyperglycemia    Anne Carney is a 46 y.o. female.  The history is provided by the patient.  Hyperglycemia She has history of hypertension, diabetes and came in because of concerns of her blood sugar being high.  She was driving home and noted that her vision was blurry.  She got home and checked her blood sugar and it was 433.  She took her usual dose of Lantus and laying down.  When she got up, she noted that she was feeling dizzy and nauseous and sweaty.  She took a dose of meclizine.  She did not vomit.  She denies chest pain, heaviness, tightness, pressure.  She is feeling a little better since arriving in the emergency department.   Home Medications Prior to Admission medications   Medication Sig Start Date End Date Taking? Authorizing Provider  blood glucose meter kit and supplies KIT Dispense based on patient and insurance preference. Use to check sugar up ro four times daily as directed. 10/24/20   Barton Dubois, MD  furosemide (LASIX) 20 MG tablet Take 20 mg by mouth daily as needed. 09/28/20   [provider]  gabapentin (NEURONTIN) 100 MG capsule Take 1 capsule (100 mg total) by mouth 2 (two) times daily. 10/24/20   Barton Dubois, MD  insulin glargine (LANTUS) 100 UNIT/ML injection Inject 0.38 mLs (38 Units total) into the skin 2 (two) times daily. 10/24/20   Barton Dubois, MD  JANUMET XR 50-1000 MG TB24 Take 1 tablet by mouth daily. 09/28/20   [provider]  lisinopril-hydrochlorothiazide (ZESTORETIC) 10-12.5 MG tablet Take 1 tablet by mouth daily. 09/02/20   [provider]  meclizine (ANTIVERT) 25 MG tablet Take 1 tablet (25 mg total) by mouth every 8 (eight) hours as needed for dizziness. 10/24/20   Barton Dubois, MD  pantoprazole (PROTONIX) 40 MG tablet Take 1 tablet (40 mg total) by  mouth daily. 10/25/20   Barton Dubois, MD      Allergies    Patient has no known allergies.    Review of Systems   Review of Systems  All other systems reviewed and are negative.  Physical Exam Updated Vital Signs BP (!) 92/58    Pulse 100    Temp 98.1 F (36.7 C) (Oral)    Resp 16    Ht '5\' 4"'  (1.626 m)    Wt 68 kg    SpO2 100%    BMI 25.73 kg/m  Physical Exam Vitals and nursing note reviewed.  46 year old female, resting comfortably and in no acute distress. Vital signs are normal. Oxygen saturation is 100%, which is normal. Head is normocephalic and atraumatic. PERRLA, EOMI. Oropharynx is clear. Neck is nontender and supple without adenopathy or JVD. Back is nontender and there is no CVA tenderness. Lungs are clear without rales, wheezes, or rhonchi. Chest is nontender. Heart has regular rate and rhythm without murmur. Abdomen is soft, flat, nontender without masses or hepatosplenomegaly and peristalsis is hypoactive. Extremities have no cyanosis or edema, full range of motion is present. Skin is warm and dry without rash. Neurologic: Mental status is normal, cranial nerves are intact, moves all extremities equally.  There is no nystagmus.  Dizziness is not reproduced by passive head movement.  ED Results / Procedures / Treatments   Labs (all labs ordered are listed,  but only abnormal results are displayed) Labs Reviewed  BASIC METABOLIC PANEL - Abnormal; Notable for the following components:      Result Value   Sodium 134 (*)    BUN 38 (*)    Creatinine, Ser 1.48 (*)    Calcium 8.8 (*)    GFR, Estimated 44 (*)    All other components within normal limits  CBC WITH DIFFERENTIAL/PLATELET - Abnormal; Notable for the following components:   RBC 3.62 (*)    Hemoglobin 10.5 (*)    HCT 30.9 (*)    All other components within normal limits  URINALYSIS, ROUTINE W REFLEX MICROSCOPIC - Abnormal; Notable for the following components:   APPearance HAZY (*)    Glucose, UA >=500  (*)    Hgb urine dipstick LARGE (*)    Leukocytes,Ua SMALL (*)    RBC / HPF >50 (*)    All other components within normal limits  CBG MONITORING, ED - Abnormal; Notable for the following components:   Glucose-Capillary 162 (*)    All other components within normal limits  CBG MONITORING, ED - Abnormal; Notable for the following components:   Glucose-Capillary 109 (*)    All other components within normal limits  TROPONIN I (HIGH SENSITIVITY)  TROPONIN I (HIGH SENSITIVITY)    EKG EKG Interpretation  Date/Time:  Saturday July 29 2021 03:40:33 EST Ventricular Rate:  101 PR Interval:  120 QRS Duration: 78 QT Interval:  353 QTC Calculation: 458 R Axis:   13 Text Interpretation: Sinus tachycardia Low voltage, precordial leads Baseline wander in lead(s) V1 V5 When compared with ECG of 01/15/2021, No significant change was found Confirmed by Delora Fuel ([id number]) on 07/29/2021 4:00:37 AM   Procedures Procedures  Cardiac monitor, per my interpretation, shows normal sinus rhythm.  Medications Ordered in ED Medications  0.9 %  sodium chloride infusion ( Intravenous New Bag/Given 07/29/21 0245)    ED Course/ Medical Decision Making/ A&P                           Medical Decision Making Amount and/or Complexity of Data Reviewed Labs: ordered.  Risk Prescription drug management.   Hyperglycemia at home.  CBG is obtained here and initially was 162, repeat 109.  Dizziness which could represent vertigo, but symptoms are not classic.  In the ED, patient had an episode where her blood pressure dropped to as low as 80 systolic.  She is given IV fluids and blood pressure is coming up.  We will need to check ECG and troponin to make sure this is not an angina equivalent.  Old records are reviewed, and she does have the prior ED visits for dehydration and dizziness.  Labs are unremarkable including troponin x2.  Urinalysis is positive for greater than 50 RBCs and presence of glucose but no  WBCs and negative nitrate, not indicative of UTI.  Patient's blood pressure dropped down to 80 following the first liter of IV fluid.  She was actually feeling well at this point.  She is will be given an additional liter and observed to see if she is stable for discharge.  Case is signed out to Dr. Pearline Cables.        Final Clinical Impression(s) / ED Diagnoses Final diagnoses:  Dizziness  Nausea  Hypotension, unspecified hypotension type    Rx / DC Orders ED Discharge Orders     None         Delora Fuel,  MD 14/27/67 0110    Delora Fuel, MD 03/49/61 [phone]

## 2021-07-29 NOTE — ED Triage Notes (Addendum)
Pt states that while she was driving home her vision was blurry, so when she got home her sugar was 433. States she took 35 units of lantus tonight after checking her cbg, but missed her morning dose. Pt ambulatory to room. Cbg 162 here.

## 2021-07-29 NOTE — ED Notes (Signed)
Patient's mom brought patient, bacon, egg and cheese biscuit and orange juice.

## 2021-07-29 NOTE — ED Provider Notes (Signed)
°  Provider Note MRN:  [id number]  Arrival date & time: 07/29/21    ED Course and Medical Decision Making  Assumed care from Dr Roxanne Mins  at shift change.  See not from prior team for complete details, in brief: 46 yo female, dizzy at home, felt like it was vertigo. Says her sugar was high at home. Labs with possible dehydration, hx baseline renal insufficiency. BP mildly improved after 1 L, 2nd L is pending.   Plan per prior physician if not improved after 2nd L recommend admit for hypotension.   Patient reassessed, reports she is feeling much better.  She is tolerating p.o. intake without difficulty.  Was able to eat a full breakfast without any nausea or vomiting.  Blood pressure improved.  She is ambulatory with a steady gait.  Hemodynamically stable.  Feels at her essential baseline.  The patient improved significantly and was discharged in stable condition. Detailed discussions were had with the patient regarding current findings, and need for close f/u with PCP or on call doctor. The patient has been instructed to return immediately if the symptoms worsen in any way for re-evaluation. Patient verbalized understanding and is in agreement with current care plan. All questions answered prior to discharge.    Procedures  Final Clinical Impressions(s) / ED Diagnoses     ICD-10-CM   1. Postural dizziness  R42     2. Nausea  R11.0     3. Glucosuria  R81       ED Discharge Orders          Ordered    sucralfate (CARAFATE) 1 g tablet  3 times daily with meals & bedtime        07/29/21 1003    ondansetron (ZOFRAN) 4 MG tablet  Every 4 hours PRN        07/29/21 1003              Discharge Instructions      It was a pleasure caring for you today in the emergency department.  Please follow-up with your primary care doctor regarding diabetes management.  Please return to the emergency department for any worsening or worrisome symptoms.          Jeanell Sparrow,  DO 07/29/21 1005

## 2021-08-24 ENCOUNTER — Ambulatory Visit: Payer: 59 | Admitting: Podiatry

## 2021-08-24 ENCOUNTER — Other Ambulatory Visit: Payer: Self-pay

## 2021-08-24 DIAGNOSIS — M2041 Other hammer toe(s) (acquired), right foot: Secondary | ICD-10-CM | POA: Diagnosis not present

## 2021-08-24 DIAGNOSIS — M2042 Other hammer toe(s) (acquired), left foot: Secondary | ICD-10-CM | POA: Diagnosis not present

## 2021-08-29 ENCOUNTER — Encounter: Payer: Self-pay | Admitting: Podiatry

## 2021-08-29 ENCOUNTER — Encounter: Payer: Self-pay | Admitting: Gastroenterology

## 2021-08-29 ENCOUNTER — Other Ambulatory Visit: Payer: Self-pay

## 2021-08-29 ENCOUNTER — Ambulatory Visit: Payer: 59 | Admitting: Gastroenterology

## 2021-08-29 ENCOUNTER — Telehealth: Payer: Self-pay | Admitting: *Deleted

## 2021-08-29 VITALS — BP 142/76 | HR 100 | Temp 97.8°F | Ht 65.0 in | Wt 156.0 lb

## 2021-08-29 DIAGNOSIS — R1012 Left upper quadrant pain: Secondary | ICD-10-CM | POA: Diagnosis not present

## 2021-08-29 DIAGNOSIS — K219 Gastro-esophageal reflux disease without esophagitis: Secondary | ICD-10-CM | POA: Diagnosis not present

## 2021-08-29 DIAGNOSIS — K5900 Constipation, unspecified: Secondary | ICD-10-CM | POA: Diagnosis not present

## 2021-08-29 DIAGNOSIS — R6881 Early satiety: Secondary | ICD-10-CM | POA: Diagnosis not present

## 2021-08-29 DIAGNOSIS — R1319 Other dysphagia: Secondary | ICD-10-CM | POA: Insufficient documentation

## 2021-08-29 DIAGNOSIS — R7989 Other specified abnormal findings of blood chemistry: Secondary | ICD-10-CM

## 2021-08-29 MED ORDER — PEG 3350-KCL-NA BICARB-NACL 420 G PO SOLR: ORAL | 0 refills | Status: DC

## 2021-08-29 NOTE — Patient Instructions (Addendum)
Please have labs done at [place] next to [facility]. ?For constipation, take miralax one packet daily. You can buy over the counter if needed chronically.  ?Colonoscopy and upper endoscopy to be scheduled. See separate instructions.  ?

## 2021-08-29 NOTE — H&P (View-Only) (Signed)
? ? ? ?GI Office Note   ? ?Referring Provider: Jerel Shepherd, [place] ?Primary Care Physician:  Jerel Shepherd, [place]  ?Primary Gastroenterologist: Elon Alas. Abbey Chatters, DO ? ? ?Chief Complaint  ? ?Chief Complaint  ?Patient presents with  ? Gastroesophageal Reflux  ?  Bad heartburn and sour burps.   ? Abdominal Pain  ?  Lower left side pain mostly at night or when walking alot  ? ? ? ?History of Present Illness  ? ?Anne Carney is a 46 y.o. female presenting today at the request of Alonza Smoker, FNP with [facility] for further evaluation of GERD, rotten egg belches not responsive to PPI. ? ?Patient presents today with several complaints. LUQ pain for 3-4 months. Unrelated to BMs. BMs regular. No melena,brbpr. Rotten egg smell with belching. Occasional vomiting. Bad heartburn. Solid food dysphagia. Pantoprazole didn't help. Not on anything right now.  Was supposed to be on famotidine, switched from pantoprazole but patient does not think she ever took it.  Occasional antacid. No BC/Goody's powders.  Her A1c 1 year ago was 13.  She was diagnosed with diabetes over 30 years ago.  Currently on Lantus and Janumet.  She states her sugars are better controlled but she is really not aware of what her A1c was when it was last checked.  She is due to go back [facility].  She states she has a history of abnormal LFTs but never had any work-up of it.  She also has mild anemia. Menses every month, bleeds 4-5 days, sometimes can be heavy but not always. ? ?No prior EGD/colonoscopy.  ? ?Medications  ? ?Current Outpatient Medications  ?Medication Sig Dispense Refill  ? blood glucose meter kit and supplies KIT Dispense based on patient and insurance preference. Use to check sugar up ro four times daily as directed. 1 each 0  ? furosemide (LASIX) 20 MG tablet Take 20 mg by mouth daily as needed.    ? gabapentin (NEURONTIN) 100 MG capsule Take 1 capsule (100 mg total) by  mouth 2 (two) times daily. (Patient taking differently: Take 200 mg by mouth 2 (two) times daily. Take 2 100 mg tablets twice daily)    ? insulin glargine (LANTUS) 100 UNIT/ML injection Inject 0.38 mLs (38 Units total) into the skin 2 (two) times daily. 10 mL 11  ? JANUMET XR 50-1000 MG TB24 Take 1 tablet by mouth daily.    ? lisinopril-hydrochlorothiazide (ZESTORETIC) 10-12.5 MG tablet Take 1 tablet by mouth daily.    ? meclizine (ANTIVERT) 25 MG tablet Take 1 tablet (25 mg total) by mouth every 8 (eight) hours as needed for dizziness. 30 tablet 0  ? ondansetron (ZOFRAN) 4 MG tablet Take 1 tablet (4 mg total) by mouth every 4 (four) hours as needed for nausea or vomiting. 5 tablet 0  ? Vitamin D, Ergocalciferol, (DRISDOL) 1.25 MG (50000 UNIT) CAPS capsule Take 50,000 Units by mouth once a week.    ? ?No current facility-administered medications for this visit.  ? ? ?Allergies  ? ?Allergies as of 08/29/2021  ? (No Known Allergies)  ? ? ?Past Medical History  ? ?Past Medical History:  ?Diagnosis Date  ? Diabetes mellitus   ? Hypertension   ? Kidney disease   ? Neuropathy   ? ? ?Past Surgical History  ? ?Past Surgical History:  ?Procedure Laterality Date  ? CESAREAN SECTION    ? ? ?Past Family History  ? ?  Family History  ?Problem Relation Age of Onset  ? Huntington's disease Father   ? Asthma Mother   ? Diabetes Daughter   ? ? ?Past Social History  ? ?Social History  ? ?Socioeconomic History  ? Marital status: Divorced  ?  Spouse name: Not on file  ? Number of children: Not on file  ? Years of education: Not on file  ? Highest education level: Not on file  ?Occupational History  ? Not on file  ?Tobacco Use  ? Smoking status: Never  ? Smokeless tobacco: Never  ?Substance and Sexual Activity  ? Alcohol use: Not Currently  ?  Comment: occ.  ? Drug use: Yes  ?  Types: Marijuana  ? Sexual activity: Yes  ?  Birth control/protection: None  ?Other Topics Concern  ? Not on file  ?Social History Narrative  ? Not on file   ? ?Social Determinants of Health  ? ?Financial Resource Strain: Not on file  ?Food Insecurity: Not on file  ?Transportation Needs: Not on file  ?Physical Activity: Not on file  ?Stress: Not on file  ?Social Connections: Not on file  ?Intimate Partner Violence: Not on file  ? ? ?Review of Systems  ? ?General: Negative for anorexia, weight loss, fever, chills, fatigue, weakness. ?Eyes: Negative for vision changes.  Seen in the ED last month with blurry vision in the setting of high sugars. ?ENT: Negative for hoarseness, difficulty swallowing , nasal congestion. ?CV: Negative for chest pain, angina, palpitations, dyspnea on exertion, peripheral edema.  ?Respiratory: Negative for dyspnea at rest, dyspnea on exertion, cough, sputum, wheezing.  ?GI: See history of present illness. ?GU:  Negative for dysuria, hematuria, urinary incontinence, urinary frequency, nocturnal urination.  See HPI. ?MS: Negative for joint pain, low back pain.  ?Derm: Negative for rash or itching.  ?Neuro: Negative for weakness, abnormal sensation, seizure, frequent headaches, memory loss,  ?confusion.  ?Psych: Negative for anxiety, depression, suicidal ideation, hallucinations.  ?Endo: Negative for unusual weight change.  ?Heme: Negative for bruising or bleeding. ?Allergy: Negative for rash or hives. ? ?Physical Exam  ? ?BP (!) 142/76 (BP Location: Right Arm, Patient Position: Sitting, Cuff Size: Normal)   Pulse 100   Temp 97.8 ?F (36.6 ?C) (Temporal)   Ht '5\' 5"'  (1.651 m)   Wt 156 lb (70.8 kg)   SpO2 98%   BMI 25.96 kg/m?  ?  ?General: Well-nourished, well-developed in no acute distress.  ?Head: Normocephalic, atraumatic.   ?Eyes: Conjunctiva pink, no icterus. ?Mouth: masked ?Neck: Supple without thyromegaly, masses, or lymphadenopathy.  ?Lungs: Clear to auscultation bilaterally.  ?Heart: Regular rate and rhythm, no murmurs rubs or gallops.  ?Abdomen: Bowel sounds are normal,  nondistended, no hepatosplenomegaly or masses,  ?no abdominal  bruits or hernia, no rebound or guarding.  Mild epigastric tenderness. ?Rectal: not performed ?Extremities: No lower extremity edema. No clubbing or deformities.  ?Neuro: Alert and oriented x 4 , grossly normal neurologically.  ?Skin: Warm and dry, no rash or jaundice.   ?Psych: Alert and cooperative, normal mood and affect. ? ?Labs  ? ?Lab Results  ?Component Value Date  ? CREATININE 1.48 (H) 07/29/2021  ? BUN 38 (H) 07/29/2021  ? NA 134 (L) 07/29/2021  ? K 4.3 07/29/2021  ? CL 99 07/29/2021  ? CO2 23 07/29/2021  ? ?Lab Results  ?Component Value Date  ? ALT 21 01/15/2021  ? AST 24 01/15/2021  ? ALKPHOS 201 (H) 01/15/2021  ? BILITOT 0.7 01/15/2021  ? ?Lab Results  ?  Component Value Date  ? WBC 9.4 07/29/2021  ? HGB 10.5 (L) 07/29/2021  ? HCT 30.9 (L) 07/29/2021  ? MCV 85.4 07/29/2021  ? PLT 168 07/29/2021  ? ?Lab Results  ?Component Value Date  ? HGBA1C 10.4 (H) 01/15/2021  ? ? ? ?Imaging Studies  ? ?No results found. ? ?Assessment  ? ?Anne Carney is a 46 y.o. female presenting today with left upper quadrant pain, refractory GERD, rotten egg belches. ? ?GERD: refractory heartburn symptoms. Solid food dysphagia. LUQ pain. Likely exacerbated by suspected gastroparesis. New onset symptoms, warrant further evaluation. Ddx includes reflux esophagitis, hiatal hernia web/ring/stricture.  ? ?Suspected gastroparesis: complains of early satiety, rotten-egg belches in the setting of poorly controlled diabetes. Diagnosed with diabetes over 30 years ago. Suspect gastroparesis. Needs EGD to rule out structural issues/gastric outlet obstruction/gastritis/PUD/hiatal hernia.  ? ?Abnormal LFTs: no prior work up. ? ?Colon cancer screening: no prior colonoscopy. ? ?Constipation: mild symptoms. Start miralax.  ? ? ?PLAN  ? ?Upper endoscopy with possible esophageal dilation and colonoscopy with Dr. Abbey Chatters.  ASA 3.  I have discussed the risks, alternatives, benefits with regards to but not limited to the risk of reaction to medication,  bleeding, infection, perforation and the patient is agreeable to proceed. Written consent to be obtained. ?Labs to be done to evaluate abdominal pain and abnormal LFTs. ?Samples of MiraLAX provided to take 1 packet

## 2021-08-29 NOTE — Telephone Encounter (Signed)
Spoke with pt. She has been scheduled for TCS/EGD +/-DIL with propofol ASA3, Dr. Abbey Chatters on 4/17 at 9:30am. Aware will send prep to pharmacy and will mail instructions with pre-op appt. Confirmed address and pharmacy. Also aware needs to start dulcolax 3 days prior.

## 2021-08-29 NOTE — Progress Notes (Signed)
? ? ? ?GI Office Note   ? ?Referring Provider: Jerel Shepherd, [place] ?Primary Care Physician:  Jerel Shepherd, [place]  ?Primary Gastroenterologist: Elon Alas. Abbey Chatters, DO ? ? ?Chief Complaint  ? ?Chief Complaint  ?Patient presents with  ? Gastroesophageal Reflux  ?  Bad heartburn and sour burps.   ? Abdominal Pain  ?  Lower left side pain mostly at night or when walking alot  ? ? ? ?History of Present Illness  ? ?Anne Carney is a 46 y.o. female presenting today at the request of Alonza Smoker, FNP with [facility] for further evaluation of GERD, rotten egg belches not responsive to PPI. ? ?Patient presents today with several complaints. LUQ pain for 3-4 months. Unrelated to BMs. BMs regular. No melena,brbpr. Rotten egg smell with belching. Occasional vomiting. Bad heartburn. Solid food dysphagia. Pantoprazole didn't help. Not on anything right now.  Was supposed to be on famotidine, switched from pantoprazole but patient does not think she ever took it.  Occasional antacid. No BC/Goody's powders.  Her A1c 1 year ago was 13.  She was diagnosed with diabetes over 30 years ago.  Currently on Lantus and Janumet.  She states her sugars are better controlled but she is really not aware of what her A1c was when it was last checked.  She is due to go back to see her provider at the health department in April.  She states she has a history of abnormal LFTs but never had any work-up of it.  She also has mild anemia. Menses every month, bleeds 4-5 days, sometimes can be heavy but not always. ? ?No prior EGD/colonoscopy.  ? ?Medications  ? ?Current Outpatient Medications  ?Medication Sig Dispense Refill  ? blood glucose meter kit and supplies KIT Dispense based on patient and insurance preference. Use to check sugar up ro four times daily as directed. 1 each 0  ? furosemide (LASIX) 20 MG tablet Take 20 mg by mouth daily as needed.    ? gabapentin (NEURONTIN) 100 MG capsule Take 1 capsule (100 mg total) by  mouth 2 (two) times daily. (Patient taking differently: Take 200 mg by mouth 2 (two) times daily. Take 2 100 mg tablets twice daily)    ? insulin glargine (LANTUS) 100 UNIT/ML injection Inject 0.38 mLs (38 Units total) into the skin 2 (two) times daily. 10 mL 11  ? JANUMET XR 50-1000 MG TB24 Take 1 tablet by mouth daily.    ? lisinopril-hydrochlorothiazide (ZESTORETIC) 10-12.5 MG tablet Take 1 tablet by mouth daily.    ? meclizine (ANTIVERT) 25 MG tablet Take 1 tablet (25 mg total) by mouth every 8 (eight) hours as needed for dizziness. 30 tablet 0  ? ondansetron (ZOFRAN) 4 MG tablet Take 1 tablet (4 mg total) by mouth every 4 (four) hours as needed for nausea or vomiting. 5 tablet 0  ? Vitamin D, Ergocalciferol, (DRISDOL) 1.25 MG (50000 UNIT) CAPS capsule Take 50,000 Units by mouth once a week.    ? ?No current facility-administered medications for this visit.  ? ? ?Allergies  ? ?Allergies as of 08/29/2021  ? (No Known Allergies)  ? ? ?Past Medical History  ? ?Past Medical History:  ?Diagnosis Date  ? Diabetes mellitus   ? Hypertension   ? Kidney disease   ? Neuropathy   ? ? ?Past Surgical History  ? ?Past Surgical History:  ?Procedure Laterality Date  ? CESAREAN SECTION    ? ? ?Past Family History  ? ?  Family History  ?Problem Relation Age of Onset  ? Huntington's disease Father   ? Asthma Mother   ? Diabetes Daughter   ? ? ?Past Social History  ? ?Social History  ? ?Socioeconomic History  ? Marital status: Divorced  ?  Spouse name: Not on file  ? Number of children: Not on file  ? Years of education: Not on file  ? Highest education level: Not on file  ?Occupational History  ? Not on file  ?Tobacco Use  ? Smoking status: Never  ? Smokeless tobacco: Never  ?Substance and Sexual Activity  ? Alcohol use: Not Currently  ?  Comment: occ.  ? Drug use: Yes  ?  Types: Marijuana  ? Sexual activity: Yes  ?  Birth control/protection: None  ?Other Topics Concern  ? Not on file  ?Social History Narrative  ? Not on file   ? ?Social Determinants of Health  ? ?Financial Resource Strain: Not on file  ?Food Insecurity: Not on file  ?Transportation Needs: Not on file  ?Physical Activity: Not on file  ?Stress: Not on file  ?Social Connections: Not on file  ?Intimate Partner Violence: Not on file  ? ? ?Review of Systems  ? ?General: Negative for anorexia, weight loss, fever, chills, fatigue, weakness. ?Eyes: Negative for vision changes.  Seen in the ED last month with blurry vision in the setting of high sugars. ?ENT: Negative for hoarseness, difficulty swallowing , nasal congestion. ?CV: Negative for chest pain, angina, palpitations, dyspnea on exertion, peripheral edema.  ?Respiratory: Negative for dyspnea at rest, dyspnea on exertion, cough, sputum, wheezing.  ?GI: See history of present illness. ?GU:  Negative for dysuria, hematuria, urinary incontinence, urinary frequency, nocturnal urination.  See HPI. ?MS: Negative for joint pain, low back pain.  ?Derm: Negative for rash or itching.  ?Neuro: Negative for weakness, abnormal sensation, seizure, frequent headaches, memory loss,  ?confusion.  ?Psych: Negative for anxiety, depression, suicidal ideation, hallucinations.  ?Endo: Negative for unusual weight change.  ?Heme: Negative for bruising or bleeding. ?Allergy: Negative for rash or hives. ? ?Physical Exam  ? ?BP (!) 142/76 (BP Location: Right Arm, Patient Position: Sitting, Cuff Size: Normal)   Pulse 100   Temp 97.8 ?F (36.6 ?C) (Temporal)   Ht '5\' 5"'  (1.651 m)   Wt 156 lb (70.8 kg)   SpO2 98%   BMI 25.96 kg/m?  ?  ?General: Well-nourished, well-developed in no acute distress.  ?Head: Normocephalic, atraumatic.   ?Eyes: Conjunctiva pink, no icterus. ?Mouth: masked ?Neck: Supple without thyromegaly, masses, or lymphadenopathy.  ?Lungs: Clear to auscultation bilaterally.  ?Heart: Regular rate and rhythm, no murmurs rubs or gallops.  ?Abdomen: Bowel sounds are normal,  nondistended, no hepatosplenomegaly or masses,  ?no abdominal  bruits or hernia, no rebound or guarding.  Mild epigastric tenderness. ?Rectal: not performed ?Extremities: No lower extremity edema. No clubbing or deformities.  ?Neuro: Alert and oriented x 4 , grossly normal neurologically.  ?Skin: Warm and dry, no rash or jaundice.   ?Psych: Alert and cooperative, normal mood and affect. ? ?Labs  ? ?Lab Results  ?Component Value Date  ? CREATININE 1.48 (H) 07/29/2021  ? BUN 38 (H) 07/29/2021  ? NA 134 (L) 07/29/2021  ? K 4.3 07/29/2021  ? CL 99 07/29/2021  ? CO2 23 07/29/2021  ? ?Lab Results  ?Component Value Date  ? ALT 21 01/15/2021  ? AST 24 01/15/2021  ? ALKPHOS 201 (H) 01/15/2021  ? BILITOT 0.7 01/15/2021  ? ?Lab Results  ?  Component Value Date  ? WBC 9.4 07/29/2021  ? HGB 10.5 (L) 07/29/2021  ? HCT 30.9 (L) 07/29/2021  ? MCV 85.4 07/29/2021  ? PLT 168 07/29/2021  ? ?Lab Results  ?Component Value Date  ? HGBA1C 10.4 (H) 01/15/2021  ? ? ? ?Imaging Studies  ? ?No results found. ? ?Assessment  ? ?Anne Carney is a 46 y.o. female presenting today with left upper quadrant pain, refractory GERD, rotten egg belches. ? ?GERD: refractory heartburn symptoms. Solid food dysphagia. LUQ pain. Likely exacerbated by suspected gastroparesis. New onset symptoms, warrant further evaluation. Ddx includes reflux esophagitis, hiatal hernia web/ring/stricture.  ? ?Suspected gastroparesis: complains of early satiety, rotten-egg belches in the setting of poorly controlled diabetes. Diagnosed with diabetes over 30 years ago. Suspect gastroparesis. Needs EGD to rule out structural issues/gastric outlet obstruction/gastritis/PUD/hiatal hernia.  ? ?Abnormal LFTs: no prior work up. ? ?Colon cancer screening: no prior colonoscopy. ? ?Constipation: mild symptoms. Start miralax.  ? ? ?PLAN  ? ?Upper endoscopy with possible esophageal dilation and colonoscopy with Dr. Abbey Chatters.  ASA 3.  I have discussed the risks, alternatives, benefits with regards to but not limited to the risk of reaction to medication,  bleeding, infection, perforation and the patient is agreeable to proceed. Written consent to be obtained. ?Labs to be done to evaluate abdominal pain and abnormal LFTs. ?Samples of MiraLAX provided to take 1 packet

## 2021-08-29 NOTE — Progress Notes (Signed)
?  Subjective:  ?Patient ID: Anne Carney, female    DOB: Nov 29, 1975,  MRN: [id number] ? ?Chief Complaint  ?Patient presents with  ? Toe Pain  ?  Right foot 3rd toe   ? ? ?46 y.o. female presents with the above complaint. History confirmed with patient.  She returns today for follow-up of her third toe bilateral most of the right side the contracture here is leading to thickening of the toenails she is worried she will get an ulcer and infection here like she previously did on the second toes ? ?Objective:  ?Physical Exam: ?warm, good capillary refill, no trophic changes or ulcerative lesions, and normal DP and PT pulses.  Bilaterally she has semireducible hammertoe deformities, prior partial amputation of the second toe ? ?Radiographs: ?Multiple views x-ray of both feet: Prior bilateral foot radiographs reviewed she has hammertoe contracture deformity bilateral 2 through 5 ?Assessment:  ?No diagnosis found. ? ? ?Plan:  ?Patient was evaluated and treated and all questions answered. ? ?We again discussed surgical and nonsurgical treatment of her hammertoe deformities.  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  I think this would likely progress to the other toes if deformity correction is not undertaken.  We discussed using silicone offloading pads as well as something less invasive such as a flexor tenotomy but she did not respond well to this as it did not adequately correct the deformity and left her with an elongated second toe that led to wound formation.  She had excellent wound healing after her last surgery and I recommended we proceed with hammertoe correction of the lesser toes and she is interested in this as well.  We discussed the risk benefits and potential complications including but not limited to pain, swelling, infection, scar, numbness which may be temporary or permanent, chronic pain, stiffness, nerve pain or damage, wound  healing problems, bone healing problems including delayed or non-union.  She understands and wishes to proceed.  Informed consent was signed and reviewed. ? ? ?Surgical plan: ? ?Procedure: ?-Hammertoe deformity 2, 3, 4, 5 right foot ? ?Location: ?-[facility] ? ?Anesthesia plan: ?-IV sedation with local ? ?Postoperative pain plan: ?- Tylenol 1000 mg every 6 hours, ibuprofen 600 mg every 6 hours, gabapentin 300 mg every 8 hours x5 days, oxycodone 5 mg 1-2 tabs every 6 hours only as needed ? ?DVT prophylaxis: ?-None required ? ?WB Restrictions / DME needs: ?-WBAT in surgical shoe she still has this and is in good shape ? ?No follow-ups on file.  ? ?

## 2021-08-30 ENCOUNTER — Encounter: Payer: Self-pay | Admitting: *Deleted

## 2021-09-05 LAB — COMPREHENSIVE METABOLIC PANEL
ALT: 29 IU/L (ref 0–32)
AST: 28 IU/L (ref 0–40)
Albumin/Globulin Ratio: 1.6 (ref 1.2–2.2)
Albumin: 4.3 g/dL (ref 3.8–4.8)
Alkaline Phosphatase: 251 IU/L — ABNORMAL HIGH (ref 44–121)
BUN/Creatinine Ratio: 20 (ref 9–23)
BUN: 21 mg/dL (ref 6–24)
Bilirubin Total: 0.3 mg/dL (ref 0.0–1.2)
CO2: 27 mmol/L (ref 20–29)
Calcium: 9.5 mg/dL (ref 8.7–10.2)
Chloride: 92 mmol/L — ABNORMAL LOW (ref 96–106)
Creatinine, Ser: 1.07 mg/dL — ABNORMAL HIGH (ref 0.57–1.00)
Globulin, Total: 2.7 g/dL (ref 1.5–4.5)
Glucose: 387 mg/dL — ABNORMAL HIGH (ref 70–99)
Potassium: 4.4 mmol/L (ref 3.5–5.2)
Sodium: 132 mmol/L — ABNORMAL LOW (ref 134–144)
Total Protein: 7 g/dL (ref 6.0–8.5)
eGFR: 64 mL/min/{1.73_m2} (ref >59)

## 2021-09-05 LAB — IRON,TIBC AND FERRITIN PANEL
Ferritin: 19 ng/mL (ref 15–150)
Iron Saturation: 10 % — ABNORMAL LOW (ref 15–55)
Iron: 42 ug/dL (ref 27–159)
Total Iron Binding Capacity: 417 ug/dL (ref 250–450)
UIBC: 375 ug/dL (ref 131–425)

## 2021-09-05 LAB — CBC WITH DIFFERENTIAL/PLATELET
Basophils Absolute: 0 10*3/uL (ref 0.0–0.2)
Basos: 1 %
EOS (ABSOLUTE): 0.2 10*3/uL (ref 0.0–0.4)
Eos: 2 %
Hematocrit: 36.3 % (ref 34.0–46.6)
Hemoglobin: 11.5 g/dL (ref 11.1–15.9)
Immature Grans (Abs): 0 10*3/uL (ref 0.0–0.1)
Immature Granulocytes: 0 %
Lymphocytes Absolute: 1.8 10*3/uL (ref 0.7–3.1)
Lymphs: 24 %
MCH: 27.8 pg (ref 26.6–33.0)
MCHC: 31.7 g/dL (ref 31.5–35.7)
MCV: 88 fL (ref 79–97)
Monocytes Absolute: 0.4 10*3/uL (ref 0.1–0.9)
Monocytes: 6 %
Neutrophils Absolute: 5.1 10*3/uL (ref 1.4–7.0)
Neutrophils: 67 %
Platelets: 142 10*3/uL — ABNORMAL LOW (ref 150–450)
RBC: 4.14 x10E6/uL (ref 3.77–5.28)
RDW: 13.3 % (ref 11.7–15.4)
WBC: 7.5 10*3/uL (ref 3.4–10.8)

## 2021-09-05 LAB — MITOCHONDRIAL ANTIBODIES: Mitochondrial Ab: <20 Units (ref 0.0–20.0)

## 2021-09-05 LAB — HEMOGLOBIN A1C
Est. average glucose Bld gHb Est-mCnc: 283 mg/dL
Hgb A1c MFr Bld: 11.5 % — ABNORMAL HIGH (ref 4.8–5.6)

## 2021-09-05 LAB — HEPATITIS C ANTIBODY: Hep C Virus Ab: NONREACTIVE

## 2021-09-05 LAB — LIPASE: Lipase: 15 U/L (ref 14–72)

## 2021-09-05 LAB — HEPATITIS B SURFACE ANTIGEN: Hepatitis B Surface Ag: NEGATIVE

## 2021-09-11 ENCOUNTER — Other Ambulatory Visit: Payer: Self-pay

## 2021-09-11 ENCOUNTER — Other Ambulatory Visit: Payer: Self-pay | Admitting: *Deleted

## 2021-09-11 DIAGNOSIS — R7989 Other specified abnormal findings of blood chemistry: Secondary | ICD-10-CM

## 2021-09-11 DIAGNOSIS — R1012 Left upper quadrant pain: Secondary | ICD-10-CM

## 2021-09-11 DIAGNOSIS — R6881 Early satiety: Secondary | ICD-10-CM

## 2021-09-18 NOTE — Patient Instructions (Signed)
? ? ? ? ? ? ? ? ? Worthy Flank ? 09/18/2021  ?  ? '@PREFPERIOPPHARMACY'$ @ ? ? Your procedure is scheduled on  09/25/2021. ? ? Report to [facility] at  0730  A.M. ? ? Call this number if you have problems the morning of surgery: ? [phone] ? ? Remember: ? Follow the diet and prep instructions given to you by the office. ?  ? Take these medicines the morning of surgery with A SIP OF WATER  ? ?antivert(if needed), zofran(if needed). ?  ? Do not wear jewelry, make-up or nail polish. ? Do not wear lotions, powders, or perfumes, or deodorant. ? Do not shave 48 hours prior to surgery.  Men may shave face and neck. ? Do not bring valuables to the hospital. ? [facility] is not responsible for any belongings or valuables. ? ?Contacts, dentures or bridgework may not be worn into surgery.  Leave your suitcase in the car.  After surgery it may be brought to your room. ? ?For patients admitted to the hospital, discharge time will be determined by your treatment team. ? ?Patients discharged the day of surgery will not be allowed to drive home and must have someone with them for 24 hours.  ? ? ?Special instructions:   DO NOT smoke tobacco or vape for 24 hours before your procedure. ? ?Please read over the following fact sheets that you were given. ?Anesthesia Post-op Instructions and Care and Recovery After Surgery ?  ? ? ? Upper Endoscopy, Adult, Care After ?This sheet gives you information about how to care for yourself after your procedure. Your health care provider may also give you more specific instructions. If you have problems or questions, contact your health care provider. ?What can I expect after the procedure? ?After the procedure, it is common to have: ?A sore throat. ?Mild stomach pain or discomfort. ?Bloating. ?Nausea. ?Follow these instructions at home: ? ?Follow instructions from your health care provider about what to eat or drink after your procedure. ?Return to your normal activities as told by your health  care provider. Ask your health care provider what activities are safe for you. ?Take over-the-counter and prescription medicines only as told by your health care provider. ?If you were given a sedative during the procedure, it can affect you for several hours. Do not drive or operate machinery until your health care provider says that it is safe. ?Keep all follow-up visits as told by your health care provider. This is important. ?Contact a health care provider if you have: ?A sore throat that lasts longer than one day. ?Trouble swallowing. ?Get help right away if: ?You vomit blood or your vomit looks like coffee grounds. ?You have: ?A fever. ?Bloody, black, or tarry stools. ?A severe sore throat or you cannot swallow. ?Difficulty breathing. ?Severe pain in your chest or abdomen. ?Summary ?After the procedure, it is common to have a sore throat, mild stomach discomfort, bloating, and nausea. ?If you were given a sedative during the procedure, it can affect you for several hours. Do not drive or operate machinery until your health care provider says that it is safe. ?Follow instructions from your health care provider about what to eat or drink after your procedure. ?Return to your normal activities as told by your health care provider. ?This information is not intended to replace advice given to you by your health care provider. Make sure you discuss any questions you have with your health care provider. ?Document Revised: 04/03/2019 Document  Reviewed: 10/28/2017 ?Elsevier Patient Education ? [place]. ?Esophageal Dilatation ?Esophageal dilatation, also called esophageal dilation, is a procedure to widen or open a blocked or narrowed part of the esophagus. The esophagus is the part of the body that moves food and liquid from the mouth to the stomach. You may need this procedure if: ?You have a buildup of scar tissue in your esophagus that makes it difficult, painful, or impossible to swallow. This can be  caused by gastroesophageal reflux disease (GERD). ?You have cancer of the esophagus. ?There is a problem with how food moves through your esophagus. ?In some cases, you may need this procedure repeated at a later time to dilate the esophagus gradually. ?Tell a health care provider about: ?Any allergies you have. ?All medicines you are taking, including vitamins, herbs, eye drops, creams, and over-the-counter medicines. ?Any problems you or family members have had with anesthetic medicines. ?Any blood disorders you have. ?Any surgeries you have had. ?Any medical conditions you have. ?Any antibiotic medicines you are required to take before dental procedures. ?Whether you are pregnant or may be pregnant. ?What are the risks? ?Generally, this is a safe procedure. However, problems may occur, including: ?Bleeding due to a tear in the lining of the esophagus. ?A hole, or perforation, in the esophagus. ?What happens before the procedure? ?Ask your health care provider about: ?Changing or stopping your regular medicines. This is especially important if you are taking diabetes medicines or blood thinners. ?Taking medicines such as aspirin and ibuprofen. These medicines can thin your blood. Do not take these medicines unless your health care provider tells you to take them. ?Taking over-the-counter medicines, vitamins, herbs, and supplements. ?Follow instructions from your health care provider about eating or drinking restrictions. ?Plan to have a responsible adult take you home from the hospital or clinic. ?Plan to have a responsible adult care for you for the time you are told after you leave the hospital or clinic. This is important. ?What happens during the procedure? ?You may be given a medicine to help you relax (sedative). ?A numbing medicine may be sprayed into the back of your throat, or you may gargle the medicine. ?Your health care provider may perform the dilatation using various surgical instruments, such  as: ?Simple dilators. This instrument is carefully placed in the esophagus to stretch it. ?Guided wire bougies. This involves using an endoscope to insert a wire into the esophagus. A dilator is passed over this wire to enlarge the esophagus. Then the wire is removed. ?Balloon dilators. An endoscope with a small balloon is inserted into the esophagus. The balloon is inflated to stretch the esophagus and open it up. ?The procedure may vary among health care providers and hospitals. ?What can I expect after the procedure? ?Your blood pressure, heart rate, breathing rate, and blood oxygen level will be monitored until you leave the hospital or clinic. ?Your throat may feel slightly sore and numb. This will get better over time. ?You will not be allowed to eat or drink until your throat is no longer numb. ?When you are able to drink, urinate, and sit on the edge of the bed without nausea or dizziness, you may be able to return home. ?Follow these instructions at home: ?Take over-the-counter and prescription medicines only as told by your health care provider. ?If you were given a sedative during the procedure, it can affect you for several hours. Do not drive or operate machinery until your health care provider says that it is  safe. ?Plan to have a responsible adult care for you for the time you are told. This is important. ?Follow instructions from your health care provider about any eating or drinking restrictions. ?Do not use any products that contain nicotine or tobacco, such as cigarettes, e-cigarettes, and chewing tobacco. If you need help quitting, ask your health care provider. ?Keep all follow-up visits. This is important. ?Contact a health care provider if: ?You have a fever. ?You have pain that is not relieved by medicine. ?Get help right away if: ?You have chest pain. ?You have trouble breathing. ?You have trouble swallowing. ?You vomit blood. ?You have black, tarry, or bloody stools. ?These symptoms may  represent a serious problem that is an emergency. Do not wait to see if the symptoms will go away. Get medical help right away. Call your local emergency services (911 in the [place] Do not drive yourself to the hos

## 2021-09-21 ENCOUNTER — Ambulatory Visit (HOSPITAL_COMMUNITY): Admission: RE | Admit: 2021-09-21 | Payer: PRIVATE HEALTH INSURANCE | Source: Ambulatory Visit

## 2021-09-21 ENCOUNTER — Encounter (HOSPITAL_COMMUNITY): Payer: Self-pay

## 2021-09-21 ENCOUNTER — Encounter (HOSPITAL_COMMUNITY)
Admission: RE | Admit: 2021-09-21 | Discharge: 2021-09-21 | Disposition: A | Discharge status: Home / Self Care | Payer: 59 | Source: Ambulatory Visit | Attending: Internal Medicine | Admitting: Internal Medicine

## 2021-09-21 VITALS — BP 135/78 | HR 91 | Temp 98.9°F | Ht 65.0 in | Wt 150.0 lb

## 2021-09-21 DIAGNOSIS — Z01812 Encounter for preprocedural laboratory examination: Secondary | ICD-10-CM | POA: Diagnosis present

## 2021-09-21 DIAGNOSIS — Z01818 Encounter for other preprocedural examination: Secondary | ICD-10-CM

## 2021-09-21 LAB — PREGNANCY, URINE: Preg Test, Ur: NEGATIVE

## 2021-09-22 ENCOUNTER — Ambulatory Visit (HOSPITAL_COMMUNITY)
Admission: RE | Admit: 2021-09-22 | Discharge: 2021-09-22 | Disposition: A | Discharge status: Home / Self Care | Payer: 59 | Source: Ambulatory Visit | Attending: Gastroenterology | Admitting: Gastroenterology

## 2021-09-22 DIAGNOSIS — R7989 Other specified abnormal findings of blood chemistry: Secondary | ICD-10-CM | POA: Diagnosis present

## 2021-09-25 ENCOUNTER — Other Ambulatory Visit: Payer: Self-pay

## 2021-09-25 ENCOUNTER — Ambulatory Visit (HOSPITAL_COMMUNITY)
Admission: RE | Admit: 2021-09-25 | Discharge: 2021-09-25 | Disposition: A | Discharge status: Home / Self Care | Payer: 59 | Attending: Internal Medicine | Admitting: Internal Medicine

## 2021-09-25 ENCOUNTER — Ambulatory Visit (HOSPITAL_COMMUNITY): Payer: 59 | Admitting: Anesthesiology

## 2021-09-25 ENCOUNTER — Telehealth: Payer: Self-pay

## 2021-09-25 ENCOUNTER — Encounter (HOSPITAL_COMMUNITY)
Admission: RE | Disposition: A | Discharge status: Home / Self Care | Payer: Self-pay | Source: Home / Self Care | Attending: Internal Medicine

## 2021-09-25 ENCOUNTER — Other Ambulatory Visit: Payer: Self-pay | Admitting: *Deleted

## 2021-09-25 ENCOUNTER — Encounter (HOSPITAL_COMMUNITY): Payer: Self-pay

## 2021-09-25 ENCOUNTER — Ambulatory Visit (HOSPITAL_BASED_OUTPATIENT_CLINIC_OR_DEPARTMENT_OTHER): Payer: 59 | Admitting: Anesthesiology

## 2021-09-25 DIAGNOSIS — R1012 Left upper quadrant pain: Secondary | ICD-10-CM | POA: Diagnosis not present

## 2021-09-25 DIAGNOSIS — Z1211 Encounter for screening for malignant neoplasm of colon: Secondary | ICD-10-CM | POA: Diagnosis not present

## 2021-09-25 DIAGNOSIS — K219 Gastro-esophageal reflux disease without esophagitis: Secondary | ICD-10-CM | POA: Insufficient documentation

## 2021-09-25 DIAGNOSIS — Z7984 Long term (current) use of oral hypoglycemic drugs: Secondary | ICD-10-CM | POA: Diagnosis not present

## 2021-09-25 DIAGNOSIS — E1140 Type 2 diabetes mellitus with diabetic neuropathy, unspecified: Secondary | ICD-10-CM | POA: Insufficient documentation

## 2021-09-25 DIAGNOSIS — R195 Other fecal abnormalities: Secondary | ICD-10-CM | POA: Diagnosis not present

## 2021-09-25 DIAGNOSIS — N2889 Other specified disorders of kidney and ureter: Secondary | ICD-10-CM

## 2021-09-25 DIAGNOSIS — I10 Essential (primary) hypertension: Secondary | ICD-10-CM | POA: Diagnosis not present

## 2021-09-25 DIAGNOSIS — Z794 Long term (current) use of insulin: Secondary | ICD-10-CM | POA: Insufficient documentation

## 2021-09-25 DIAGNOSIS — K3189 Other diseases of stomach and duodenum: Secondary | ICD-10-CM

## 2021-09-25 DIAGNOSIS — K5900 Constipation, unspecified: Secondary | ICD-10-CM | POA: Diagnosis not present

## 2021-09-25 HISTORY — PX: ESOPHAGOGASTRODUODENOSCOPY (EGD) WITH PROPOFOL: SHX5813

## 2021-09-25 HISTORY — PX: COLONOSCOPY WITH PROPOFOL: SHX5780

## 2021-09-25 HISTORY — PX: BIOPSY: SHX5522

## 2021-09-25 LAB — GLUCOSE, CAPILLARY
Glucose-Capillary: 81 mg/dL (ref 70–99)
Glucose-Capillary: 106 mg/dL — ABNORMAL HIGH (ref 70–99)
Glucose-Capillary: 95 mg/dL (ref 70–99)

## 2021-09-25 SURGERY — COLONOSCOPY WITH PROPOFOL
Anesthesia: General

## 2021-09-25 MED ORDER — LACTATED RINGERS IV SOLN: INTRAVENOUS | Status: DC

## 2021-09-25 MED ORDER — DEXTROSE 50 % IV SOLN
INTRAVENOUS | Status: AC
  Administered 2021-09-25: 25 g via INTRAVENOUS
  Filled 2021-09-25: qty 50

## 2021-09-25 MED ORDER — PANTOPRAZOLE SODIUM 40 MG PO TBEC: 40.0000 mg | DELAYED_RELEASE_TABLET | Freq: Every day | ORAL | 5 refills | Status: DC

## 2021-09-25 MED ORDER — LIDOCAINE HCL (CARDIAC) PF 100 MG/5ML IV SOSY
PREFILLED_SYRINGE | INTRAVENOUS | Status: DC | PRN
  Administered 2021-09-25: 50 mg via INTRAVENOUS

## 2021-09-25 MED ORDER — LACTATED RINGERS IV SOLN
INTRAVENOUS | Status: DC | PRN
Start: 2021-09-25 — End: 2021-09-25

## 2021-09-25 MED ORDER — PROPOFOL 10 MG/ML IV BOLUS
INTRAVENOUS | Status: DC | PRN
  Administered 2021-09-25: 100 mg via INTRAVENOUS

## 2021-09-25 MED ORDER — PROPOFOL 500 MG/50ML IV EMUL
INTRAVENOUS | Status: DC | PRN
  Administered 2021-09-25: 150 ug/kg/min via INTRAVENOUS

## 2021-09-25 MED ORDER — DEXTROSE 50 % IV SOLN: 25.0000 g | Freq: Once | INTRAVENOUS | Status: AC

## 2021-09-25 NOTE — Op Note (Signed)
[facility] ?Patient Name: Anne Carney ?Procedure Date: 09/25/2021 9:41 AM ?MRN: [id number] ?Date of Birth: [birth year] ?Attending MD: Elon Alas. Abbey Chatters , DO ?CSN: [id number] ?Age: 46 ?Admit Type: Outpatient ?Procedure:                Upper GI endoscopy ?Indications:              Abdominal pain in the left upper quadrant, Heartburn ?Providers:                Elon Alas. Abbey Chatters, DO, Janeece Riggers, RN, Janett Billow  ?                          Boudreaux, Nelma Rothman, Technician, [employer],  ?                          Technician ?Referring MD:              ?Medicines:                See the Anesthesia note for documentation of the  ?                          administered medications ?Complications:            No immediate complications. ?Estimated Blood Loss:     Estimated blood loss was minimal. ?Procedure:                Pre-Anesthesia Assessment: ?                          - The anesthesia plan was to use monitored  ?                          anesthesia care (MAC). ?                          After obtaining informed consent, the endoscope was  ?                          passed under direct vision. Throughout the  ?                          procedure, the patient's blood pressure, pulse, and  ?                          oxygen saturations were monitored continuously. The  ?                          GIF-H190 ([id number]) scope was introduced through the  ?                          mouth, and advanced to the second part of duodenum.  ?                          The upper GI endoscopy was accomplished without  ?                          difficulty.  The patient tolerated the procedure  ?                          well. ?Scope In: 9:57:02 AM ?Scope Out: 9:59:16 AM ?Total Procedure Duration: 0 hours 2 minutes 14 seconds  ?Findings: ?     There is no endoscopic evidence of bleeding, areas of erosion,  ?     esophagitis, ulcerations or varices in the entire esophagus. ?     The Z-line was regular and was found 35 cm from the  incisors. ?     Localized mildly erythematous mucosa without bleeding was found in the  ?     gastric antrum. Biopsies were taken with a cold forceps for histology. ?     The duodenal bulb, first portion of the duodenum and second portion of  ?     the duodenum were normal. ?Impression:               - Z-line regular, 35 cm from the incisors. ?                          - Erythematous mucosa in the antrum. Biopsied. ?                          - Normal duodenal bulb, first portion of the  ?                          duodenum and second portion of the duodenum. ?Moderate Sedation: ?     Per Anesthesia Care ?Recommendation:           - Patient has a contact number available for  ?                          emergencies. The signs and symptoms of potential  ?                          delayed complications were discussed with the  ?                          patient. Return to normal activities tomorrow.  ?                          Written discharge instructions were provided to the  ?                          patient. ?                          - Resume previous diet. ?                          - Continue present medications. ?                          - Await pathology results. ?                          - Use Protonix (pantoprazole) 40 mg PO daily. ?                          -  Return to GI clinic in 4 months. ?Procedure Code(s):        --- Professional --- ?                          [phone], Esophagogastroduodenoscopy, flexible,  ?                          transoral; with biopsy, single or multiple ?Diagnosis Code(s):        --- Professional --- ?                          K31.89, Other diseases of stomach and duodenum ?                          R10.12, Left upper quadrant pain ?                          R12, Heartburn ?CPT copyright 2019 American Medical Association. All rights reserved. ?The codes documented in this report are preliminary and upon coder review may  ?be revised to meet current compliance  requirements. ?Elon Alas. Abbey Chatters, DO ?Elon Alas. Abbey Chatters, DO ?09/25/2021 10:02:35 AM ?This report has been signed electronically. ?Number of Addenda: 0 ?

## 2021-09-25 NOTE — Anesthesia Preprocedure Evaluation (Signed)
Anesthesia Evaluation  ?Patient identified by MRN, date of birth, ID band ?Patient awake ? ? ? ?Reviewed: ?Allergy & Precautions, H&P , NPO status , Patient's Chart, lab work & pertinent test results, reviewed documented beta blocker date and time  ? ?Airway ?Mallampati: II ? ?TM Distance: >3 FB ?Neck ROM: full ? ? ? Dental ?no notable dental hx. ? ?  ?Pulmonary ?neg pulmonary ROS,  ?  ?Pulmonary exam normal ?breath sounds clear to auscultation ? ? ? ? ? ? Cardiovascular ?Exercise Tolerance: Good ?hypertension, negative cardio ROS ? ? ?Rhythm:regular Rate:Normal ? ? ?  ?Neuro/Psych ?negative neurological ROS ? negative psych ROS  ? GI/Hepatic ?Neg liver ROS, GERD  Medicated,  ?Endo/Other  ?negative endocrine ROSdiabetes ? Renal/GU ?negative Renal ROS  ?negative genitourinary ?  ?Musculoskeletal ? ? Abdominal ?  ?Peds ? Hematology ?negative hematology ROS ?(+)   ?Anesthesia Other Findings ? ? Reproductive/Obstetrics ?negative OB ROS ? ?  ? ? ? ? ? ? ? ? ? ? ? ? ? ?  ?  ? ? ? ? ? ? ? ? ?Anesthesia Physical ?Anesthesia Plan ? ?ASA: 2 ? ?Anesthesia Plan: General  ? ?Post-op Pain Management:   ? ?Induction:  ? ?PONV Risk Score and Plan: Propofol infusion ? ?Airway Management Planned:  ? ?Additional Equipment:  ? ?Intra-op Plan:  ? ?Post-operative Plan:  ? ?Informed Consent: I have reviewed the patients History and Physical, chart, labs and discussed the procedure including the risks, benefits and alternatives for the proposed anesthesia with the patient or authorized representative who has indicated his/her understanding and acceptance.  ? ? ? ?Dental Advisory Given ? ?Plan Discussed with: CRNA ? ?Anesthesia Plan Comments:   ? ? ? ? ? ? ?Anesthesia Quick Evaluation ? ?

## 2021-09-25 NOTE — Discharge Instructions (Addendum)
EGD ?Discharge instructions ?Please read the instructions outlined below and refer to this sheet in the next few weeks. These discharge instructions provide you with general information on caring for yourself after you leave the hospital. Your doctor may also give you specific instructions. While your treatment has been planned according to the most current medical practices available, unavoidable complications occasionally occur. If you have any problems or questions after discharge, please call your doctor. ?ACTIVITY ?You may resume your regular activity but move at a slower pace for the next 24 hours.  ?Take frequent rest periods for the next 24 hours.  ?Walking will help expel (get rid of) the air and reduce the bloated feeling in your abdomen.  ?No driving for 24 hours (because of the anesthesia (medicine) used during the test).  ?You may shower.  ?Do not sign any important legal documents or operate any machinery for 24 hours (because of the anesthesia used during the test).  ?NUTRITION ?Drink plenty of fluids.  ?You may resume your normal diet.  ?Begin with a light meal and progress to your normal diet.  ?Avoid alcoholic beverages for 24 hours or as instructed by your caregiver.  ?MEDICATIONS ?You may resume your normal medications unless your caregiver tells you otherwise.  ?WHAT YOU CAN EXPECT TODAY ?You may experience abdominal discomfort such as a feeling of fullness or ?gas? pains.  ?FOLLOW-UP ?Your doctor will discuss the results of your test with you.  ?SEEK IMMEDIATE MEDICAL ATTENTION IF ANY OF THE FOLLOWING OCCUR: ?Excessive nausea (feeling sick to your stomach) and/or vomiting.  ?Severe abdominal pain and distention (swelling).  ?Trouble swallowing.  ?Temperature over 101? F (37.8? C).  ?Rectal bleeding or vomiting of blood.  ? ? ?Colonoscopy ?Discharge Instructions ? ?Read the instructions outlined below and refer to this sheet in the next few weeks. These discharge instructions provide you with  general information on caring for yourself after you leave the hospital. Your doctor may also give you specific instructions. While your treatment has been planned according to the most current medical practices available, unavoidable complications occasionally occur.  ? ?ACTIVITY ?You may resume your regular activity, but move at a slower pace for the next 24 hours.  ?Take frequent rest periods for the next 24 hours.  ?Walking will help get rid of the air and reduce the bloated feeling in your belly (abdomen).  ?No driving for 24 hours (because of the medicine (anesthesia) used during the test).   ?Do not sign any important legal documents or operate any machinery for 24 hours (because of the anesthesia used during the test).  ?NUTRITION ?Drink plenty of fluids.  ?You may resume your normal diet as instructed by your doctor.  ?Begin with a light meal and progress to your normal diet. Heavy or fried foods are harder to digest and may make you feel sick to your stomach (nauseated).  ?Avoid alcoholic beverages for 24 hours or as instructed.  ?MEDICATIONS ?You may resume your normal medications unless your doctor tells you otherwise.  ?WHAT YOU CAN EXPECT TODAY ?Some feelings of bloating in the abdomen.  ?Passage of more gas than usual.  ?Spotting of blood in your stool or on the toilet paper.  ?IF YOU HAD POLYPS REMOVED DURING THE COLONOSCOPY: ?No aspirin products for 7 days or as instructed.  ?No alcohol for 7 days or as instructed.  ?Eat a soft diet for the next 24 hours.  ?FINDING OUT THE RESULTS OF YOUR TEST ?Not all test results are available  during your visit. If your test results are not back during the visit, make an appointment with your caregiver to find out the results. Do not assume everything is normal if you have not heard from your caregiver or the medical facility. It is important for you to follow up on all of your test results.  ?SEEK IMMEDIATE MEDICAL ATTENTION IF: ?You have more than a spotting of  blood in your stool.  ?Your belly is swollen (abdominal distention).  ?You are nauseated or vomiting.  ?You have a temperature over 101.  ?You have abdominal pain or discomfort that is severe or gets worse throughout the day.  ? ?Your EGD revealed mild amount inflammation in your stomach.  I took biopsies of this to rule out infection with a bacteria called H. pylori.  Await pathology results, my office will contact you.  I am going to start you on pantoprazole 40 mg daily to see if this helps with your symptoms. ? ?Unfortunately your colon was not adequately prepped today.  I did not see any large polyps or evidence of colon cancer though certainly could have missed smaller polyps.  Would recommend repeat colonoscopy in 3 to 6 months with different colon prep. ? ?Our radiology department should be in contact with you to set up CT scan of your kidneys. ? ?Follow up with Magda Paganini in 4 months ? ? ?I hope you have a great rest of your week! ? ?Elon Alas. Abbey Chatters, D.O. ?Gastroenterology and Hepatology ?[facility] ? ?

## 2021-09-25 NOTE — Anesthesia Procedure Notes (Signed)
Date/Time: 09/25/2021 9:57 AM ?Performed by: Orlie Dakin, CRNA ?Pre-anesthesia Checklist: Patient identified, Emergency Drugs available, Suction available and Patient being monitored ?Patient Re-evaluated:Patient Re-evaluated prior to induction ?Oxygen Delivery Method: Nasal cannula ?Induction Type: IV induction ?Placement Confirmation: positive ETCO2 ? ? ? ? ?

## 2021-09-25 NOTE — Progress Notes (Signed)
Anne Carney had a procedure on 09/25/2021.  She cannot return to work until after 10:30am on 09/26/2021.  ?

## 2021-09-25 NOTE — Anesthesia Postprocedure Evaluation (Signed)
Anesthesia Post Note ? ?Patient: Anne Carney ? ?Procedure(s) Performed: COLONOSCOPY WITH PROPOFOL ?ESOPHAGOGASTRODUODENOSCOPY (EGD) WITH PROPOFOL ?BIOPSY ? ?Patient location during evaluation: Phase II ?Anesthesia Type: General ?Level of consciousness: awake ?Pain management: pain level controlled ?Vital Signs Assessment: post-procedure vital signs reviewed and stable ?Respiratory status: spontaneous breathing and respiratory function stable ?Cardiovascular status: blood pressure returned to baseline and stable ?Postop Assessment: no headache and no apparent nausea or vomiting ?Anesthetic complications: no ?Comments: Late entry ? ? ?No notable events documented. ? ? ?Last Vitals:  ?Vitals:  ? 09/25/21 1013  ?BP: 115/70  ?Resp: 18  ?Temp: 36.6 ?C  ?SpO2: 100%  ?  ?Last Pain:  ?Vitals:  ? 09/25/21 1013  ?TempSrc: Oral  ?PainSc: 3   ? ? ?  ?  ?  ?  ?  ?  ? ?[name] ? ? ? ? ?

## 2021-09-25 NOTE — Telephone Encounter (Signed)
Thank you, it has been addressed. ?

## 2021-09-25 NOTE — Transfer of Care (Signed)
Immediate Anesthesia Transfer of Care Note ? ?Patient: Anne Carney ? ?Procedure(s) Performed: COLONOSCOPY WITH PROPOFOL ?ESOPHAGOGASTRODUODENOSCOPY (EGD) WITH PROPOFOL ?BIOPSY ? ?Patient Location: Short Stay ? ?Anesthesia Type:General ? ?Level of Consciousness: awake ? ?Airway & Oxygen Therapy: Patient Spontanous Breathing ? ?Post-op Assessment: Report given to RN and Post -op Vital signs reviewed and stable ? ?Post vital signs: Reviewed and stable ? ?Last Vitals:  ?Vitals Value Taken Time  ?BP    ?Temp    ?Pulse    ?Resp    ?SpO2    ? ? ?Last Pain:  ?Vitals:  ? 09/25/21 0956  ?PainSc: 5   ?   ? ?  ? ?Complications: No notable events documented. ?

## 2021-09-25 NOTE — Telephone Encounter (Signed)
noted 

## 2021-09-25 NOTE — Op Note (Signed)
[facility] ?Patient Name: Anne Carney ?Procedure Date: 09/25/2021 10:01 AM ?MRN: [id number] ?Date of Birth: 09-Mar-1976 ?Attending MD: Elon Alas. Abbey Chatters , DO ?CSN: [id number] ?Age: 46 ?Admit Type: Outpatient ?Procedure:                Colonoscopy ?Indications:              Screening for colorectal malignant neoplasm ?Providers:                Elon Alas. Abbey Chatters, DO, Janeece Riggers, RN, Janett Billow  ?                          [place], Nelma Rothman, Technician, [employer],  ?                          Technician ?Referring MD:              ?Medicines:                See the Anesthesia note for documentation of the  ?                          administered medications ?Complications:            No immediate complications. ?Estimated Blood Loss:     Estimated blood loss: none. ?Procedure:                Pre-Anesthesia Assessment: ?                          - The anesthesia plan was to use monitored  ?                          anesthesia care (MAC). ?                          After obtaining informed consent, the colonoscope  ?                          was passed under direct vision. Throughout the  ?                          procedure, the patient's blood pressure, pulse, and  ?                          oxygen saturations were monitored continuously. The  ?                          PCF-HQ190L ([id number]) scope was introduced through  ?                          the anus and advanced to the the cecum, identified  ?                          by appendiceal orifice and ileocecal valve. The  ?                          colonoscopy was performed without difficulty. The  ?  patient tolerated the procedure well. The quality  ?                          of the bowel preparation was evaluated using the  ?                          BBPS [facility] Bowel Preparation Scale) with scores  ?                          of: Right Colon = 1 (portion of mucosa seen, but  ?                          other areas not well seen  due to staining, residual  ?                          stool and/or opaque liquid), Transverse Colon = 1  ?                          (portion of mucosa seen, but other areas not well  ?                          seen due to staining, residual stool and/or opaque  ?                          liquid) and Left Colon = 1 (portion of mucosa seen,  ?                          but other areas not well seen due to staining,  ?                          residual stool and/or opaque liquid). The total  ?                          BBPS score equals 3. The quality of the bowel  ?                          preparation was inadequate. ?Scope In: 10:04:17 AM ?Scope Out: 10:10:10 AM ?Scope Withdrawal Time: 0 hours 3 minutes 31 seconds  ?Total Procedure Duration: 0 hours 5 minutes 53 seconds  ?Findings: ?     The perianal and digital rectal examinations were normal. ?     A large amount of semi-liquid stool was found in the entire colon,  ?     precluding visualization. Lavage of the area was performed using copious  ?     amounts of sterile water, resulting in incomplete clearance with  ?     continued poor visualization. ?Impression:               - Preparation of the colon was inadequate. ?                          - Stool in the entire examined colon. ?                          -  No specimens collected. ?Moderate Sedation: ?     Per Anesthesia Care ?Recommendation:           - Patient has a contact number available for  ?                          emergencies. The signs and symptoms of potential  ?                          delayed complications were discussed with the  ?                          patient. Return to normal activities tomorrow.  ?                          Written discharge instructions were provided to the  ?                          patient. ?                          - Resume previous diet. ?                          - Continue present medications. ?                          - Repeat colonoscopy in 3-6 months because the   ?                          bowel preparation was poor. ?                          - Return to GI clinic in 4 months. ?Procedure Code(s):        --- Professional --- ?                          K9381, Colorectal cancer screening; colonoscopy on  ?                          individual not meeting criteria for high risk ?Diagnosis Code(s):        --- Professional --- ?                          Z12.11, Encounter for screening for malignant  ?                          neoplasm of colon ?CPT copyright 2019 American Medical Association. All rights reserved. ?The codes documented in this report are preliminary and upon coder review may  ?be revised to meet current compliance requirements. ?Elon Alas. Abbey Chatters, DO ?Elon Alas. [place], DO ?09/25/2021 10:14:09 AM ?This report has been signed electronically. ?Number of Addenda: 0 ?

## 2021-09-25 NOTE — Telephone Encounter (Signed)
Anne Carney, ?Opal Sidles from [facility] phoned advising that the pt's results are in the chart and that they will need further evaluation with CT or MRI. ?

## 2021-09-26 LAB — SURGICAL PATHOLOGY

## 2021-09-27 ENCOUNTER — Telehealth: Payer: Self-pay

## 2021-09-27 NOTE — Telephone Encounter (Signed)
Kendra at [facility] called office, needs CT abd w/wo contrast scheduled for 09/29/21 needs PA.  ? ?PA form and clinical notes faxed to [employer]. ?

## 2021-09-27 NOTE — Telephone Encounter (Signed)
Received fax from [employer], CT abd w/wo contrast approved. Auth# [id number], valid 09/27/21-12/27/21. ? ?Tried to call Tillie Rung, LMOVM to inform her of PA. ?

## 2021-09-28 ENCOUNTER — Telehealth: Payer: Self-pay

## 2021-09-28 NOTE — Telephone Encounter (Signed)
DOS 10/20/2021 ? ?HAMMERTOE REPAIR 2-5 RT - [id number] ?CAPSULOTOMY, MPJ RELEASE 2-5 RT - 28270 ? ?[place] ? ?RECEIVED AUTH GOOD FROM 09/26/2021 - 12/26/2021. AUTH # [id number] ?

## 2021-09-29 ENCOUNTER — Ambulatory Visit (HOSPITAL_COMMUNITY)
Admission: RE | Admit: 2021-09-29 | Discharge: 2021-09-29 | Disposition: A | Discharge status: Home / Self Care | Payer: 59 | Source: Ambulatory Visit | Attending: Gastroenterology | Admitting: Gastroenterology

## 2021-09-29 DIAGNOSIS — N2889 Other specified disorders of kidney and ureter: Secondary | ICD-10-CM | POA: Diagnosis present

## 2021-09-29 MED ORDER — IOHEXOL 300 MG/ML  SOLN
100.0000 mL | Freq: Once | INTRAMUSCULAR | Status: AC | PRN
  Administered 2021-09-29: 100 mL via INTRAVENOUS

## 2021-10-01 NOTE — Interval H&P Note (Signed)
History and Physical Interval Note: ? ?10/01/2021 ?3:52 PM ? ?Anne Carney  has presented today for surgery, with the diagnosis of gerd, LUQ pain, dysphagia, nausea, increase LFT.  The various methods of treatment have been discussed with the patient and family. After consideration of risks, benefits and other options for treatment, the patient has consented to  Procedure(s) with comments: ?COLONOSCOPY WITH PROPOFOL (N/A) - 9:30am, ASA 3 ?ESOPHAGOGASTRODUODENOSCOPY (EGD) WITH PROPOFOL (N/A) ?BIOPSY as a surgical intervention.  The patient's history has been reviewed, patient examined, no change in status, stable for surgery.  I have reviewed the patient's chart and labs.  Questions were answered to the patient's satisfaction.   ? ? ?Eloise Harman ? ? ?

## 2021-10-02 ENCOUNTER — Encounter (HOSPITAL_COMMUNITY): Payer: Self-pay | Admitting: Internal Medicine

## 2021-10-20 ENCOUNTER — Other Ambulatory Visit: Payer: Self-pay | Admitting: Podiatry

## 2021-10-20 DIAGNOSIS — M2041 Other hammer toe(s) (acquired), right foot: Secondary | ICD-10-CM

## 2021-10-20 MED ORDER — OXYCODONE-ACETAMINOPHEN 5-325 MG PO TABS: 1.0000 | ORAL_TABLET | Freq: Four times a day (QID) | ORAL | 0 refills | Status: AC | PRN

## 2021-10-20 NOTE — Progress Notes (Unsigned)
5/12 R foot hammertoes ?

## 2021-10-26 ENCOUNTER — Ambulatory Visit (INDEPENDENT_AMBULATORY_CARE_PROVIDER_SITE_OTHER): Payer: 59

## 2021-10-26 ENCOUNTER — Ambulatory Visit (INDEPENDENT_AMBULATORY_CARE_PROVIDER_SITE_OTHER): Payer: 59 | Admitting: Podiatry

## 2021-10-26 ENCOUNTER — Encounter: Payer: Self-pay | Admitting: Podiatry

## 2021-10-26 DIAGNOSIS — M2041 Other hammer toe(s) (acquired), right foot: Secondary | ICD-10-CM

## 2021-10-27 LAB — GAMMA GT: GGT: 163 IU/L — ABNORMAL HIGH (ref 0–60)

## 2021-10-29 ENCOUNTER — Encounter: Payer: Self-pay | Admitting: Podiatry

## 2021-10-29 NOTE — Progress Notes (Signed)
  Subjective:  Patient ID: Anne Carney, female    DOB: [birth year],  MRN: [id number]  Chief Complaint  Patient presents with   Routine Post Op    POV #1 DOS 10/20/2021 HAMMERTOE CORRECTION 2-5 RT     46 y.o. female returns for post-op check.  Doing well she did catch the foot on something and it caught the pin  Review of Systems: Negative except as noted in the HPI. Denies N/V/F/Ch.   Objective:  There were no vitals filed for this visit. There is no height or weight on file to calculate BMI. Constitutional Well developed. Well nourished.  Vascular Foot warm and well perfused. Capillary refill normal to all digits.  Calf is soft and supple, no posterior calf or knee pain, negative Homans' sign  Neurologic Normal speech. Oriented to person, place, and time. Epicritic sensation to light touch grossly present bilaterally.  Dermatologic Skin healing well without signs of infection. Skin edges well coapted without signs of infection.  Orthopedic: Tenderness to palpation noted about the surgical site.  Kirschner wire has translated distally   Multiple view plain film radiographs: Alignment and correction maintained the Kirschner wire in the second toe has translated distally Assessment:   1. Hammertoe of right foot    Plan:  Patient was evaluated and treated and all questions answered.  S/p foot surgery right -Progressing as expected post-operatively. -XR: Noted above.  The Kirschner wire in the second toe was translated distally.  I cut the wire short and reapplied the pin And splint the toe to the great toe with Coban -WB Status: WBAT in postop shoe -Sutures: Plan remove at next visit. -Medications: No refills required -Foot redressed.  Return in about 2 weeks (around 11/09/2021) for post op (new x-rays), suture removal.

## 2021-11-09 ENCOUNTER — Ambulatory Visit (INDEPENDENT_AMBULATORY_CARE_PROVIDER_SITE_OTHER): Payer: 59 | Admitting: Podiatry

## 2021-11-09 ENCOUNTER — Encounter: Payer: Self-pay | Admitting: Podiatry

## 2021-11-09 DIAGNOSIS — M2041 Other hammer toe(s) (acquired), right foot: Secondary | ICD-10-CM

## 2021-11-09 NOTE — Progress Notes (Signed)
  Subjective:  Patient ID: Anne Carney, female    DOB: 11-Aug-1975,  MRN: [id number]  Chief Complaint  Patient presents with   Routine Post Op    Patient is here for #2 POV for right foot.     46 y.o. female returns for post-op check.  Since last visit the fourth toe pin has come out  Review of Systems: Negative except as noted in the HPI. Denies N/V/F/Ch.   Objective:  There were no vitals filed for this visit. There is no height or weight on file to calculate BMI. Constitutional Well developed. Well nourished.  Vascular Foot warm and well perfused. Capillary refill normal to all digits.  Calf is soft and supple, no posterior calf or knee pain, negative Homans' sign  Neurologic Normal speech. Oriented to person, place, and time. Epicritic sensation to light touch grossly present bilaterally.  Dermatologic Skin healing well without signs of infection. Skin edges well coapted without signs of infection.  Orthopedic: Edema has improved minimal tenderness.  Second toe Kirschner wire has not moved.  Fourth toes Kirschner wire has been removed   Multiple view plain film radiographs: Alignment and correction maintained the Kirschner wire in the second toe has translated distally Assessment:   1. Hammertoe of right foot    Plan:  Patient was evaluated and treated and all questions answered.  S/p foot surgery right -All sutures removed today.  He may resume regular bathing and apply triple antibiotic ointment to pin sites before and after bathing.  Unfortunately the fourth toe pin has been removed since last visit.  Hopefully does not cause major issues for her.  Continue WBAT in the surgical shoe.  I will see her back in 3 weeks for new radiographs and removal of remaining pins.  Return in about 3 weeks (around 11/30/2021) for post op (new x-rays), pin removal.

## 2021-11-28 ENCOUNTER — Ambulatory Visit: Payer: 59 | Admitting: Gastroenterology

## 2021-11-28 ENCOUNTER — Encounter: Payer: Self-pay | Admitting: Gastroenterology

## 2021-11-28 VITALS — BP 134/80 | HR 95 | Temp 98.1°F | Ht 65.0 in | Wt 161.4 lb

## 2021-11-28 DIAGNOSIS — R6881 Early satiety: Secondary | ICD-10-CM | POA: Diagnosis not present

## 2021-11-28 DIAGNOSIS — K5900 Constipation, unspecified: Secondary | ICD-10-CM | POA: Diagnosis not present

## 2021-11-28 DIAGNOSIS — R1012 Left upper quadrant pain: Secondary | ICD-10-CM | POA: Diagnosis not present

## 2021-11-28 DIAGNOSIS — K219 Gastro-esophageal reflux disease without esophagitis: Secondary | ICD-10-CM | POA: Diagnosis not present

## 2021-11-28 DIAGNOSIS — R7989 Other specified abnormal findings of blood chemistry: Secondary | ICD-10-CM

## 2021-11-28 MED ORDER — PANTOPRAZOLE SODIUM 40 MG PO TBEC: 40.0000 mg | DELAYED_RELEASE_TABLET | Freq: Two times a day (BID) | ORAL | 5 refills | Status: DC

## 2021-11-28 NOTE — Progress Notes (Signed)
GI Office Note    Referring Provider: Jerel Shepherd, FNP Primary Care Physician:  Jerel Shepherd, [place]  Primary Gastroenterologist: Elon Alas. Abbey Chatters, DO   Chief Complaint   Chief Complaint  Patient presents with   Follow-up    Still having abdominal pain, states that things are pretty much the same as last visit.     History of Present Illness   Anne Carney is a 46 y.o. female presenting today for follow-up.  She was seen back in March for GERD, left upper quadrant pain.  Complained of dysphagia, gastroparesis type symptoms.  History of diabetes for over 30 years, abnormal LFTs.  No prior EGD and colonoscopy.  Since last visit she completed EGD and colonoscopy as outlined below.  She needs repeat colonoscopy due to poor bowel prep.  Labs for elevated LFTs March 2023, LFTs normal except for alk phos of 251.  Hep B and C screen negative.  AMA less than 20.  Platelets slightly low, 142,000.  No iron overload or trend towards iron deficiency.  A1c was 11.5.  Labs from May 2023: GGT 163.  Abdominal ultrasound April 2023: Liver unremarkable, 3.7 cm left renal upper pole mass noted.  CT abdomen with and without contrast April 2023 for renal mass: Liver unremarkable.  No renal mass seen.  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   Today: has surgery on her hammertoe last month. Still wearing a boot. Progressing as expected per patient. She states she was unable to completely finished bowel prep Myrna Blazer) for her colonoscopy. Denies vomiting. She is not sure why she didn't finish it. She states she did follow diet, clear liquids as required. She has had a lot going on with her health and her daughter's health, Jasmine Murchinson, who is a patient here. She has been in hospital for 3 weeks and now on dialysis.   Patient is having some constipation now. BM every couple of days. Sometimes hard. No melena, brbpr. LUQ intermittent.  Happens off/on, can go a week without pain. No vomiting. Some nausea. Nasty belching. Pantoprazole helps heartburn some, takes at night but still having quite a bit of breakthrough symptoms.   Patient states she is supposed to be on janumet but she does not take regularly. She takes her insulin. States [facility] She admits that she does not follow diet as she should. She does not know how her sugars are day to day as she does not check them. Her last A1C was 11.5. She is considering seeing endocrinology locally.    EGD April 2023: - EZ-line regular, 35 cm from the incisors. - Erythematous mucosa in the antrum. Biopsied. - Normal duodenal bulb, first portion of the duodenum and second portion of the duodenum. -Gastric biopsy with mild nonspecific reactive gastropathy, no H. Pylori  Colonoscopy April 2023: - Preparation of the colon was inadequate. - Stool in the entire examined colon. - No specimens collected. -Attempt colonoscopy in 3 to 6 months.   Medications   Current Outpatient Medications  Medication Sig Dispense Refill   blood glucose meter kit and supplies KIT Dispense based on patient and insurance preference. Use to check sugar up ro four times daily as directed. 1 each 0   furosemide (LASIX) 20 MG tablet Take 20 mg by mouth daily as needed.     gabapentin (NEURONTIN) 100 MG capsule Take 1 capsule (100 mg total) by  mouth 2 (two) times daily. (Patient taking differently: Take 200 mg by mouth 2 (two) times daily. Take 2 100 mg tablets twice daily)     insulin glargine (LANTUS) 100 UNIT/ML injection Inject 0.38 mLs (38 Units total) into the skin 2 (two) times daily. 10 mL 11   lisinopril-hydrochlorothiazide (ZESTORETIC) 10-12.5 MG tablet Take 1 tablet by mouth daily.     meclizine (ANTIVERT) 25 MG tablet Take 1 tablet (25 mg total) by mouth every 8 (eight) hours as needed for dizziness. 30 tablet 0   ondansetron (ZOFRAN) 4 MG tablet Take 1 tablet (4 mg  total) by mouth every 4 (four) hours as needed for nausea or vomiting. 5 tablet 0   pantoprazole (PROTONIX) 40 MG tablet Take 1 tablet (40 mg total) by mouth daily. 30 tablet 5   No current facility-administered medications for this visit.    Allergies   Allergies as of 11/28/2021   (No Known Allergies)        Review of Systems   General: Negative for anorexia, weight loss, fever, chills, fatigue, weakness. ENT: Negative for hoarseness, difficulty swallowing , nasal congestion. CV: Negative for chest pain, angina, palpitations, dyspnea on exertion, peripheral edema.  Respiratory: Negative for dyspnea at rest, dyspnea on exertion, cough, sputum, wheezing.  GI: See history of present illness. GU:  Negative for dysuria, hematuria, urinary incontinence, urinary frequency, nocturnal urination.  Endo: Negative for unusual weight change.     Physical Exam   BP 134/80 (BP Location: Left Arm, Patient Position: Sitting, Cuff Size: Normal)   Pulse 95   Temp 98.1 F (36.7 C) (Temporal)   Ht '5\' 5"'  (1.651 m)   Wt 161 lb 6.4 oz (73.2 kg)   LMP 11/14/2021 (Approximate)   SpO2 99%   BMI 26.86 kg/m    General: Well-nourished, well-developed in no acute distress.  Eyes: No icterus. Mouth: Oropharyngeal mucosa moist and pink , no lesions erythema or exudate. Lungs: Clear to auscultation bilaterally.  Heart: Regular rate and rhythm, no murmurs rubs or gallops.  Abdomen: Bowel sounds are normal, nondistended, no hepatosplenomegaly or masses,  no abdominal bruits or hernia , no rebound or guarding. Mild luq tenderness. Rectal: not performed Extremities: No lower extremity edema. No clubbing or deformities. Neuro: Alert and oriented x 4   Skin: Warm and dry, no jaundice.   Psych: Alert and cooperative, normal mood and affect.  Labs   Lab Results  Component Value Date   CREATININE 1.07 (H) 09/04/2021   BUN 21 09/04/2021   NA 132 (L) 09/04/2021   K 4.4 09/04/2021   CL 92 (L)  09/04/2021   CO2 27 09/04/2021   Lab Results  Component Value Date   ALT 29 09/04/2021   AST 28 09/04/2021   GGT 163 (H) 10/26/2021   ALKPHOS 251 (H) 09/04/2021   BILITOT 0.3 09/04/2021   Lab Results  Component Value Date   WBC 7.5 09/04/2021   HGB 11.5 09/04/2021   HCT 36.3 09/04/2021   MCV 88 09/04/2021   PLT 142 (L) 09/04/2021   Lab Results  Component Value Date   IRON 42 09/04/2021   TIBC 417 09/04/2021   FERRITIN 19 09/04/2021   No results found for: "VITAMINB12" No results found for: "FOLATE"  Imaging Studies   No results found.  Assessment   GERD: still with significant breakthrough symptoms, possibly exacerbated by slow emptying stomach and noncompliance. Increase pantoprazole to 14m BID. Reinforced antireflux measures.   LUQ pain/early satiety/nausea: suspect underlying gastroparesis.  Encouraged tight glycemic control, she admits to noncompliance with diet.  She does not check her sugars on a regular basis.  Suspect she would benefit from assistance from endocrinology.  She will discuss further with PCP.  We discussed possibility of gastric emptying study but would like for her to try lifestyle/dietary modifications first as there really is not a great medication to provide a long-term basis for gastroparesis (i.e. Reglan) without potential side effects.  Abnormal LFTS: Isolated elevated alkaline phosphatase, elevated GGT.  Liver unremarkable on ultrasound and CT imaging.  Recently with hammertoe surgery likely not significantly affecting her elevated alk phos is as goes back quite some time.  We will recheck her LFTs again in 6 weeks.  If alk phos remains greater than 50% elevated, would consider MRCP.  Colon cancer screening: Colonoscopy could not be completed earlier this year due to poor bowel prep.  At this time patient like to postpone colonoscopy until after next office visit given ongoing health concerns and recent foot surgery.  PLAN   Fasting LFTs. If  AP still >50% elevated, then MRCP. GES if nausea, reflux, luq pain no better. Encouraged eating small meals every 2-3 hours, avoiding fatty/fried foods, avoid overeating.  For constipation, add miralax one capful daily as needed. Encouraged better control/compliance of her diabetes.  TCS when ready, consider after ov in 3 months.   Increase pantoprazole to 29m BID. Ov 3 months.     LLaureen Ochs LBobby Rumpf MA[place] [facility]

## 2021-11-28 NOTE — Patient Instructions (Addendum)
Please have your labs done first week of 01/2022. Please do them fasting, nothing to eat or drink for 8 hours.  Increase pantoprazole to '40mg'$  twice daily, once before breakfast and once before evening meal. New RX sent to your pharmacy.  For upper abdominal pain, nausea etc, try to eat small meals every 2-3 hours, avoid hard to digest food such as fatty or fried foods, do not overeat. Your symptoms may be secondary to a slow emptying stomach from your poorly controlled diabetes. If symptoms do not settle down, we could order a gastric emptying test to determine if this is the source. There is no good chronic medication to treat it without potential side effects so it's best to try and make lifestyle/dietary changes first.  For constipation, you can take miralax one capful once daily as  needed.  Return to the office in 3 months. We will schedule colonoscopy at that time.  Would recommend better control of your diabetes to try and prevent long term complications.   Gastroparesis  Gastroparesis is a condition in which food takes longer than normal to empty from the stomach. This condition is also known as delayed gastric emptying. It is usually a long-term (chronic) condition. There is no cure, but there are treatments and things that you can do at home to help relieve symptoms. Treating the underlying condition that causes gastroparesis can also help relieve symptoms. What are the causes? In many cases, the cause of this condition is not known. Possible causes include: A hormone (endocrine) disorder, such as hypothyroidism or diabetes. A nervous system disease, such as Parkinson's disease or multiple sclerosis. Cancer, infection, or surgery that affects the stomach or vagus nerve. The vagus nerve runs from your chest, through your neck, and to the lower part of your brain. A connective tissue disorder, such as scleroderma. Certain medicines. What increases the risk? You are more likely to develop  this condition if: You have certain disorders or diseases. These may include: An endocrine disorder. An eating disorder. Amyloidosis. Scleroderma. Parkinson's disease. Multiple sclerosis. Cancer or infection of the stomach or the vagus nerve. You have had surgery on your stomach or vagus nerve. You take certain medicines. You are female. What are the signs or symptoms? Symptoms of this condition include: Feeling full after eating very little or a loss of appetite. Nausea, vomiting, or heartburn. Bloating of your abdomen. Inconsistent blood sugar (glucose) levels on blood tests. Unexplained weight loss. Acid from the stomach coming up into the esophagus (gastroesophageal reflux). Sudden tightening (spasm) of the stomach, which can be painful. Symptoms may come and go. Some people may not notice any symptoms. How is this diagnosed? This condition is diagnosed with tests, such as: Tests that check how long it takes food to move through the stomach and intestines. These tests include: Upper gastrointestinal (GI) series. For this test, you drink a liquid that shows up well on X-rays, and then X-rays are taken of your intestines. Gastric emptying scintigraphy. For this test, you eat food that contains a small amount of radioactive material, and then scans are taken. Wireless capsule GI monitoring system. For this test, you swallow a pill (capsule) that records information about how foods and fluid move through your stomach. Gastric manometry. For this test, a tube is passed down your throat and into your stomach to measure electrical and muscular activity. Endoscopy. For this test, a long, thin tube with a camera and light on the end is passed down your throat and into  your stomach to check for problems in your stomach lining. Ultrasound. This test uses sound waves to create images of the inside of your body. This can help rule out gallbladder disease or pancreatitis as a cause of your  symptoms. How is this treated? There is no cure for this condition, but treatment and home care may relieve symptoms. Treatment may include: Treating the underlying cause. Managing your symptoms by making changes to your diet and exercise habits. Taking medicines to control nausea and vomiting and to stimulate stomach muscles. Getting food through a feeding tube in the hospital. This may be done in severe cases. Having surgery to insert a device called a gastric electrical stimulator into your body. This device helps improve stomach emptying and control nausea and vomiting. Follow these instructions at home: Take over-the-counter and prescription medicines only as told by your health care provider. Follow instructions from your health care provider about eating or drinking restrictions. Your health care provider may recommend that you: Eat smaller meals more often. Eat low-fat foods. Eat low-fiber forms of high-fiber foods. For example, eat cooked vegetables instead of raw vegetables. Have only liquid foods instead of solid foods. Liquid foods are easier to digest. Drink enough fluid to keep your urine pale yellow. Exercise as often as told by your health care provider. Keep all follow-up visits. This is important. Contact a health care provider if you: Notice that your symptoms do not improve with treatment. Have new symptoms. Get help right away if you: Have severe pain in your abdomen that does not improve with treatment. Have nausea that is severe or does not go away. Vomit every time you drink fluids. Summary Gastroparesis is a long-term (chronic) condition in which food takes longer than normal to empty from the stomach. Symptoms include nausea, vomiting, heartburn, bloating of your abdomen, and loss of appetite. Eating smaller portions, low-fat foods, and low-fiber forms of high-fiber foods may help you manage your symptoms. Get help right away if you have severe pain in your  abdomen. This information is not intended to replace advice given to you by your health care provider. Make sure you discuss any questions you have with your health care provider. Document Revised: 10/05/2019 Document Reviewed: 10/05/2019 Elsevier Patient Education  [place].

## 2021-11-29 ENCOUNTER — Other Ambulatory Visit: Payer: Self-pay

## 2021-11-29 DIAGNOSIS — R7989 Other specified abnormal findings of blood chemistry: Secondary | ICD-10-CM

## 2021-11-30 ENCOUNTER — Ambulatory Visit (INDEPENDENT_AMBULATORY_CARE_PROVIDER_SITE_OTHER): Payer: 59 | Admitting: Podiatry

## 2021-11-30 ENCOUNTER — Ambulatory Visit (INDEPENDENT_AMBULATORY_CARE_PROVIDER_SITE_OTHER): Payer: 59

## 2021-11-30 DIAGNOSIS — M2041 Other hammer toe(s) (acquired), right foot: Secondary | ICD-10-CM

## 2021-12-28 ENCOUNTER — Other Ambulatory Visit: Payer: Self-pay

## 2021-12-28 DIAGNOSIS — R7989 Other specified abnormal findings of blood chemistry: Secondary | ICD-10-CM

## 2022-01-11 DIAGNOSIS — R7989 Other specified abnormal findings of blood chemistry: Secondary | ICD-10-CM | POA: Diagnosis not present

## 2022-01-12 LAB — HEPATIC FUNCTION PANEL
ALT: 25 IU/L (ref 0–32)
AST: 37 IU/L (ref 0–40)
Albumin: 3.9 g/dL (ref 3.9–4.9)
Alkaline Phosphatase: 246 IU/L — ABNORMAL HIGH (ref 44–121)
Bilirubin Total: 0.5 mg/dL (ref 0.0–1.2)
Bilirubin, Direct: 0.19 mg/dL (ref 0.00–0.40)
Total Protein: 6.8 g/dL (ref 6.0–8.5)

## 2022-01-25 NOTE — Progress Notes (Signed)
GI Office Note    Referring Provider: Jerel Shepherd, FNP Primary Care Physician:  Jerel Shepherd, [place]  Primary Gastroenterologist: Elon Alas. Abbey Chatters, DO   Chief Complaint   Chief Complaint  Patient presents with   Follow-up    States that she is still having left side abdominal pain at times.     History of Present Illness   Anne Carney is a 46 y.o. female presenting today for follow up. Last seen in 11/2021. She has h/o GERD, LUQ pain, early satiety/nausea with 30 year history of DM.    Labs for elevated LFTs March 2023, LFTs normal except for alk phos of 251.  Hep B and C screen negative.  AMA less than 20.  Platelets slightly low, 142,000.  No iron overload or trend towards iron deficiency.  A1c was 11.5. Labs from May 2023: GGT 163.   Abdominal ultrasound April 2023: Liver unremarkable, 3.7 cm left renal upper pole mass noted.   CT abdomen with and without contrast April 2023 for renal mass: Liver unremarkable.  No renal mass seen.  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   She is due for short interval follow up colonoscopy due to poor bowel prep. She didn't finish the Trilyte prep and she doesn't remember why she didn't finish it. Denies intolerance. Recovered from hammertoe surgery. Recent labs showed normal LFTs except alk phos still elevated at 246. Planning on MRCP, but have not been able to reach patient. Weight fluctuates quite a bit. She weighed 149 pounds in 07/2021 up to 161 pounds today. Nausea and early satiety settled down. Burning type pain in the LUQ persistent. Intermittent. Lasting for few mintutes at a time and recurs throught the day. BM daily. No melena, brbpr. Heartburn off/on. Takes pantoprazole about once per week. Daughter is on dialysis, M/W/F.   EGD April 2023: - EZ-line regular, 35 cm from the incisors. - Erythematous mucosa in the antrum. Biopsied. - Normal duodenal bulb, first portion of the  duodenum and second portion of the duodenum. -Gastric biopsy with mild nonspecific reactive gastropathy, no H. Pylori   Colonoscopy April 2023: - Preparation of the colon was inadequate. - Stool in the entire examined colon. - No specimens collected. -Attempt colonoscopy in 3 to 6 months.   Medications   Current Outpatient Medications  Medication Sig Dispense Refill   blood glucose meter kit and supplies KIT Dispense based on patient and insurance preference. Use to check sugar up ro four times daily as directed. 1 each 0   furosemide (LASIX) 20 MG tablet Take 20 mg by mouth daily as needed.     gabapentin (NEURONTIN) 100 MG capsule Take 1 capsule (100 mg total) by mouth 2 (two) times daily. (Patient taking differently: Take 200 mg by mouth 2 (two) times daily. Take 2 100 mg tablets twice daily)     insulin glargine (LANTUS) 100 UNIT/ML injection Inject 0.38 mLs (38 Units total) into the skin 2 (two) times daily. 10 mL 11   lisinopril-hydrochlorothiazide (ZESTORETIC) 10-12.5 MG tablet Take 1 tablet by mouth daily.     meclizine (ANTIVERT) 25 MG tablet Take 1 tablet (25 mg total) by mouth every 8 (eight) hours as needed for dizziness. 30 tablet 0   ondansetron (ZOFRAN) 4 MG tablet Take 1 tablet (4 mg total) by mouth every 4 (four) hours as needed for nausea or vomiting. 5 tablet 0   pantoprazole (PROTONIX) 40 MG tablet  Take 1 tablet (40 mg total) by mouth 2 (two) times daily before a meal. (Patient taking differently: Take 40 mg by mouth daily as needed.) 60 tablet 5   No current facility-administered medications for this visit.    Allergies   Allergies as of 01/26/2022   (No Known Allergies)     Past Medical History   Past Medical History:  Diagnosis Date   Diabetes mellitus    Hypertension    Kidney disease    Neuropathy     Past Surgical History   Past Surgical History:  Procedure Laterality Date   BIOPSY  09/25/2021   Procedure: BIOPSY;  Surgeon: Eloise Harman,  DO;  Location: [facility];  Service: Endoscopy;;   CESAREAN SECTION     COLONOSCOPY WITH PROPOFOL N/A 09/25/2021   Procedure: COLONOSCOPY WITH PROPOFOL;  Surgeon: Eloise Harman, DO;  Location: [facility];  Service: Endoscopy;  Laterality: N/A;  9:30am, ASA 3   ESOPHAGOGASTRODUODENOSCOPY (EGD) WITH PROPOFOL N/A 09/25/2021   Procedure: ESOPHAGOGASTRODUODENOSCOPY (EGD) WITH PROPOFOL;  Surgeon: Eloise Harman, DO;  Location: [facility];  Service: Endoscopy;  Laterality: N/A;   TOE SURGERY Right    TOE SURGERY Left     Past Family History   Family History  Problem Relation Age of Onset   Huntington's disease Father    Asthma Mother    Diabetes Daughter     Past Social History   Social History   Socioeconomic History   Marital status: Divorced    Spouse name: Not on file   Number of children: Not on file   Years of education: Not on file   Highest education level: Not on file  Occupational History   Not on file  Tobacco Use   Smoking status: Never   Smokeless tobacco: Never  Substance and Sexual Activity   Alcohol use: Yes    Comment: occ.   Drug use: Yes    Types: Marijuana    Comment: occasionally   Sexual activity: Yes    Birth control/protection: None  Other Topics Concern   Not on file  Social History Narrative   Not on file   Social Determinants of Health   Financial Resource Strain: Not on file  Food Insecurity: Not on file  Transportation Needs: Not on file  Physical Activity: Not on file  Stress: Not on file  Social Connections: Not on file  Intimate Partner Violence: Not on file    Review of Systems   General: Negative for anorexia, weight loss, fever, chills, fatigue, weakness. ENT: Negative for hoarseness, difficulty swallowing , nasal congestion. CV: Negative for chest pain, angina, palpitations, dyspnea on exertion, +peripheral edema.  Respiratory: Negative for dyspnea at rest, dyspnea on exertion, cough, sputum, wheezing.  GI: See  history of present illness. GU:  Negative for dysuria, hematuria, urinary incontinence, urinary frequency, nocturnal urination. Menstrual cramps are worse than usual the older she gets. Endo: Negative for unusual weight change.     Physical Exam   BP (!) 153/77 (BP Location: Left Arm, Patient Position: Sitting, Cuff Size: Normal)   Pulse 100   Temp 98.2 F (36.8 C) (Temporal)   Ht _0  (1.626 m)   Wt 161 lb (73 kg)   LMP 12/26/2021 (Approximate)   SpO2 100%   BMI 27.64 kg/m    General: Well-nourished, well-developed in no acute distress.  Eyes: No icterus. Mouth: Oropharyngeal mucosa moist and pink , no lesions erythema or exudate. Lungs: Clear to  auscultation bilaterally.  Heart: Regular rate and rhythm, no murmurs rubs or gallops.  Abdomen: Bowel sounds are normal,  nondistended, no hepatosplenomegaly or masses,  no abdominal bruits or hernia , no rebound or guarding. Mild epigastric tenderness. Rectal: not performed Extremities: trace bilateral lower extremity edema. No clubbing or deformities. Neuro: Alert and oriented x 4   Skin: Warm and dry, no jaundice.   Psych: Alert and cooperative, normal mood and affect.  Labs   Lab Results  Component Value Date   CREATININE 1.07 (H) 09/04/2021   BUN 21 09/04/2021   NA 132 (L) 09/04/2021   K 4.4 09/04/2021   CL 92 (L) 09/04/2021   CO2 27 09/04/2021   Lab Results  Component Value Date   ALT 25 01/11/2022   AST 37 01/11/2022   GGT 163 (H) 10/26/2021   ALKPHOS 246 (H) 01/11/2022   BILITOT 0.5 01/11/2022   Lab Results  Component Value Date   WBC 7.5 09/04/2021   HGB 11.5 09/04/2021   HCT 36.3 09/04/2021   MCV 88 09/04/2021   PLT 142 (L) 09/04/2021    Imaging Studies   No results found.  Assessment   GERD: Taking pantoprazole about once per week.  Takes only when she has typical heartburn type symptoms.  Overall early satiety and nausea have improved but she continues to have intermittent upper abdominal  discomfort in the epigastric/left upper quadrant region.  May or may not be related to gastritis or atypical reflux symptoms.  Encouraged daily PPI for 2 to 3 weeks to see if this helps.  Reinforced antireflux measures.  LUQ pain: Burning type pain intermittently.  Does not occur daily.  Lasting for few minutes at a time but will happen several times in a day.  Abdominal exam benign today.  Encouraged daily PPI for a few weeks to see if this helps.  MRCP planned to evaluate elevated alkaline phosphatase.  We will update colonoscopy in the future.  Elevated alkaline phosphatase: Persistent elevation with elevated GGT as well.  Once I saw her AST minimally elevated in the 50 range over a year ago.  Somewhat nonspecific.  Given elevated GGT, alkaline phosphatase greater than 50% elevated, recommend MRCP.  Colon cancer screening: Poor bowel prep earlier this year, patient states she did not complete her bowel prep but denies any intolerance.  Plan for colonoscopy after MRCP completed.   PLAN    MRCP planned to evaluate abdominal pain and elevated liver blood work. Take pantoprazole 40m EVERY morning before breakfast for the next 2-3 weeks to see if this helps your abdominal pain.  Patient to call and schedule mammogram. Patient to contact PCP regarding menstrual cramps and to make sure she is up to date on pelvic exams/pap smears.  Once MRI is completed, we will move towards another colonoscopy since prep was poor last time. Patient admits to not taking entire prep but denies intolerance.   LLaureen Ochs LBobby Rumpf [facility]

## 2022-01-26 ENCOUNTER — Encounter: Payer: Self-pay | Admitting: Gastroenterology

## 2022-01-26 ENCOUNTER — Ambulatory Visit (INDEPENDENT_AMBULATORY_CARE_PROVIDER_SITE_OTHER): Payer: 59 | Admitting: Gastroenterology

## 2022-01-26 ENCOUNTER — Telehealth: Payer: Self-pay | Admitting: *Deleted

## 2022-01-26 ENCOUNTER — Encounter: Payer: Self-pay | Admitting: *Deleted

## 2022-01-26 VITALS — BP 153/77 | HR 100 | Temp 98.2°F | Ht 64.0 in | Wt 161.0 lb

## 2022-01-26 DIAGNOSIS — R1012 Left upper quadrant pain: Secondary | ICD-10-CM | POA: Diagnosis not present

## 2022-01-26 DIAGNOSIS — R748 Abnormal levels of other serum enzymes: Secondary | ICD-10-CM | POA: Diagnosis not present

## 2022-01-26 DIAGNOSIS — K219 Gastro-esophageal reflux disease without esophagitis: Secondary | ICD-10-CM

## 2022-01-26 NOTE — Patient Instructions (Signed)
MRI planned to evaluate your abdominal pain and elevated liver blood work. Take your pantoprazole '40mg'$  EVERY morning before breakfast for the next 2-3 weeks to see if this helps your abdominal pain.  Please call [phone] to schedule your mammogram. Please make sure you stay up to date on your pap smears.  Once your MRI is completed, we will move towards another colonoscopy since your last one was not complete (the prep was poor and all the surfaces of your colon could not be seen).

## 2022-01-26 NOTE — Telephone Encounter (Signed)
Pt is scheduled for MRCP on 02/13/22 at 8:00 am . Pt will need to arrive at 7:45 am. Nothing to eat or drink 4 hours prior to procedure.

## 2022-02-13 ENCOUNTER — Ambulatory Visit (HOSPITAL_COMMUNITY): Admission: RE | Admit: 2022-02-13 | Payer: 59 | Source: Ambulatory Visit

## 2022-02-13 ENCOUNTER — Telehealth: Payer: Self-pay | Admitting: *Deleted

## 2022-02-13 ENCOUNTER — Encounter (HOSPITAL_COMMUNITY): Payer: Self-pay

## 2022-02-13 NOTE — Telephone Encounter (Signed)
you have been designated by your patient to obtain a prior authorization, [employer] is notifying you of this approval. This authorization is valid for 180 calendar days, it expires 08/12/2022 and is not a guarantee of payment. The case number is [id number]

## 2022-03-05 ENCOUNTER — Telehealth: Payer: Self-pay | Admitting: *Deleted

## 2022-03-05 ENCOUNTER — Ambulatory Visit (HOSPITAL_COMMUNITY): Payer: 59

## 2022-03-05 NOTE — Telephone Encounter (Signed)
Received call from pt that her MRI appt was cancelled for today and was told we cancelled this. I advised we did not cancel this appt looks like the central scheduling scheduler cancelled this after appt was made. We were not made aware.  Called CS and pt has been rescheduled to 10/4, arrival 4:30pm, npo 4 hrs prior.  Called pt, LMOVM to call back

## 2022-03-14 ENCOUNTER — Ambulatory Visit (HOSPITAL_COMMUNITY)
Admission: RE | Admit: 2022-03-14 | Discharge: 2022-03-14 | Disposition: A | Discharge status: Home / Self Care | Payer: 59 | Source: Ambulatory Visit | Attending: Gastroenterology | Admitting: Gastroenterology

## 2022-03-14 ENCOUNTER — Other Ambulatory Visit: Payer: Self-pay | Admitting: Gastroenterology

## 2022-03-14 DIAGNOSIS — R1012 Left upper quadrant pain: Secondary | ICD-10-CM | POA: Insufficient documentation

## 2022-03-14 DIAGNOSIS — R748 Abnormal levels of other serum enzymes: Secondary | ICD-10-CM

## 2022-03-14 MED ORDER — GADOBUTROL 1 MMOL/ML IV SOLN
7.0000 mL | Freq: Once | INTRAVENOUS | Status: AC | PRN
  Administered 2022-03-14: 7 mL via INTRAVENOUS

## 2022-04-02 ENCOUNTER — Encounter: Payer: Self-pay | Admitting: *Deleted

## 2022-04-02 DIAGNOSIS — Z1211 Encounter for screening for malignant neoplasm of colon: Secondary | ICD-10-CM

## 2022-04-02 MED ORDER — PEG 3350-KCL-NA BICARB-NACL 420 G PO SOLR: 4000.0000 mL | Freq: Once | ORAL | 0 refills | Status: AC

## 2022-04-17 ENCOUNTER — Ambulatory Visit
Admission: EM | Admit: 2022-04-17 | Discharge: 2022-04-17 | Disposition: A | Discharge status: Home / Self Care | Payer: Self-pay | Attending: Nurse Practitioner | Admitting: Nurse Practitioner

## 2022-04-17 DIAGNOSIS — M545 Low back pain, unspecified: Secondary | ICD-10-CM

## 2022-04-17 LAB — POCT URINALYSIS DIP (MANUAL ENTRY)
Bilirubin, UA: NEGATIVE
Glucose, UA: >=1000 mg/dL — AB
Ketones, POC UA: NEGATIVE mg/dL
Leukocytes, UA: NEGATIVE
Nitrite, UA: NEGATIVE
Protein Ur, POC: NEGATIVE mg/dL
Spec Grav, UA: 1.01 (ref 1.010–1.025)
Urobilinogen, UA: 1 E.U./dL
pH, UA: 5.5 (ref 5.0–8.0)

## 2022-04-17 MED ORDER — IBUPROFEN 800 MG PO TABS: 800.0000 mg | ORAL_TABLET | Freq: Three times a day (TID) | ORAL | 0 refills | Status: DC | PRN

## 2022-04-17 MED ORDER — KETOROLAC TROMETHAMINE 30 MG/ML IJ SOLN
30.0000 mg | Freq: Once | INTRAMUSCULAR | Status: AC
  Administered 2022-04-17: 30 mg via INTRAMUSCULAR

## 2022-04-17 NOTE — Discharge Instructions (Addendum)
Take medication as prescribed.  Do not start taking ibuprofen until tomorrow as you have been given medication for your pain that she last until that time. Continue use of ice or heat.  Apply ice for pain or swelling, heat for spasm or stiffness.  Apply for 20 minutes, remove for 1 hour, then repeat is much as possible. Gentle stretching and range of motion exercises to help with back pain. Follow-up in the emergency department immediately if you develop numbness, weakness in your legs or feet, inability to walk, or severe back pain. As discussed, if your symptoms do not improve, please follow-up with your primary care physician for further evaluation.

## 2022-04-17 NOTE — ED Provider Notes (Signed)
[facility]    CSN: [id number] Arrival date & time: 04/17/22  1742      History   Chief Complaint Chief Complaint  Patient presents with   Back Pain         HPI Anne Carney is a 46 y.o. female.   The history is provided by the patient.   Patient presents with a 2-week history of right-sided low back pain.  She denies any known injury or trauma.  States that she has had symptoms of the same or similar nature in the past, but this time it is much worse.  She denies any, trauma, abdominal pain, flank pain, urinary frequency, urgency, hesitancy.  She states that the pain does sometimes radiate down her leg, but she does have type 2 diabetes that is not well controlled.  Pain worsens with bending, twisting, and turning.  She states that she has been told that she has neuropathy.  She states that she took oxycodone for her symptoms with no relief.  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   Abdominal wall cellulitis 11/21/2015   DM type 2 (diabetes mellitus, type 2) ([place]) 11/21/2015   Abscess 11/21/2015    Past Surgical History:  Procedure Laterality Date   BIOPSY  09/25/2021   Procedure: BIOPSY;  Surgeon: Eloise Harman, DO;  [facility];  Service: Endoscopy;;   CESAREAN SECTION     COLONOSCOPY WITH PROPOFOL N/A 09/25/2021   Procedure: COLONOSCOPY WITH PROPOFOL;  Surgeon: Eloise Harman, DO;  Location: [facility];  Service: Endoscopy;  Laterality: N/A;  9:30am, ASA 3    ESOPHAGOGASTRODUODENOSCOPY (EGD) WITH PROPOFOL N/A 09/25/2021   Procedure: ESOPHAGOGASTRODUODENOSCOPY (EGD) WITH PROPOFOL;  Surgeon: Eloise Harman, DO;  Location: [facility];  Service: Endoscopy;  Laterality: N/A;   TOE SURGERY Right    TOE SURGERY Left     OB History   No obstetric history on file.      Home Medications    Prior to Admission medications   Medication Sig Start Date End Date Taking? Authorizing Provider  blood glucose meter kit and supplies KIT Dispense based on patient and insurance preference. Use to check sugar up ro four times daily as directed. 10/24/20   Barton Dubois, MD  furosemide (LASIX) 20 MG tablet Take 20 mg by mouth daily as needed. 09/28/20   [provider]  gabapentin (NEURONTIN) 100 MG capsule Take 1 capsule (100 mg total) by mouth 2 (two) times daily. Patient taking differently: Take 200 mg by mouth 2 (two) times daily. Take 2 100 mg tablets twice daily 10/24/20   Barton Dubois, MD  insulin glargine (LANTUS) 100 UNIT/ML injection Inject 0.38 mLs (38 Units total) into the skin 2 (two) times daily. 10/24/20   Barton Dubois, MD  lisinopril-hydrochlorothiazide (ZESTORETIC) 10-12.5 MG tablet Take 1 tablet by mouth daily. 09/02/20   [provider]  meclizine (ANTIVERT) 25 MG tablet Take 1 tablet (25 mg total) by mouth every 8 (eight)  hours as needed for dizziness. 10/24/20   Barton Dubois, MD  ondansetron (ZOFRAN) 4 MG tablet Take 1 tablet (4 mg total) by mouth every 4 (four) hours as needed for nausea or vomiting. 07/29/21   Wynona Dove A, DO  pantoprazole (PROTONIX) 40 MG tablet Take 1 tablet (40 mg total) by mouth 2 (two) times daily before a meal. Patient taking differently: Take 40 mg by mouth daily as needed. 11/28/21 05/27/22  Mahala Menghini, PA-C    Family History Family History  Problem Relation Age of Onset   Huntington's disease Father    Asthma Mother    Diabetes Daughter     Social History Social History   Tobacco  Use   Smoking status: Never   Smokeless tobacco: Never  Substance Use Topics   Alcohol use: Yes    Comment: occ.   Drug use: Yes    Types: Marijuana    Comment: occasionally     Allergies   Patient has no known allergies.   Review of Systems Review of Systems Per HPI  Physical Exam Triage Vital Signs ED Triage Vitals  Enc Vitals Group     BP 04/17/22 1748 133/80     Pulse Rate 04/17/22 1748 96     Resp 04/17/22 1748 18     Temp 04/17/22 1748 98.4 F (36.9 C)     Temp Source 04/17/22 1748 Oral     SpO2 04/17/22 1748 98 %     Weight --      Height --      Head Circumference --      Peak Flow --      Pain Score 04/17/22 1751 7     Pain Loc --      Pain Edu? --      Excl. in [place]? --    No data found.  Updated Vital Signs BP 133/80 (BP Location: Right Arm)   Pulse 96   Temp 98.4 F (36.9 C) (Oral)   Resp 18   LMP 04/12/2022 (Within Days)   SpO2 98%   Visual Acuity Right Eye Distance:   Left Eye Distance:   Bilateral Distance:    Right Eye Near:   Left Eye Near:    Bilateral Near:     Physical Exam Vitals and nursing note reviewed.  Constitutional:      General: She is not in acute distress.    Appearance: Normal appearance.  HENT:     Head: Normocephalic.  Cardiovascular:     Rate and Rhythm: Regular rhythm.     Pulses: Normal pulses.     Heart sounds: Normal heart sounds.  Pulmonary:     Effort: Pulmonary effort is normal.     Breath sounds: Normal breath sounds.  Abdominal:     General: Bowel sounds are normal.     Palpations: Abdomen is soft.  Musculoskeletal:     Lumbar back: Tenderness present. No swelling.  Skin:    General: Skin is warm and dry.  Neurological:     General: No focal deficit present.     Mental Status: She is alert and oriented to person, place, and time.  Psychiatric:        Mood and Affect: Mood normal.        Behavior: Behavior normal.      UC Treatments / Results  Labs (all labs ordered are listed, but  only abnormal results are displayed) Labs Reviewed  POCT URINALYSIS DIP (MANUAL ENTRY) - Abnormal; Notable  for the following components:      Result Value   Glucose, UA >=1,000 (*)    Blood, UA trace-intact (*)    All other components within normal limits    EKG   Radiology No results found.  Procedures Procedures (including critical care time)  Medications Ordered in UC Medications - No data to display  Initial Impression / Assessment and Plan / UC Course  I have reviewed the triage vital signs and the nursing notes.  Pertinent labs & imaging results that were available during my care of the patient were reviewed by me and considered in my medical decision making (see chart for details).  Patient presents for complaints of chronic right-sided low back pain.  She denies any new injury or trauma.  Patient is well-appearing, she is in no acute distress, vital signs are stable.  Difficult to ascertain the cause of the patient's symptoms at this time.  Urinalysis does not indicate a urinary tract infection.  Patient was given Toradol 30 mg IM injection for pain control.  Shared decision making with patient to defer imaging at this time.  Patient was also prescribed ibuprofen 800 mg for home use.  Supportive care recommendations were provided to the patient.  Patient verbalizes understanding.  All questions were answered.  Patient is stable for discharge. Final Clinical Impressions(s) / UC Diagnoses   Final diagnoses:  None   Discharge Instructions   None    ED Prescriptions   None    PDMP not reviewed this encounter.   Tish Men, NP 04/17/22 1821

## 2022-04-17 NOTE — ED Triage Notes (Signed)
Pt reports right sided lower back pain x 2 weeks. Oxycodone gives no relief.

## 2022-04-20 ENCOUNTER — Other Ambulatory Visit (HOSPITAL_COMMUNITY)
Admission: RE | Admit: 2022-04-20 | Discharge: 2022-04-20 | Disposition: A | Discharge status: Home / Self Care | Payer: 59 | Source: Ambulatory Visit | Attending: Internal Medicine | Admitting: Internal Medicine

## 2022-04-20 DIAGNOSIS — Z1211 Encounter for screening for malignant neoplasm of colon: Secondary | ICD-10-CM | POA: Diagnosis not present

## 2022-04-20 LAB — BASIC METABOLIC PANEL
Anion gap: 7 (ref 5–15)
BUN: 19 mg/dL (ref 6–20)
CO2: 26 mmol/L (ref 22–32)
Calcium: 8.7 mg/dL — ABNORMAL LOW (ref 8.9–10.3)
Chloride: 103 mmol/L (ref 98–111)
Creatinine, Ser: 0.75 mg/dL (ref 0.44–1.00)
GFR, Estimated: >60 mL/min (ref >60)
Glucose, Bld: 225 mg/dL — ABNORMAL HIGH (ref 70–99)
Potassium: 4.4 mmol/L (ref 3.5–5.1)
Sodium: 136 mmol/L (ref 135–145)

## 2022-04-20 LAB — PREGNANCY, URINE: Preg Test, Ur: NEGATIVE

## 2022-04-24 ENCOUNTER — Ambulatory Visit (HOSPITAL_COMMUNITY)
Admission: RE | Admit: 2022-04-24 | Discharge: 2022-04-24 | Disposition: A | Discharge status: Home / Self Care | Payer: 59 | Attending: Internal Medicine | Admitting: Internal Medicine

## 2022-04-24 ENCOUNTER — Other Ambulatory Visit: Payer: Self-pay

## 2022-04-24 ENCOUNTER — Ambulatory Visit (HOSPITAL_BASED_OUTPATIENT_CLINIC_OR_DEPARTMENT_OTHER): Payer: 59 | Admitting: Certified Registered Nurse Anesthetist

## 2022-04-24 ENCOUNTER — Encounter (HOSPITAL_COMMUNITY): Payer: Self-pay

## 2022-04-24 ENCOUNTER — Ambulatory Visit (HOSPITAL_COMMUNITY): Payer: 59 | Admitting: Certified Registered Nurse Anesthetist

## 2022-04-24 ENCOUNTER — Encounter (HOSPITAL_COMMUNITY)
Admission: RE | Disposition: A | Discharge status: Home / Self Care | Payer: Self-pay | Source: Home / Self Care | Attending: Internal Medicine

## 2022-04-24 DIAGNOSIS — G629 Polyneuropathy, unspecified: Secondary | ICD-10-CM | POA: Insufficient documentation

## 2022-04-24 DIAGNOSIS — Z1211 Encounter for screening for malignant neoplasm of colon: Secondary | ICD-10-CM

## 2022-04-24 DIAGNOSIS — K219 Gastro-esophageal reflux disease without esophagitis: Secondary | ICD-10-CM | POA: Insufficient documentation

## 2022-04-24 DIAGNOSIS — Z1212 Encounter for screening for malignant neoplasm of rectum: Secondary | ICD-10-CM

## 2022-04-24 DIAGNOSIS — Z794 Long term (current) use of insulin: Secondary | ICD-10-CM | POA: Diagnosis not present

## 2022-04-24 DIAGNOSIS — D124 Benign neoplasm of descending colon: Secondary | ICD-10-CM | POA: Diagnosis not present

## 2022-04-24 DIAGNOSIS — I10 Essential (primary) hypertension: Secondary | ICD-10-CM | POA: Insufficient documentation

## 2022-04-24 DIAGNOSIS — E119 Type 2 diabetes mellitus without complications: Secondary | ICD-10-CM | POA: Insufficient documentation

## 2022-04-24 HISTORY — PX: COLONOSCOPY WITH PROPOFOL: SHX5780

## 2022-04-24 HISTORY — PX: POLYPECTOMY: SHX149

## 2022-04-24 LAB — GLUCOSE, CAPILLARY: Glucose-Capillary: 112 mg/dL — ABNORMAL HIGH (ref 70–99)

## 2022-04-24 SURGERY — COLONOSCOPY WITH PROPOFOL
Anesthesia: General

## 2022-04-24 MED ORDER — PROPOFOL 10 MG/ML IV BOLUS
INTRAVENOUS | Status: DC | PRN
  Administered 2022-04-24 (×5): 20 mg via INTRAVENOUS

## 2022-04-24 MED ORDER — PROPOFOL 500 MG/50ML IV EMUL
INTRAVENOUS | Status: DC | PRN
  Administered 2022-04-24: 150 ug/kg/min via INTRAVENOUS

## 2022-04-24 MED ORDER — LACTATED RINGERS IV SOLN: INTRAVENOUS | Status: DC

## 2022-04-24 MED ORDER — LIDOCAINE HCL (CARDIAC) PF 100 MG/5ML IV SOSY
PREFILLED_SYRINGE | INTRAVENOUS | Status: DC | PRN
  Administered 2022-04-24: 50 mg via INTRAVENOUS

## 2022-04-24 NOTE — Transfer of Care (Signed)
Immediate Anesthesia Transfer of Care Note  Patient: Anne Carney  Procedure(s) Performed: COLONOSCOPY WITH PROPOFOL POLYPECTOMY INTESTINAL  Patient Location: PACU  Anesthesia Type:MAC  Level of Consciousness: drowsy  Airway & Oxygen Therapy: Patient Spontanous Breathing and Patient connected to nasal cannula oxygen  Post-op Assessment: Report given to RN and Post -op Vital signs reviewed and stable  Post vital signs: Reviewed and stable  Last Vitals:  Vitals Value Taken Time  BP    Temp    Pulse    Resp    SpO2      Last Pain:  Vitals:   04/24/22 1122  TempSrc:   PainSc: 7       Patients Stated Pain Goal: 8 (26/20/35 5974)  Complications: No notable events documented.

## 2022-04-24 NOTE — Anesthesia Postprocedure Evaluation (Signed)
Anesthesia Post Note  Patient: Anne Carney  Procedure(s) Performed: COLONOSCOPY WITH PROPOFOL POLYPECTOMY INTESTINAL  Patient location during evaluation: Phase II Anesthesia Type: General Level of consciousness: awake and alert Pain management: pain level controlled Vital Signs Assessment: post-procedure vital signs reviewed and stable Respiratory status: spontaneous breathing, nonlabored ventilation, respiratory function stable and patient connected to nasal cannula oxygen Cardiovascular status: blood pressure returned to baseline and stable Postop Assessment: no apparent nausea or vomiting Anesthetic complications: no   No notable events documented.   Last Vitals:  Vitals:   04/24/22 1146 04/24/22 1150  BP: (!) 98/53 (!) 113/97  Pulse: 78 83  Resp: 17 (!) 22  Temp: 36.4 C   SpO2: 100% 100%    Last Pain:  Vitals:   04/24/22 1150  TempSrc:   PainSc: [place] Won Kreuzer

## 2022-04-24 NOTE — Discharge Instructions (Addendum)
  Colonoscopy Discharge Instructions  Read the instructions outlined below and refer to this sheet in the next few weeks. These discharge instructions provide you with general information on caring for yourself after you leave the hospital. Your doctor may also give you specific instructions. While your treatment has been planned according to the most current medical practices available, unavoidable complications occasionally occur.   ACTIVITY You may resume your regular activity, but move at a slower pace for the next 24 hours.  Take frequent rest periods for the next 24 hours.  Walking will help get rid of the air and reduce the bloated feeling in your belly (abdomen).  No driving for 24 hours (because of the medicine (anesthesia) used during the test).   Do not sign any important legal documents or operate any machinery for 24 hours (because of the anesthesia used during the test).  NUTRITION Drink plenty of fluids.  You may resume your normal diet as instructed by your doctor.  Begin with a light meal and progress to your normal diet. Heavy or fried foods are harder to digest and may make you feel sick to your stomach (nauseated).  Avoid alcoholic beverages for 24 hours or as instructed.  MEDICATIONS You may resume your normal medications unless your doctor tells you otherwise.  WHAT YOU CAN EXPECT TODAY Some feelings of bloating in the abdomen.  Passage of more gas than usual.  Spotting of blood in your stool or on the toilet paper.  IF YOU HAD POLYPS REMOVED DURING THE COLONOSCOPY: No aspirin products for 7 days or as instructed.  No alcohol for 7 days or as instructed.  Eat a soft diet for the next 24 hours.  FINDING OUT THE RESULTS OF YOUR TEST Not all test results are available during your visit. If your test results are not back during the visit, make an appointment with your caregiver to find out the results. Do not assume everything is normal if you have not heard from your  caregiver or the medical facility. It is important for you to follow up on all of your test results.  SEEK IMMEDIATE MEDICAL ATTENTION IF: You have more than a spotting of blood in your stool.  Your belly is swollen (abdominal distention).  You are nauseated or vomiting.  You have a temperature over 101.  You have abdominal pain or discomfort that is severe or gets worse throughout the day.   Your colonoscopy revealed 2 polyp(s) which I removed successfully. Await pathology results, my office will contact you. I recommend repeating colonoscopy in 5 years for surveillance purposes. Follow up with GI in 3 months. Office will call with appointment.  I hope you have a great rest of your week!  Elon Alas. Abbey Chatters, D.O. Gastroenterology and Hepatology [facility]

## 2022-04-24 NOTE — Op Note (Signed)
[facility] Patient Name: Anne Carney Procedure Date: 04/24/2022 11:10 AM MRN: [id number] Date of Birth: [birth year] Attending MD: Elon Alas. Abbey Chatters , [place], [id number] CSN: [id number] Age: 46 Admit Type: Outpatient Procedure:                Colonoscopy Indications:              Screening for colorectal malignant neoplasm Providers:                Elon Alas. Abbey Chatters, DO, Roselawn Page, Everardo [place] Referring MD:              Medicines:                See the Anesthesia note for documentation of the                            administered medications Complications:            No immediate complications. Estimated Blood Loss:     Estimated blood loss was minimal. Procedure:                Pre-Anesthesia Assessment:                           - The anesthesia plan was to use monitored                            anesthesia care (MAC).                           After obtaining informed consent, the colonoscope                            was passed under direct vision. Throughout the                            procedure, the patient's blood pressure, pulse, and                            oxygen saturations were monitored continuously. The                            [id number]-HQ190L ([id number]) scope was introduced through                            the anus and advanced to the the cecum, identified                            by appendiceal orifice and ileocecal valve. The                            colonoscopy was performed without difficulty. The                            patient tolerated the procedure well. The quality  of the bowel preparation was evaluated using the                            BBPS [facility] Bowel Preparation Scale) with scores                            of: Right Colon = 2 (minor amount of residual                            staining, small fragments of stool and/or opaque                            liquid, but  mucosa seen well), Transverse Colon = 2                            (minor amount of residual staining, small fragments                            of stool and/or opaque liquid, but mucosa seen                            well) and Left Colon = 2 (minor amount of residual                            staining, small fragments of stool and/or opaque                            liquid, but mucosa seen well). The total BBPS score                            equals 6. Fair. Scope In: 11:25:45 AM Scope Out: 11:43:44 AM Scope Withdrawal Time: 0 hours 13 minutes 25 seconds  Total Procedure Duration: 0 hours 17 minutes 59 seconds  Findings:      The perianal and digital rectal examinations were normal.      Two sessile polyps were found in the descending colon. The polyps were 3       to 5 mm in size. These polyps were removed with a cold snare. Resection       and retrieval were complete.      A moderate amount of semi-liquid stool was found in the entire colon,       making visualization difficult. Lavage of the area was performed using       copious amounts of sterile water, resulting in clearance with fair       visualization. Impression:               - Two 3 to 5 mm polyps in the descending colon,                            removed with a cold snare. Resected and retrieved.                           - Stool in the entire examined colon. Moderate Sedation:  Per Anesthesia Care Recommendation:           - Patient has a contact number available for                            emergencies. The signs and symptoms of potential                            delayed complications were discussed with the                            patient. Return to normal activities tomorrow.                            Written discharge instructions were provided to the                            patient.                           - Resume previous diet.                           - Continue present medications.                            - Await pathology results.                           - Repeat colonoscopy in 5 years for surveillance                            and borderline prep.                           - Return to GI clinic in 3 months. Procedure Code(s):        --- Professional ---                           [phone], Colonoscopy, flexible; with removal of                            tumor(s), polyp(s), or other lesion(s) by snare                            technique Diagnosis Code(s):        --- Professional ---                           Z12.11, Encounter for screening for malignant                            neoplasm of colon                           D12.4, Benign neoplasm of descending colon CPT copyright 2022 American Medical Association. All rights reserved. The codes documented in this report  are preliminary and upon coder review may  be revised to meet current compliance requirements. Elon Alas. Abbey Chatters, DO [place] Abbey Chatters, DO 04/24/2022 11:47:09 AM This report has been signed electronically. Number of Addenda: 0

## 2022-04-24 NOTE — Anesthesia Preprocedure Evaluation (Signed)
Anesthesia Evaluation  Patient identified by MRN, date of birth, ID band Patient awake    Reviewed: Allergy & Precautions, H&P , NPO status , Patient's Chart, lab work & pertinent test results, reviewed documented beta blocker date and time   Airway Mallampati: II  TM Distance: >3 FB Neck ROM: full    Dental no notable dental hx. (+) Missing   Pulmonary neg pulmonary ROS   Pulmonary exam normal breath sounds clear to auscultation       Cardiovascular Exercise Tolerance: Good hypertension, negative cardio ROS  Rhythm:regular Rate:Normal     Neuro/Psych  Neuromuscular disease (Peripheral neuropathy)  negative psych ROS   GI/Hepatic Neg liver ROS,GERD  ,,  Endo/Other  negative endocrine ROSdiabetes    Renal/GU   negative genitourinary   Musculoskeletal   Abdominal   Peds  Hematology negative hematology ROS (+)   Anesthesia Other Findings   Reproductive/Obstetrics negative OB ROS                             Anesthesia Physical Anesthesia Plan  ASA: 2  Anesthesia Plan: General   Post-op Pain Management:    Induction:   PONV Risk Score and Plan: TIVA  Airway Management Planned:   Additional Equipment:   Intra-op Plan:   Post-operative Plan:   Informed Consent: I have reviewed the patients History and Physical, chart, labs and discussed the procedure including the risks, benefits and alternatives for the proposed anesthesia with the patient or authorized representative who has indicated his/her understanding and acceptance.     Dental Advisory Given  Plan Discussed with: CRNA  Anesthesia Plan Comments:         Anesthesia Quick Evaluation

## 2022-04-24 NOTE — Anesthesia Procedure Notes (Signed)
Procedure Name: MAC Date/Time: 04/24/2022 11:27 AM  Performed by: Lieutenant Diego, CRNAPre-anesthesia Checklist: Patient identified, Emergency Drugs available, Suction available, Patient being monitored and Timeout performed Patient Re-evaluated:Patient Re-evaluated prior to induction Oxygen Delivery Method: Nasal cannula Preoxygenation: Pre-oxygenation with 100% oxygen Induction Type: IV induction

## 2022-04-24 NOTE — H&P (Signed)
Primary Care Physician:  Jerel Shepherd, FNP Primary Gastroenterologist:  Dr. Abbey Chatters  Pre-Procedure History & Physical: HPI:  Anne Carney is a 46 y.o. female is here for a colonoscopy for colon cancer screening purposes.  Patient denies any family history of colorectal cancer.  No melena or hematochezia.  No abdominal pain or unintentional weight loss.  No change in bowel habits.  Overall feels well from a GI standpoint.  Past Medical History:  Diagnosis Date   Diabetes mellitus    Hypertension    Kidney disease    Neuropathy     Past Surgical History:  Procedure Laterality Date   BIOPSY  09/25/2021   Procedure: BIOPSY;  Surgeon: Eloise Harman, DO;  Location: [facility];  Service: Endoscopy;;   CESAREAN SECTION     COLONOSCOPY WITH PROPOFOL N/A 09/25/2021   Procedure: COLONOSCOPY WITH PROPOFOL;  Surgeon: Eloise Harman, DO;  Location: [facility];  Service: Endoscopy;  Laterality: N/A;  9:30am, ASA 3   ESOPHAGOGASTRODUODENOSCOPY (EGD) WITH PROPOFOL N/A 09/25/2021   Procedure: ESOPHAGOGASTRODUODENOSCOPY (EGD) WITH PROPOFOL;  Surgeon: Eloise Harman, DO;  Location: [facility];  Service: Endoscopy;  Laterality: N/A;   TOE SURGERY Right    TOE SURGERY Left     Prior to Admission medications   Medication Sig Start Date End Date Taking? Authorizing Provider  furosemide (LASIX) 20 MG tablet Take 20 mg by mouth daily as needed. 09/28/20  Yes [provider]  gabapentin (NEURONTIN) 100 MG capsule Take 1 capsule (100 mg total) by mouth 2 (two) times daily. Patient taking differently: Take 200 mg by mouth 2 (two) times daily. Take 2 100 mg tablets twice daily 10/24/20  Yes Barton Dubois, MD  ibuprofen (ADVIL) 800 MG tablet Take 1 tablet (800 mg total) by mouth every 8 (eight) hours as needed. 04/17/22  Yes Leath-Warren, Alda Lea, NP  insulin glargine (LANTUS) 100 UNIT/ML injection Inject 0.38 mLs (38 Units total) into the skin 2 (two) times daily. 10/24/20  Yes  Barton Dubois, MD  lisinopril-hydrochlorothiazide (ZESTORETIC) 10-12.5 MG tablet Take 1 tablet by mouth daily. 09/02/20  Yes [provider]  pantoprazole (PROTONIX) 40 MG tablet Take 1 tablet (40 mg total) by mouth 2 (two) times daily before a meal. Patient taking differently: Take 40 mg by mouth daily as needed. 11/28/21 05/27/22 Yes Mahala Menghini, PA-C  blood glucose meter kit and supplies KIT Dispense based on patient and insurance preference. Use to check sugar up ro four times daily as directed. 10/24/20   Barton Dubois, MD  meclizine (ANTIVERT) 25 MG tablet Take 1 tablet (25 mg total) by mouth every 8 (eight) hours as needed for dizziness. 10/24/20   Barton Dubois, MD  ondansetron (ZOFRAN) 4 MG tablet Take 1 tablet (4 mg total) by mouth every 4 (four) hours as needed for nausea or vomiting. 07/29/21   Jeanell Sparrow, DO    Allergies as of 04/02/2022   (No Known Allergies)    Family History  Problem Relation Age of Onset   Huntington's disease Father    Asthma Mother    Diabetes Daughter     Social History   Socioeconomic History   Marital status: Divorced    Spouse name: Not on file   Number of children: Not on file   Years of education: Not on file   Highest education level: Not on file  Occupational History   Not on file  Tobacco Use   Smoking status: Never  Smokeless tobacco: Never  Substance and Sexual Activity   Alcohol use: Yes    Comment: occ.   Drug use: Yes    Types: Marijuana    Comment: occasionally   Sexual activity: Yes    Birth control/protection: None  Other Topics Concern   Not on file  Social History Narrative   Not on file   Social Determinants of Health   Financial Resource Strain: Not on file  Food Insecurity: Not on file  Transportation Needs: Not on file  Physical Activity: Not on file  Stress: Not on file  Social Connections: Not on file  Intimate Partner Violence: Not on file    Review of Systems: See HPI, otherwise  negative ROS  Physical Exam: Vital signs in last 24 hours: Temp:  [98.4 F (36.9 C)] 98.4 F (36.9 C) (11/14 1047) Pulse Rate:  [96] 96 (11/14 1047) Resp:  [16] 16 (11/14 1047) SpO2:  [100 %] 100 % (11/14 1047) Weight:  [73 kg] 73 kg (11/14 1047)   General:   Alert,  Well-developed, well-nourished, pleasant and cooperative in NAD Head:  Normocephalic and atraumatic. Eyes:  Sclera clear, no icterus.   Conjunctiva pink. Ears:  Normal auditory acuity. Nose:  No deformity, discharge,  or lesions. Mouth:  No deformity or lesions, dentition normal. Neck:  Supple; no masses or thyromegaly. Lungs:  Clear throughout to auscultation.   No wheezes, crackles, or rhonchi. No acute distress. Heart:  Regular rate and rhythm; no murmurs, clicks, rubs,  or gallops. Abdomen:  Soft, nontender and nondistended. No masses, hepatosplenomegaly or hernias noted. Normal bowel sounds, without guarding, and without rebound.   Msk:  Symmetrical without gross deformities. Normal posture. Extremities:  Without clubbing or edema. Neurologic:  Alert and  oriented x4;  grossly normal neurologically. Skin:  Intact without significant lesions or rashes. Cervical Nodes:  No significant cervical adenopathy. Psych:  Alert and cooperative. Normal mood and affect.  Impression/Plan: Anne Carney is here for a colonoscopy to be performed for colon cancer screening purposes.  The risks of the procedure including infection, bleed, or perforation as well as benefits, limitations, alternatives and imponderables have been reviewed with the patient. Questions have been answered. All parties agreeable.

## 2022-04-25 LAB — SURGICAL PATHOLOGY

## 2022-05-01 ENCOUNTER — Encounter (HOSPITAL_COMMUNITY): Payer: Self-pay | Admitting: *Deleted

## 2022-05-01 ENCOUNTER — Emergency Department (HOSPITAL_COMMUNITY): Payer: 59

## 2022-05-01 ENCOUNTER — Other Ambulatory Visit: Payer: Self-pay

## 2022-05-01 ENCOUNTER — Emergency Department (HOSPITAL_COMMUNITY)
Admission: EM | Admit: 2022-05-01 | Discharge: 2022-05-01 | Disposition: A | Discharge status: Home / Self Care | Payer: 59 | Attending: Emergency Medicine | Admitting: Emergency Medicine

## 2022-05-01 DIAGNOSIS — I10 Essential (primary) hypertension: Secondary | ICD-10-CM | POA: Diagnosis not present

## 2022-05-01 DIAGNOSIS — R1031 Right lower quadrant pain: Secondary | ICD-10-CM | POA: Diagnosis not present

## 2022-05-01 DIAGNOSIS — E1165 Type 2 diabetes mellitus with hyperglycemia: Secondary | ICD-10-CM | POA: Insufficient documentation

## 2022-05-01 DIAGNOSIS — E1140 Type 2 diabetes mellitus with diabetic neuropathy, unspecified: Secondary | ICD-10-CM | POA: Insufficient documentation

## 2022-05-01 DIAGNOSIS — R739 Hyperglycemia, unspecified: Secondary | ICD-10-CM

## 2022-05-01 DIAGNOSIS — N3289 Other specified disorders of bladder: Secondary | ICD-10-CM | POA: Diagnosis not present

## 2022-05-01 DIAGNOSIS — Z79899 Other long term (current) drug therapy: Secondary | ICD-10-CM | POA: Insufficient documentation

## 2022-05-01 DIAGNOSIS — Z794 Long term (current) use of insulin: Secondary | ICD-10-CM | POA: Diagnosis not present

## 2022-05-01 DIAGNOSIS — R109 Unspecified abdominal pain: Secondary | ICD-10-CM

## 2022-05-01 DIAGNOSIS — R1011 Right upper quadrant pain: Secondary | ICD-10-CM | POA: Diagnosis not present

## 2022-05-01 LAB — URINALYSIS, ROUTINE W REFLEX MICROSCOPIC
Bilirubin Urine: NEGATIVE
Glucose, UA: >=500 mg/dL — AB
Hgb urine dipstick: NEGATIVE
Ketones, ur: NEGATIVE mg/dL
Leukocytes,Ua: NEGATIVE
Nitrite: NEGATIVE
Protein, ur: NEGATIVE mg/dL
Specific Gravity, Urine: 1.018 (ref 1.005–1.030)
pH: 7 (ref 5.0–8.0)

## 2022-05-01 LAB — BASIC METABOLIC PANEL
Anion gap: 10 (ref 5–15)
BUN: 22 mg/dL — ABNORMAL HIGH (ref 6–20)
CO2: 26 mmol/L (ref 22–32)
Calcium: 9 mg/dL (ref 8.9–10.3)
Chloride: 93 mmol/L — ABNORMAL LOW (ref 98–111)
Creatinine, Ser: 0.87 mg/dL (ref 0.44–1.00)
GFR, Estimated: >60 mL/min (ref >60)
Glucose, Bld: 600 mg/dL (ref 70–99)
Potassium: 4.1 mmol/L (ref 3.5–5.1)
Sodium: 129 mmol/L — ABNORMAL LOW (ref 135–145)

## 2022-05-01 LAB — CBC
HCT: 32.7 % — ABNORMAL LOW (ref 36.0–46.0)
Hemoglobin: 11 g/dL — ABNORMAL LOW (ref 12.0–15.0)
MCH: 27.8 pg (ref 26.0–34.0)
MCHC: 33.6 g/dL (ref 30.0–36.0)
MCV: 82.6 fL (ref 80.0–100.0)
Platelets: 140 10*3/uL — ABNORMAL LOW (ref 150–400)
RBC: 3.96 MIL/uL (ref 3.87–5.11)
RDW: 13.2 % (ref 11.5–15.5)
WBC: 6.8 10*3/uL (ref 4.0–10.5)
nRBC: 0 % (ref 0.0–0.2)

## 2022-05-01 LAB — PREGNANCY, URINE: Preg Test, Ur: NEGATIVE

## 2022-05-01 LAB — CBG MONITORING, ED
Glucose-Capillary: 598 mg/dL (ref 70–99)
Glucose-Capillary: 453 mg/dL — ABNORMAL HIGH (ref 70–99)
Glucose-Capillary: 434 mg/dL — ABNORMAL HIGH (ref 70–99)

## 2022-05-01 MED ORDER — METHOCARBAMOL 500 MG PO TABS: 500.0000 mg | ORAL_TABLET | Freq: Two times a day (BID) | ORAL | 0 refills | Status: DC

## 2022-05-01 MED ORDER — SODIUM CHLORIDE 0.9 % IV BOLUS
1000.0000 mL | Freq: Once | INTRAVENOUS | Status: AC
  Administered 2022-05-01: 1000 mL via INTRAVENOUS

## 2022-05-01 MED ORDER — CEPHALEXIN 500 MG PO CAPS: 500.0000 mg | ORAL_CAPSULE | Freq: Four times a day (QID) | ORAL | 0 refills | Status: DC

## 2022-05-01 MED ORDER — IOHEXOL 300 MG/ML  SOLN
100.0000 mL | Freq: Once | INTRAMUSCULAR | Status: AC | PRN
  Administered 2022-05-01: 100 mL via INTRAVENOUS

## 2022-05-01 MED ORDER — MORPHINE SULFATE (PF) 4 MG/ML IV SOLN
4.0000 mg | Freq: Once | INTRAVENOUS | Status: AC
  Administered 2022-05-01: 4 mg via INTRAVENOUS
  Filled 2022-05-01: qty 1

## 2022-05-01 NOTE — ED Triage Notes (Signed)
Pt reports right side/back pain, went to urgent care 2 weeks ago for the same pain, pain hurts more when peeing.

## 2022-05-01 NOTE — ED Provider Notes (Signed)
[facility] EMERGENCY DEPARTMENT Provider Note   CSN: [id number] Arrival date & time: 05/01/22  1607     History  Chief Complaint  Patient presents with   Flank Pain    Anne Carney is a 46 y.o. female.  With history of uncontrolled diabetes, GERD, neuropathy, hypertension who presents to the ED for evaluation of right-sided flank pain.  Patient reports the symptoms began 2 weeks ago and have persisted since that time.  Pain is constant but worse when urinating.  Pain occasionally radiates down to the right lower quadrant.  Describes the pain as an intense ache.  Rates it at an 8 out of 10.  Pain seems to be worse at night.  Patient has tried oxycodone and ibuprofen without relief of her symptoms.  Denies other abdominal pain, nausea, vomiting, fevers, chills, chest pain, shortness of breath diarrhea, melena, hematochezia, dysuria.  Patient had a colonoscopy last week which showed 2 polyps, but was otherwise normal.  She states her blood sugars are usually around 200, however does not always take her insulin or check her blood sugar at home.  Has not checked her blood sugar today and has also not taken her insulin today.  Does not have a reason for why she occasionally does not take her insulin.   Flank Pain       Home Medications Prior to Admission medications   Medication Sig Start Date End Date Taking? Authorizing Provider  cephALEXin (KEFLEX) 500 MG capsule Take 1 capsule (500 mg total) by mouth 4 (four) times daily. 05/01/22  Yes Shantasia Hunnell, Grafton Folk, PA-C  methocarbamol (ROBAXIN) 500 MG tablet Take 1 tablet (500 mg total) by mouth 2 (two) times daily. 05/01/22  Yes Alixandra Alfieri, Grafton Folk, PA-C  blood glucose meter kit and supplies KIT Dispense based on patient and insurance preference. Use to check sugar up ro four times daily as directed. 10/24/20   Barton Dubois, MD  furosemide (LASIX) 20 MG tablet Take 20 mg by mouth daily as needed. 09/28/20   [provider]   gabapentin (NEURONTIN) 100 MG capsule Take 1 capsule (100 mg total) by mouth 2 (two) times daily. Patient taking differently: Take 200 mg by mouth 2 (two) times daily. Take 2 100 mg tablets twice daily 10/24/20   Barton Dubois, MD  ibuprofen (ADVIL) 800 MG tablet Take 1 tablet (800 mg total) by mouth every 8 (eight) hours as needed. 04/17/22   Leath-Warren, Alda Lea, NP  insulin glargine (LANTUS) 100 UNIT/ML injection Inject 0.38 mLs (38 Units total) into the skin 2 (two) times daily. 10/24/20   Barton Dubois, MD  lisinopril-hydrochlorothiazide (ZESTORETIC) 10-12.5 MG tablet Take 1 tablet by mouth daily. 09/02/20   [provider]  meclizine (ANTIVERT) 25 MG tablet Take 1 tablet (25 mg total) by mouth every 8 (eight) hours as needed for dizziness. 10/24/20   Barton Dubois, MD  ondansetron (ZOFRAN) 4 MG tablet Take 1 tablet (4 mg total) by mouth every 4 (four) hours as needed for nausea or vomiting. 07/29/21   Wynona Dove A, DO  pantoprazole (PROTONIX) 40 MG tablet Take 1 tablet (40 mg total) by mouth 2 (two) times daily before a meal. Patient taking differently: Take 40 mg by mouth daily as needed. 11/28/21 05/27/22  Mahala Menghini, PA-C      Allergies    Patient has no known allergies.    Review of Systems   Review of Systems  Genitourinary:  Positive for flank pain.  All other systems reviewed  and are negative.   Physical Exam Updated Vital Signs BP (!) 164/90 (BP Location: Right Arm)   Pulse 94   Temp 98.3 F (36.8 C) (Oral)   Resp 18   Ht _0  (1.651 m)   Wt 73 kg   LMP 04/12/2022 (Within Days)   SpO2 99%   BMI 26.79 kg/m  Physical Exam Vitals and nursing note reviewed.  Constitutional:      General: She is not in acute distress.    Appearance: Normal appearance. She is well-developed. She is not ill-appearing, toxic-appearing or diaphoretic.  HENT:     Head: Normocephalic and atraumatic.     Mouth/Throat:     Mouth: Mucous membranes are moist.     Pharynx:  Oropharynx is clear.  Eyes:     Extraocular Movements: Extraocular movements intact.     Conjunctiva/sclera: Conjunctivae normal.     Pupils: Pupils are equal, round, and reactive to light.  Cardiovascular:     Rate and Rhythm: Normal rate and regular rhythm.     Pulses: Normal pulses.     Heart sounds: No murmur heard. Pulmonary:     Effort: Pulmonary effort is normal. No respiratory distress.     Breath sounds: Normal breath sounds.  Abdominal:     General: Abdomen is flat.     Palpations: Abdomen is soft.     Tenderness: There is no abdominal tenderness. There is right CVA tenderness. There is no left CVA tenderness, guarding or rebound.  Musculoskeletal:        General: No swelling.     Cervical back: Neck supple.  Skin:    General: Skin is warm and dry.     Capillary Refill: Capillary refill takes less than 2 seconds.  Neurological:     General: No focal deficit present.     Mental Status: She is alert and oriented to person, place, and time.  Psychiatric:        Mood and Affect: Mood normal.     ED Results / Procedures / Treatments   Labs (all labs ordered are listed, but only abnormal results are displayed) Labs Reviewed  CBC - Abnormal; Notable for the following components:      Result Value   Hemoglobin 11.0 (*)    HCT 32.7 (*)    Platelets 140 (*)    All other components within normal limits  BASIC METABOLIC PANEL - Abnormal; Notable for the following components:   Sodium 129 (*)    Chloride 93 (*)    Glucose, Bld 600 (*)    BUN 22 (*)    All other components within normal limits  URINALYSIS, ROUTINE W REFLEX MICROSCOPIC - Abnormal; Notable for the following components:   Color, Urine   (*)    Value: PATIENT IDENTIFICATION ERROR. PLEASE DISREGARD RESULTS. ACCOUNT WILL BE CREDITED.   APPearance   (*)    Value: PATIENT IDENTIFICATION ERROR. PLEASE DISREGARD RESULTS. ACCOUNT WILL BE CREDITED.   Glucose, UA   (*)    Value: PATIENT IDENTIFICATION ERROR. PLEASE  DISREGARD RESULTS. ACCOUNT WILL BE CREDITED.   Hgb urine dipstick   (*)    Value: PATIENT IDENTIFICATION ERROR. PLEASE DISREGARD RESULTS. ACCOUNT WILL BE CREDITED.   Bilirubin Urine   (*)    Value: PATIENT IDENTIFICATION ERROR. PLEASE DISREGARD RESULTS. ACCOUNT WILL BE CREDITED.   Ketones, ur   (*)    Value: PATIENT IDENTIFICATION ERROR. PLEASE DISREGARD RESULTS. ACCOUNT WILL BE CREDITED.   Protein, ur   (*)  Value: PATIENT IDENTIFICATION ERROR. PLEASE DISREGARD RESULTS. ACCOUNT WILL BE CREDITED.   Nitrite   (*)    Value: PATIENT IDENTIFICATION ERROR. PLEASE DISREGARD RESULTS. ACCOUNT WILL BE CREDITED.   Leukocytes,Ua   (*)    Value: PATIENT IDENTIFICATION ERROR. PLEASE DISREGARD RESULTS. ACCOUNT WILL BE CREDITED.   All other components within normal limits  URINALYSIS, ROUTINE W REFLEX MICROSCOPIC - Abnormal; Notable for the following components:   Color, Urine STRAW (*)    Glucose, UA >=500 (*)    Bacteria, UA MANY (*)    All other components within normal limits  CBG MONITORING, ED - Abnormal; Notable for the following components:   Glucose-Capillary 598 (*)    All other components within normal limits  CBG MONITORING, ED - Abnormal; Notable for the following components:   Glucose-Capillary 453 (*)    All other components within normal limits  CBG MONITORING, ED - Abnormal; Notable for the following components:   Glucose-Capillary 434 (*)    All other components within normal limits  PREGNANCY, URINE    EKG None  Radiology CT Abdomen Pelvis W Contrast  Result Date: 05/01/2022 CLINICAL DATA:  Right upper quadrant abdominal pain. EXAM: CT ABDOMEN AND PELVIS WITH CONTRAST TECHNIQUE: Multidetector CT imaging of the abdomen and pelvis was performed using the standard protocol following bolus administration of intravenous contrast. RADIATION DOSE REDUCTION: This exam was performed according to the departmental dose-optimization program which includes automated exposure control,  adjustment of the mA and/or kV according to patient size and/or use of iterative reconstruction technique. CONTRAST:  16m OMNIPAQUE IOHEXOL 300 MG/ML  SOLN COMPARISON:  CT of the abdomen dated 09/29/2021. FINDINGS: Lower chest: The visualized lung bases are clear. No intra-abdominal free air or free fluid. Hepatobiliary: The liver is unremarkable. No biliary dilatation. The gallbladder is unremarkable. Pancreas: The pancreas is atrophic for age and poorly visualized. No active inflammatory changes. Spleen: Normal in size without focal abnormality. Adrenals/Urinary Tract: The adrenal glands unremarkable. There is no hydronephrosis on either side. There is symmetric enhancement and excretion contrast by both kidneys. The visualized ureters appear unremarkable. The urinary bladder is partially distended. Air within the bladder may have been introduced by a recent instrumentation. Correlation with urinalysis recommended to evaluate for cystitis. Stomach/Bowel: There is moderate stool throughout the colon. There is no bowel obstruction or active inflammation. The appendix is normal. Vascular/Lymphatic: The abdominal aorta and IVC unremarkable. No portal venous gas. There is no adenopathy. Reproductive: The uterus is anteverted. 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 There is a 4 cm partially exophytic anterior uterine fibroid. Ill-defined 3 cm cystic structure in the right posterior pelvis, likely arising from the right ovary. No follow-up. The left ovary is poorly visualized. Two tubal ligation clips noted in the region of the right adnexa likely representing displaced left tubal ligation clip. Other: None Musculoskeletal: No acute or significant osseous findings. IMPRESSION: 1. No acute intra-abdominal or pelvic pathology. 2. Moderate colonic stool burden. No bowel obstruction. Normal appendix. 3. Uterine fibroid. 4. Displaced left tubal ligation clip. Electronically Signed   By: AAnner CreteM.D.   On: 05/01/2022 20:42    Procedures Procedures    Medications Ordered in ED Medications  sodium chloride 0.9 % bolus 1,000 mL (0 mLs Intravenous Stopped 05/01/22 2151)  iohexol (OMNIPAQUE) 300 MG/ML solution 100 mL (100 mLs Intravenous Contrast Given 05/01/22 2021)  morphine (PF) 4 MG/ML injection 4 mg (4 mg Intravenous Given 05/01/22 2121)    ED  Course/ Medical Decision Making/ A&P Clinical Course as of 05/01/22 2308  Tue May 01, 2022  1840 Urinalysis, Routine w reflex microscopic Urine, Clean Catch(!) [AS]  2107 Sodium(!): 129 Corrected sodium 137 [AS]  2307 Reevaluation shows patient's pain is now a 3 out of 10 [AS]    Clinical Course User Index [AS] Maeson Purohit, Grafton Folk, PA-C                           Medical Decision Making Amount and/or Complexity of Data Reviewed Labs: ordered. Decision-making details documented in ED Course. Radiology: ordered.  Risk Prescription drug management.  This patient presents to the ED for concern of flank pain, this involves an extensive number of treatment options, and is a complaint that carries with it a high risk of complications and morbidity.  The differential diagnosis of emergent flank pain includes, but is not limited to :Abdominal aortic aneurysm,, Renal artery embolism,Renal vein thrombosis, Aortic dissection, Mesenteric, Pyelonephritis, Renal infarction, Renal hemorrhage, Nephrolithiasis/ Renal Colic, Bladder tumor,Cystitis, Biliary colic, Pancreatitis Perforated peptic ulcer Appendicitis ,Inguinal Hernia, Diverticulitis, Bowel obstruction Ectopic Pregnancy,PID/TOA,Ovarian cyst, Ovarian torsionShingles Lower lobe pneumonia, Retroperitoneal hematoma/abscess/tumor, Epidural abscess, Epidural hematoma    Co morbidities that complicate the patient evaluation   uncontrolled diabetes, GERD, neuropathy, hypertension  My initial workup includes abdominal pain labs, CT abdomen pelvis for  Additional history obtained  from: Nursing notes from this visit.  I ordered, reviewed and interpreted labs which include:BMP, CBC, urinalysis, urine pregnancy.  Initial glucose 600 and sodium 129.  Corrected sodium 137.  Urinalysis shows significant glucose and many bacteria   I ordered imaging studies including CT abdomen pelvis I independently visualized and interpreted imaging which showed no acute pathology.  Displaced left fallopian tube clip I agree with the radiologist interpretation  Afebrile, hemodynamically stable.  46 year old female presenting to the ED for evaluation of 2 weeks of right flank pain.  This pain is reproducible with forward flexion and twisting.  Physical exam remarkable for right-sided CVA tenderness, was otherwise unremarkable.  Lab workup remarkable for significant hyperglycemia.  This was improved with IV fluids.  Patient did not take her insulin today.  She does not have an anion gap and no signs of acidosis.  I instructed her to take her insulin when she gets home.  Urinalysis shows glucose and many bacteria.  Patient will be started on an antibiotic for this.  CT scan does not show any acute pathology.  Patient's symptoms are most likely musculoskeletal.  Also sent a prescription for Robaxin.  Will also give patient information for on-call gynecologist regarding her displaced fallopian tube clip.  I have low suspicion for acute intra-abdominal emergencies.  Stable at discharge.  At this time there does not appear to be any evidence of an acute emergency medical condition and the patient appears stable for discharge with appropriate outpatient follow up. Diagnosis was discussed with patient who verbalizes understanding of care plan and is agreeable to discharge. I have discussed return precautions with patient who verbalizes understanding. Patient encouraged to follow-up with their PCP within 1 week. All questions answered.  Patient's case discussed with Dr. Rogene Houston who agrees with plan to  discharge with follow-up.   Note: Portions of this report may have been transcribed using voice recognition software. Every effort was made to ensure accuracy; however, inadvertent computerized transcription errors may still be present.          Final Clinical Impression(s) / ED Diagnoses Final  diagnoses:  Flank pain  Hyperglycemia    Rx / DC Orders ED Discharge Orders          Ordered    methocarbamol (ROBAXIN) 500 MG tablet  2 times daily        05/01/22 2307    cephALEXin (KEFLEX) 500 MG capsule  4 times daily        05/01/22 2307              Nehemiah Massed 05/01/22 2308    Fredia Sorrow, MD 05/02/22 [phone]

## 2022-05-01 NOTE — ED Notes (Addendum)
Lab called and stated ua collected at 1718 and updated at 1818 is not the correct ua results. Lab will take out billing for this pt.  UA from 1718hrs is correct.

## 2022-05-01 NOTE — Discharge Instructions (Signed)
You have been seen today for your complaint of flank pain. Your lab work showed high blood sugar, was otherwise reassuring. Your imaging showed no emergent intra-abdominal abnormalities. Your discharge medications include Robaxin.  This is a muscle relaxant.  You may take it as needed for pain in addition to Tylenol and ibuprofen.Marland Kitchen Keflex.  This is an antibiotic.  You should take it as prescribed.  You should take it for the entire duration of the prescription. Home care instructions are as follows:  Check your blood sugar.  Use insulin.  Eat a low sugar diet. Follow up with: Your primary care provider soon as possible Please seek immediate medical care if you develop any of the following symptoms: Your pain does not go away as soon as your health care provider told you to expect. You cannot stop vomiting. Your pain is only in areas of the abdomen, such as the right side or the left lower portion of the abdomen. Pain on the right side could be caused by appendicitis. You have bloody or black stools, or stools that look like tar. You have severe pain, cramping, or bloating in your abdomen. You have signs of dehydration, such as: Dark urine, very little urine, or no urine. Cracked lips. Dry mouth. Sunken eyes. Sleepiness. Weakness. You have trouble breathing or chest pain. At this time there does not appear to be the presence of an emergent medical condition, however there is always the potential for conditions to change. Please read and follow the below instructions.  Do not take your medicine if  develop an itchy rash, swelling in your mouth or lips, or difficulty breathing; call 911 and seek immediate emergency medical attention if this occurs.  You may review your lab tests and imaging results in their entirety on your MyChart account.  Please discuss all results of fully with your primary care provider and other specialist at your follow-up visit.  Note: Portions of this text may have  been transcribed using voice recognition software. Every effort was made to ensure accuracy; however, inadvertent computerized transcription errors may still be present.

## 2022-05-08 ENCOUNTER — Encounter (HOSPITAL_COMMUNITY): Payer: Self-pay | Admitting: Internal Medicine

## 2022-05-16 IMAGING — CT CT ABDOMEN WO/W CM
3 of 13 series · 12 of 46 positions shown, 18 images · IV contrast (agent unspecified)
Comparison: Ultrasound on 09/22/2021

CLINICAL DATA: Possible left renal mass on recent ultrasound.

EXAM:
CT ABDOMEN WITHOUT AND WITH CONTRAST
TECHNIQUE: Multidetector CT imaging of the abdomen was performed following the
standard protocol before and following the bolus administration of
intravenous contrast.

[Series 2: axial pre · axial · non-contrast · 0.82mm/px · z∈[+1218,+1386]mm · 5 of 86 slices shown, 10 images]
[im 15/86  soft-tissue]
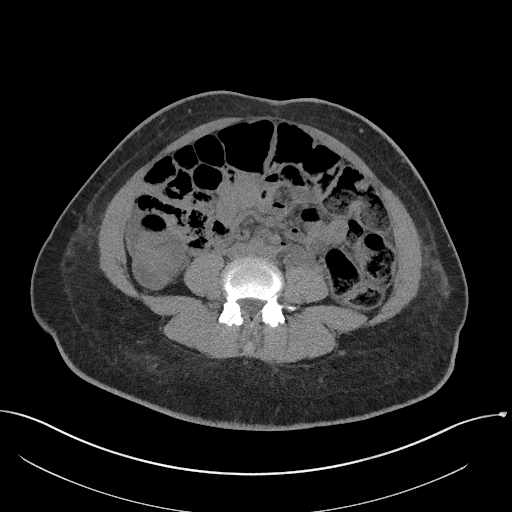
[im 15/86  bone]
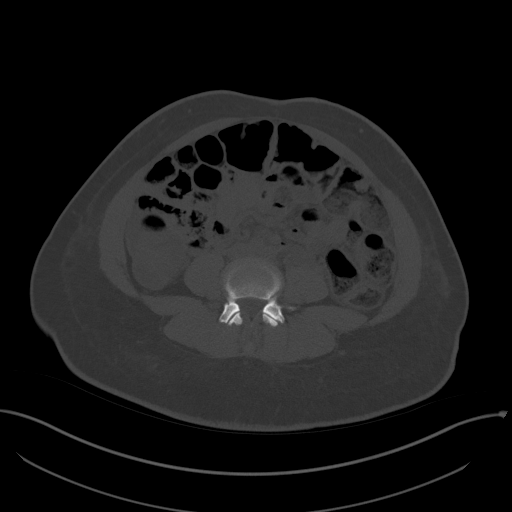
[im 29/86  soft-tissue]
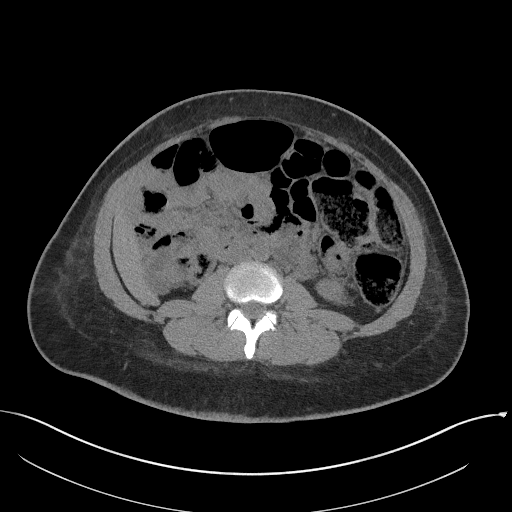
[im 29/86  lung]
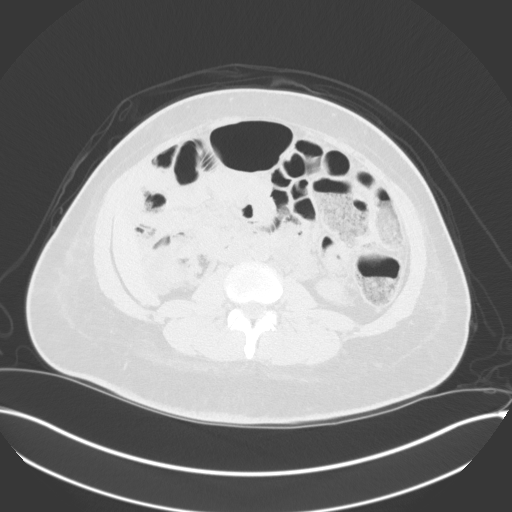
[im 43/86  soft-tissue]
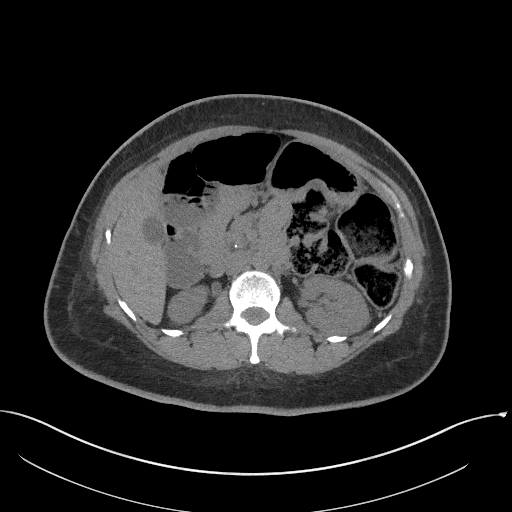
[im 43/86  lung]
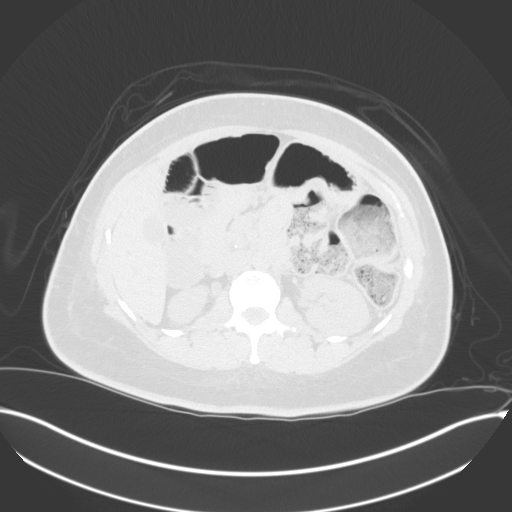
[im 57/86  soft-tissue]
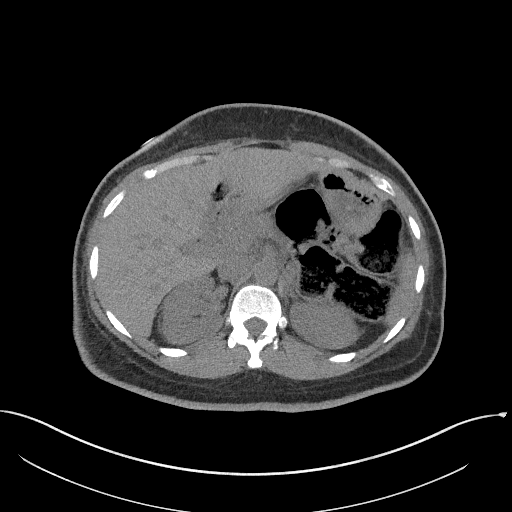
[im 57/86  lung]
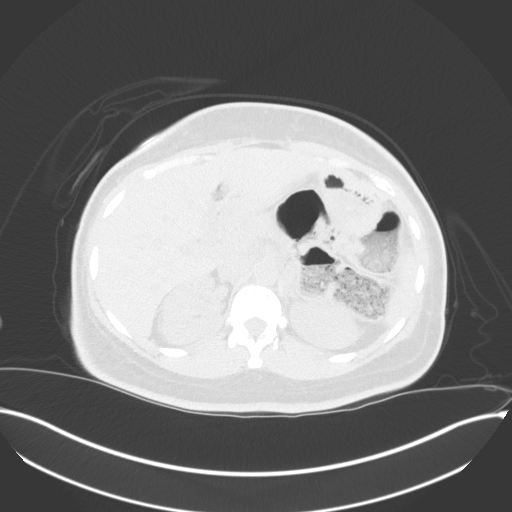
[im 71/86  soft-tissue]
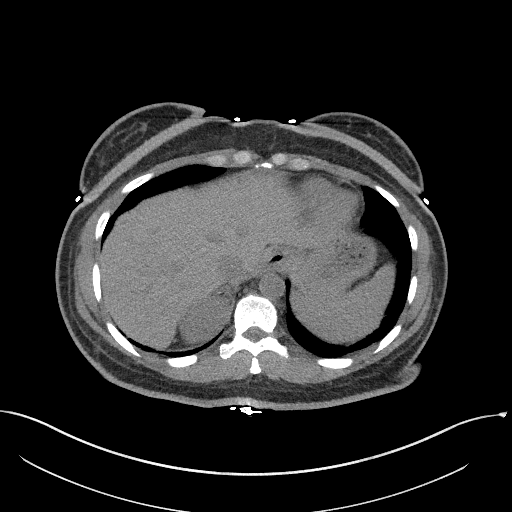
[im 71/86  lung]
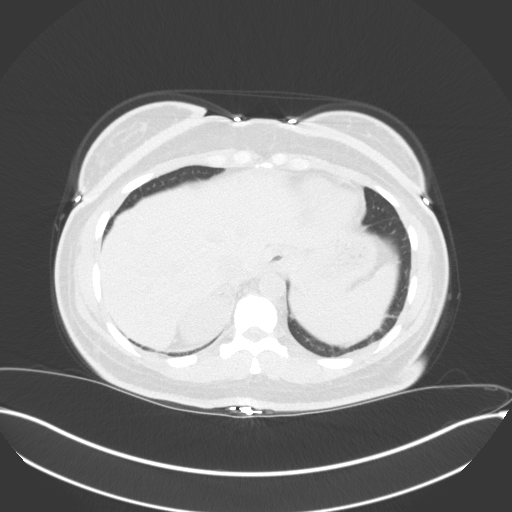

[Series 4: coronal pre · coronal · non-contrast · 0.50mm/px · 2 of 101 slices shown, 3 images]
[im 34/101  soft-tissue]
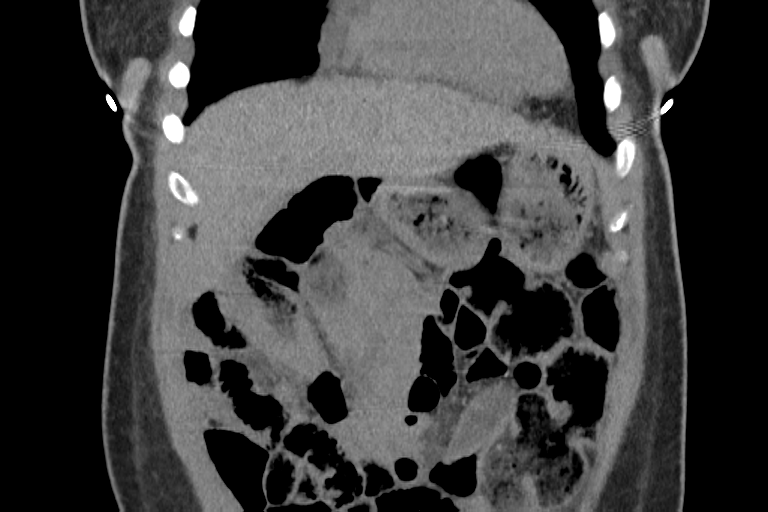
[im 34/101  bone]
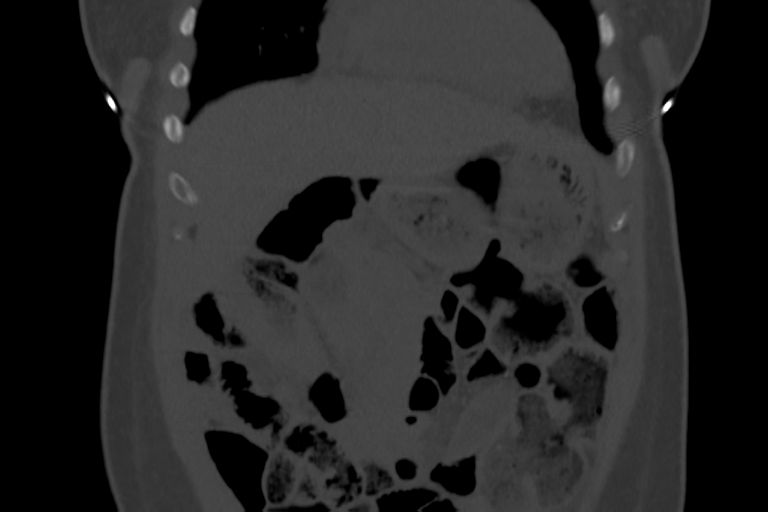
[im 67/101  soft-tissue]
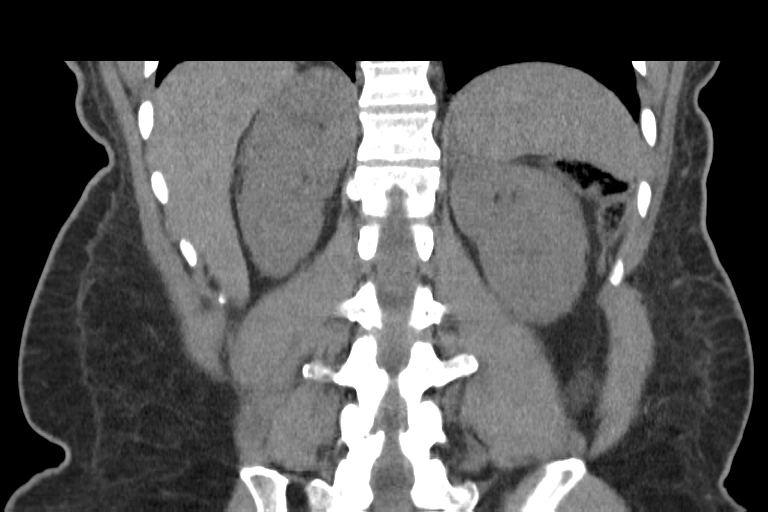

[Series 6: axial arterial · axial · arterial · 0.77mm/px · z∈[+1221,+1392]mm · 5 of 87 slices shown]
[im 15/87  soft-tissue]
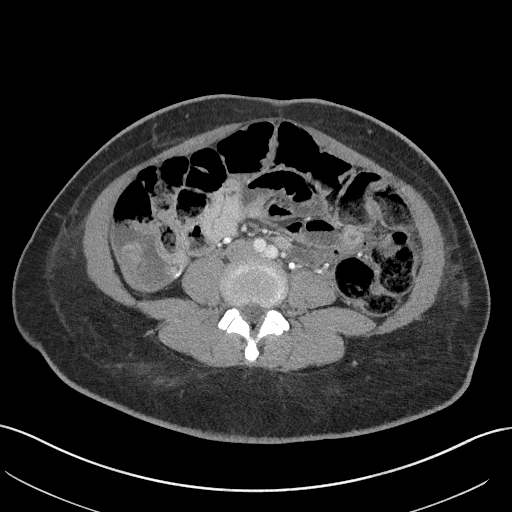
[im 29/87  soft-tissue]
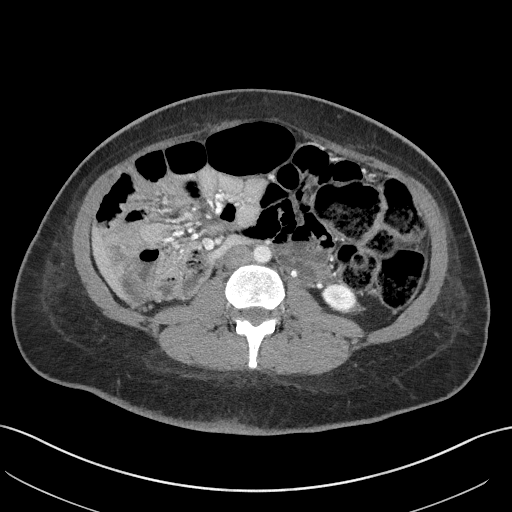
[im 44/87  soft-tissue]
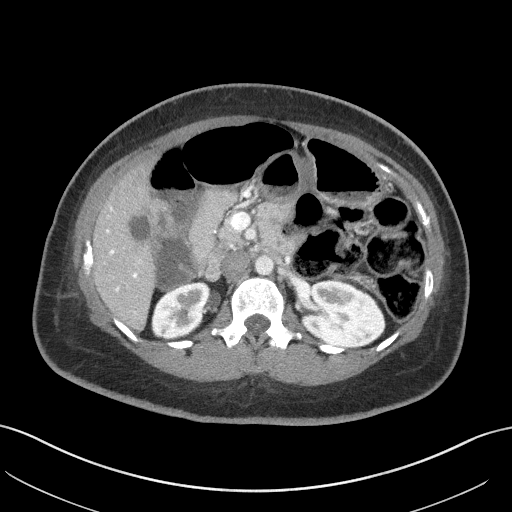
[im 58/87  soft-tissue]
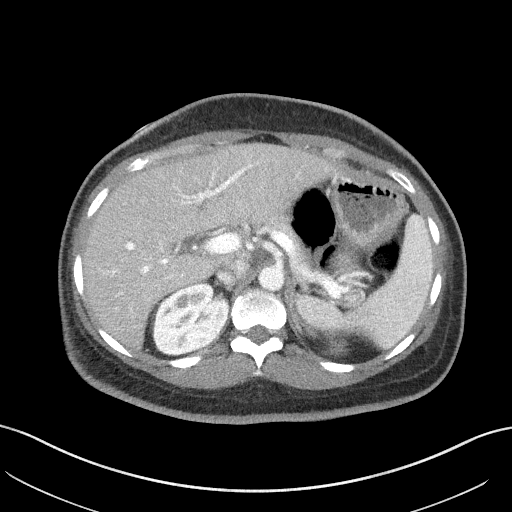
[im 72/87  soft-tissue]
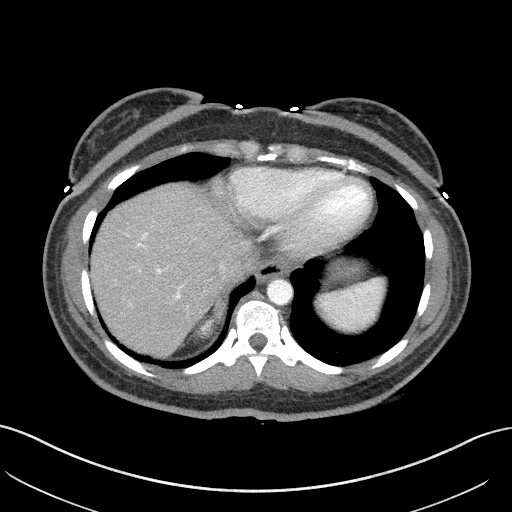

[12 of 46 positions shown; findings below may reference images not displayed]

RADIATION DOSE REDUCTION: This exam was performed according to the
departmental dose-optimization program which includes automated
exposure control, adjustment of the mA and/or kV according to
patient size and/or use of iterative reconstruction technique.

CONTRAST:  100mL OMNIPAQUE IOHEXOL 300 MG/ML  SOLN
FINDINGS: Lower chest: No acute findings.

Hepatobiliary: No hepatic masses identified. Gallbladder is
unremarkable. No evidence of biliary ductal dilatation.

Pancreas:  No mass or inflammatory changes.

Spleen:  Within normal limits in size and appearance.

Adrenals/Urinary Tract: No evidence of adrenal or renal masses. Tiny
calyceal diverticulum is incidentally noted in the upper pole of the
left kidney. No evidence of nephrolithiasis or hydronephrosis.

Stomach/Bowel: Unremarkable.

Vascular/Lymphatic: No pathologically enlarged lymph nodes
identified. No acute vascular findings.

Other:  None.

Musculoskeletal:  No suspicious bone lesions identified.
IMPRESSION: No evidence of renal mass, nephrolithiasis, or hydronephrosis.

## 2022-05-20 ENCOUNTER — Other Ambulatory Visit: Payer: Self-pay | Admitting: Gastroenterology

## 2022-05-20 DIAGNOSIS — R7989 Other specified abnormal findings of blood chemistry: Secondary | ICD-10-CM

## 2022-05-24 IMAGING — CT CT HEAD W/O CM
3 series · 16 of 47 positions shown, 19 images · non-contrast
Comparison: 04/30/2015

CLINICAL DATA: Cerebral hemorrhage suspected. Headache and
dizziness for 1 week. No known injury.

EXAM:
CT HEAD WITHOUT CONTRAST
TECHNIQUE: Contiguous axial images were obtained from the base of the skull
through the vertex without intravenous contrast.

[Series 2: head w o · axial · 0.44mm/px · z∈[-11,+119]mm · 10 of 32 slices shown, 13 images]
[im 3/32  brain]
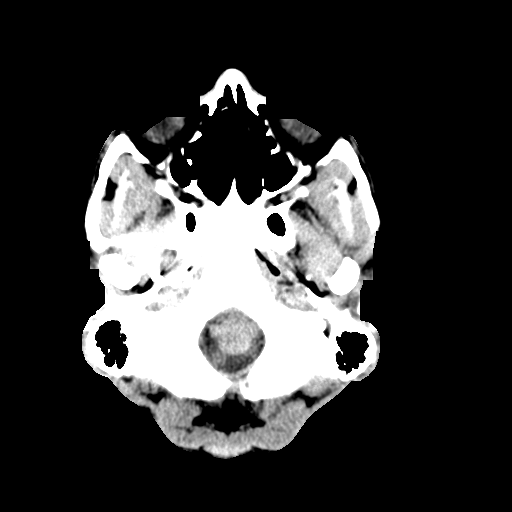
[im 3/32  bone]
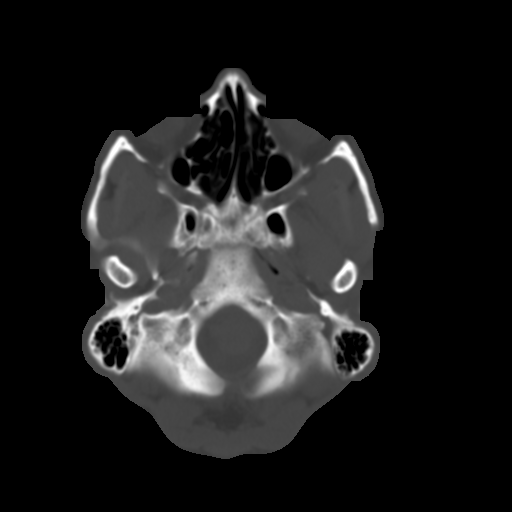
[im 6/32  brain]
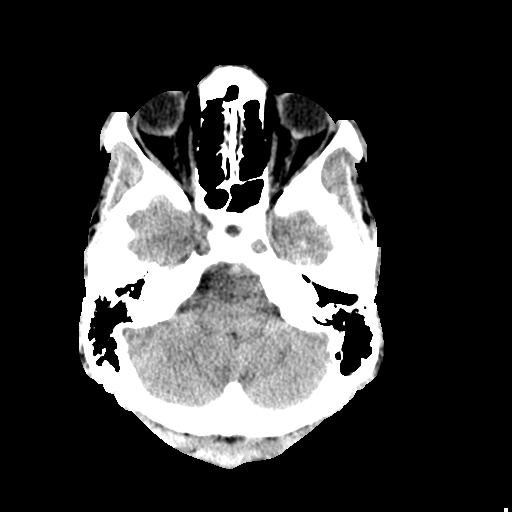
[im 9/32  brain]
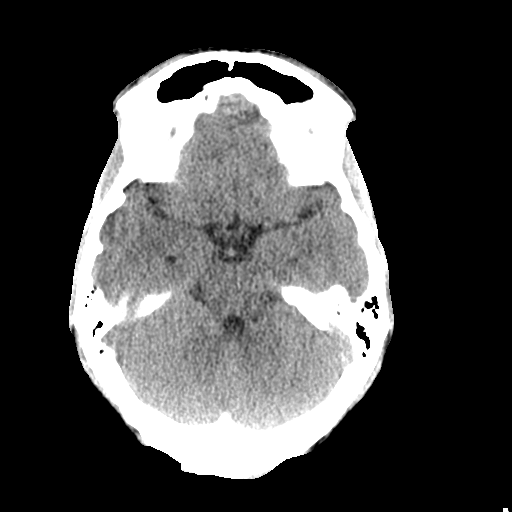
[im 11/32  brain]
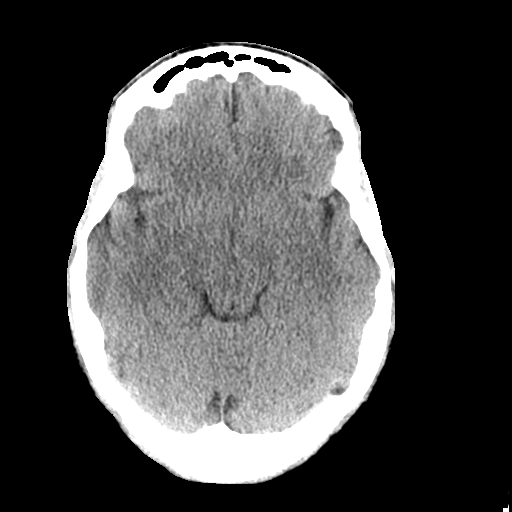
[im 14/32  brain]
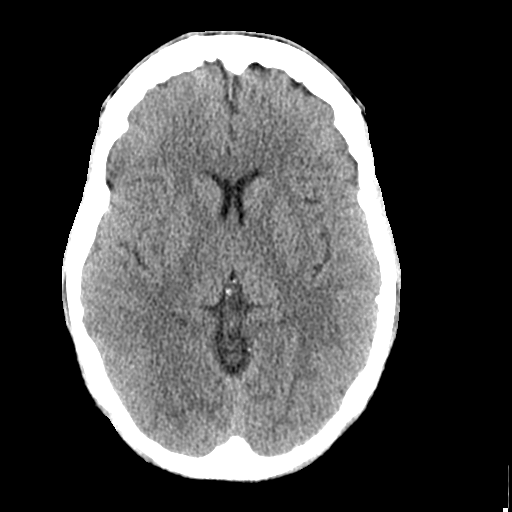
[im 14/32  bone]
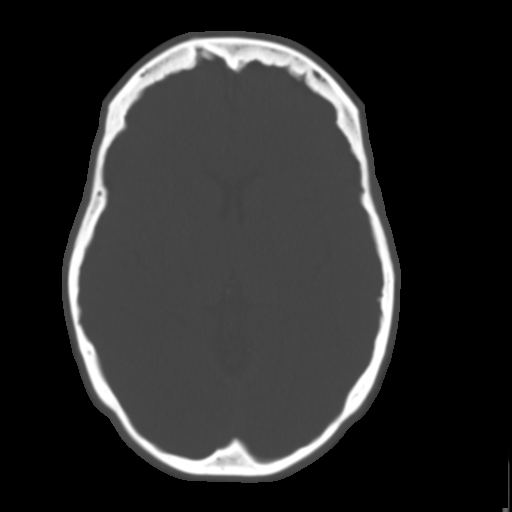
[im 18/32  brain]
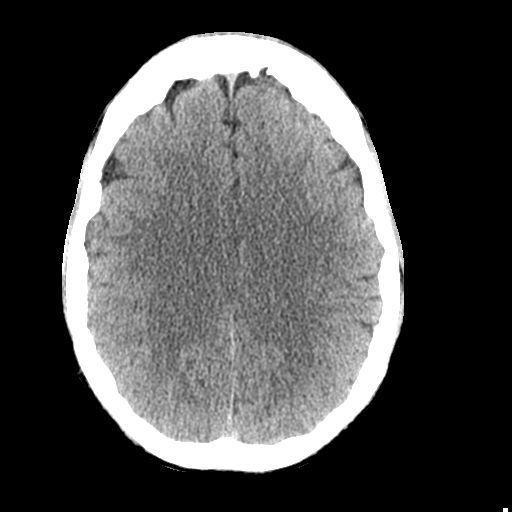
[im 21/32  brain]
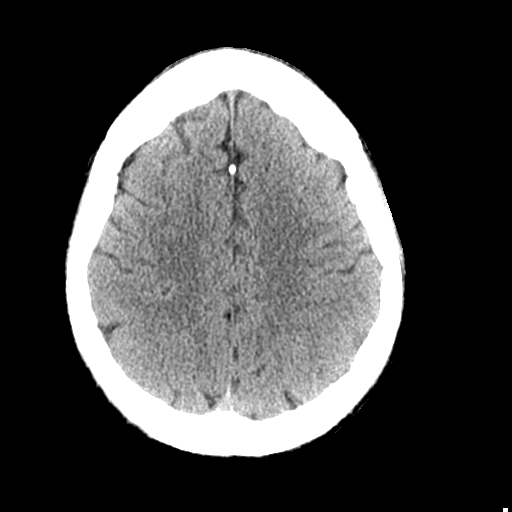
[im 24/32  brain]
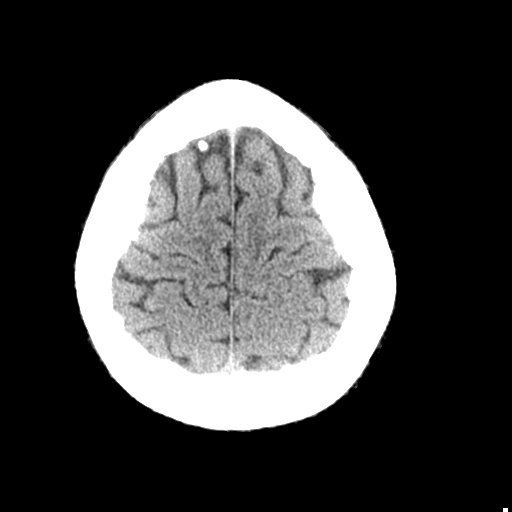
[im 26/32  brain]
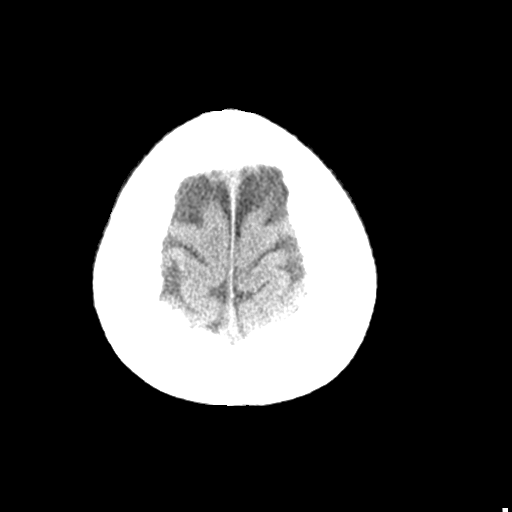
[im 26/32  bone]
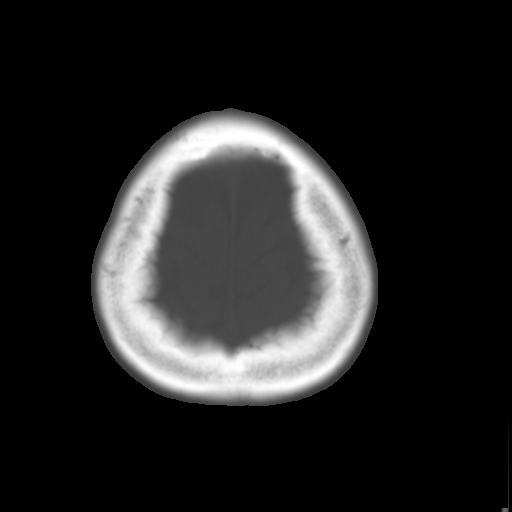
[im 29/32  brain]
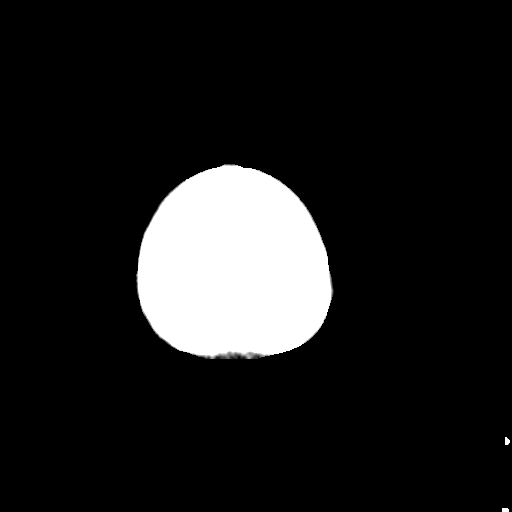

[Series 4: coronal soft · coronal · 0.31mm/px · 3 of 70 slices shown]
[im 24/70  brain]
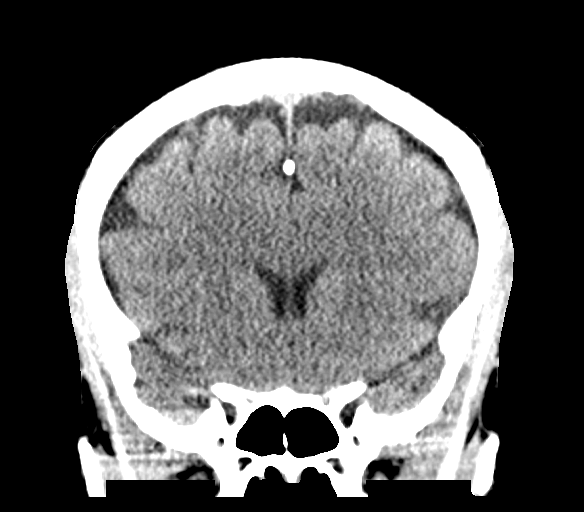
[im 31/70  brain]
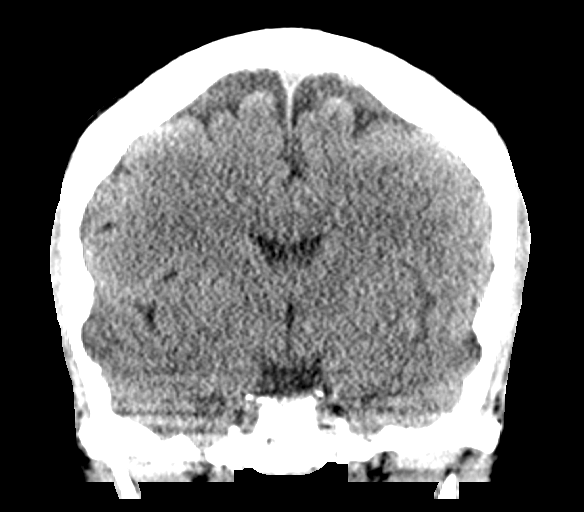
[im 39/70  brain]
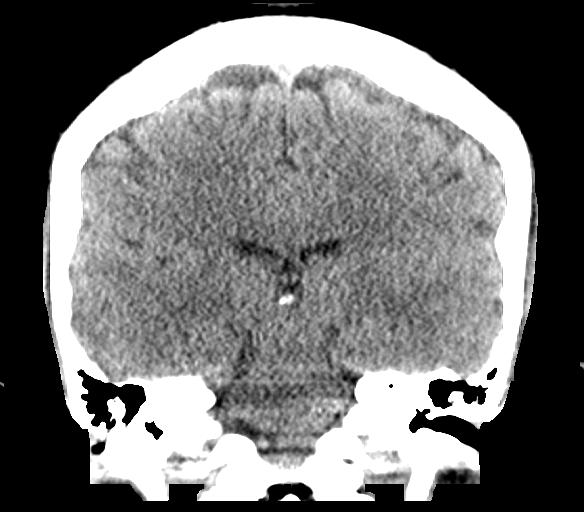

[Series 5: sagittal soft · sagittal · 0.35mm/px · 3 of 56 slices shown]
[im 19/56  brain]
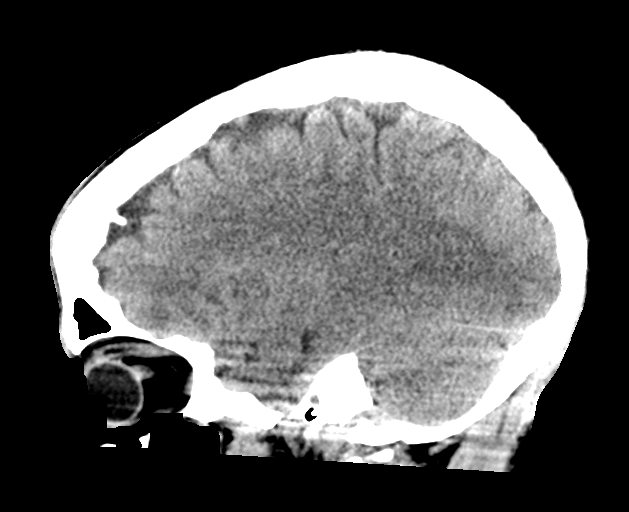
[im 28/56  brain]
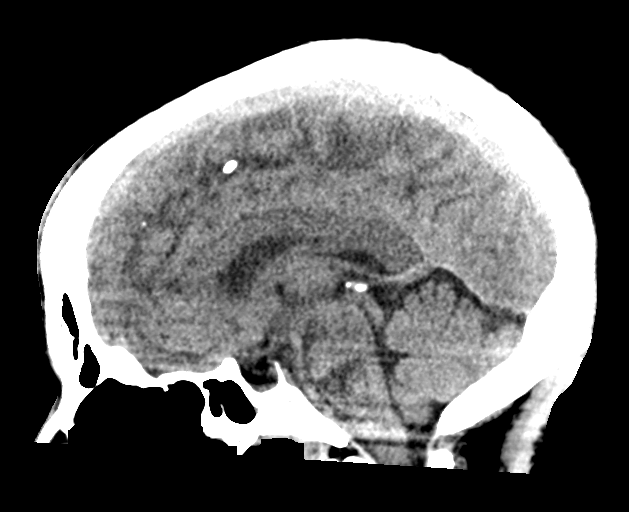
[im 37/56  brain]
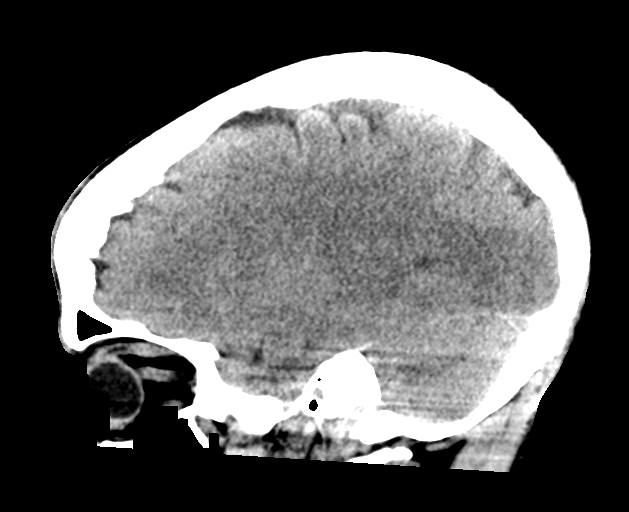

[16 of 47 positions shown; findings below may reference images not displayed]

FINDINGS: Brain: No evidence of acute infarction, hemorrhage, hydrocephalus,
extra-axial collection or mass lesion/mass effect.

Vascular: No hyperdense vessel or unexpected calcification.

Skull: Normal. Negative for fracture or focal lesion.

Sinuses/Orbits: No acute finding.

Other: None.
IMPRESSION: No evidence for acute intracranial abnormality.

## 2022-07-03 ENCOUNTER — Emergency Department (HOSPITAL_COMMUNITY)
Admission: EM | Admit: 2022-07-03 | Discharge: 2022-07-04 | Disposition: A | Discharge status: Home / Self Care | Payer: Medicaid Other | Attending: Emergency Medicine | Admitting: Emergency Medicine

## 2022-07-03 ENCOUNTER — Other Ambulatory Visit: Payer: Self-pay

## 2022-07-03 ENCOUNTER — Encounter (HOSPITAL_COMMUNITY): Payer: Self-pay

## 2022-07-03 DIAGNOSIS — Z794 Long term (current) use of insulin: Secondary | ICD-10-CM | POA: Insufficient documentation

## 2022-07-03 DIAGNOSIS — D649 Anemia, unspecified: Secondary | ICD-10-CM | POA: Diagnosis not present

## 2022-07-03 DIAGNOSIS — R1011 Right upper quadrant pain: Secondary | ICD-10-CM | POA: Diagnosis not present

## 2022-07-03 DIAGNOSIS — D696 Thrombocytopenia, unspecified: Secondary | ICD-10-CM | POA: Insufficient documentation

## 2022-07-03 DIAGNOSIS — R300 Dysuria: Secondary | ICD-10-CM | POA: Diagnosis not present

## 2022-07-03 DIAGNOSIS — Z79899 Other long term (current) drug therapy: Secondary | ICD-10-CM | POA: Insufficient documentation

## 2022-07-03 DIAGNOSIS — E1165 Type 2 diabetes mellitus with hyperglycemia: Secondary | ICD-10-CM | POA: Diagnosis not present

## 2022-07-03 DIAGNOSIS — R748 Abnormal levels of other serum enzymes: Secondary | ICD-10-CM

## 2022-07-03 DIAGNOSIS — R739 Hyperglycemia, unspecified: Secondary | ICD-10-CM | POA: Diagnosis not present

## 2022-07-03 DIAGNOSIS — E871 Hypo-osmolality and hyponatremia: Secondary | ICD-10-CM

## 2022-07-03 LAB — COMPREHENSIVE METABOLIC PANEL
ALT: 32 U/L (ref 0–44)
AST: 34 U/L (ref 15–41)
Albumin: 3.4 g/dL — ABNORMAL LOW (ref 3.5–5.0)
Alkaline Phosphatase: 213 U/L — ABNORMAL HIGH (ref 38–126)
Anion gap: 10 (ref 5–15)
BUN: 16 mg/dL (ref 6–20)
CO2: 28 mmol/L (ref 22–32)
Calcium: 9 mg/dL (ref 8.9–10.3)
Chloride: 91 mmol/L — ABNORMAL LOW (ref 98–111)
Creatinine, Ser: 0.85 mg/dL (ref 0.44–1.00)
GFR, Estimated: >60 mL/min (ref >60)
Glucose, Bld: 506 mg/dL (ref 70–99)
Potassium: 4.3 mmol/L (ref 3.5–5.1)
Sodium: 129 mmol/L — ABNORMAL LOW (ref 135–145)
Total Bilirubin: 0.7 mg/dL (ref 0.3–1.2)
Total Protein: 7.2 g/dL (ref 6.5–8.1)

## 2022-07-03 LAB — CBC
HCT: 32.8 % — ABNORMAL LOW (ref 36.0–46.0)
Hemoglobin: 10.6 g/dL — ABNORMAL LOW (ref 12.0–15.0)
MCH: 27 pg (ref 26.0–34.0)
MCHC: 32.3 g/dL (ref 30.0–36.0)
MCV: 83.5 fL (ref 80.0–100.0)
Platelets: 139 10*3/uL — ABNORMAL LOW (ref 150–400)
RBC: 3.93 MIL/uL (ref 3.87–5.11)
RDW: 13.2 % (ref 11.5–15.5)
WBC: 5.9 10*3/uL (ref 4.0–10.5)
nRBC: 0 % (ref 0.0–0.2)

## 2022-07-03 LAB — LIPASE, BLOOD: Lipase: 30 U/L (ref 11–51)

## 2022-07-03 MED ORDER — KETOROLAC TROMETHAMINE 15 MG/ML IJ SOLN
15.0000 mg | Freq: Once | INTRAMUSCULAR | Status: AC
  Administered 2022-07-03: 15 mg via INTRAVENOUS
  Filled 2022-07-03: qty 1

## 2022-07-03 MED ORDER — SODIUM CHLORIDE 0.9 % IV BOLUS
1000.0000 mL | Freq: Once | INTRAVENOUS | Status: AC
  Administered 2022-07-03: 1000 mL via INTRAVENOUS

## 2022-07-03 NOTE — ED Notes (Signed)
Patient given warm blanket per request. 

## 2022-07-03 NOTE — Discharge Instructions (Signed)
If you develop worsening, continued, or recurrent abdominal pain, uncontrolled vomiting, fever, chest or back pain, or any other new/concerning symptoms then return to the ER for evaluation.   IMPORTANT PATIENT INSTRUCTIONS:  Your ED provider has recommended an Outpatient Ultrasound.  Please call [phone] to schedule an appointment.  If your appointment is scheduled for a Saturday, Sunday or holiday, please go to the [facility] Registration Desk at least 15 minutes prior to your appointment time and tell them you are there for an ultrasound.    If your appointment is scheduled for a weekday (Monday-Friday), please go directly to the [facility] Radiology Department at least 15 minutes prior to your appointment time and tell them you are there for an ultrasound.  Please call [phone] with questions.

## 2022-07-03 NOTE — ED Triage Notes (Signed)
Pt reports right side abd pain for months, reports PCP has a liver biopsy scheduled for next month because she has liver problems.

## 2022-07-03 NOTE — ED Notes (Signed)
Patient made aware of need for urine sample, states she does not have to urinate at this time.

## 2022-07-03 NOTE — ED Provider Notes (Signed)
[facility] Provider Note   CSN: [id number] Arrival date & time: 07/03/22  1859     History  Chief Complaint  Patient presents with   Abdominal Pain    Anne Carney is a 47 y.o. female.  HPI 47 year old female presents with right-sided abdominal pain.  Patient states this pain has been there for months.  Seems to be progressively worsening.  Occasionally she will have vomiting but has not had vomiting in several days.  No fevers.  She will occasionally have dysuria but is not constant and seems to come and go more with how much she has to urinate.  She does have urinary frequency and recently stopped her metformin.  Also sound like she might not be taking her insulin as prescribed. Pain seems to radiate from right upper abdomen to epigastrum.  Home Medications Prior to Admission medications   Medication Sig Start Date End Date Taking? Authorizing Provider  blood glucose meter kit and supplies KIT Dispense based on patient and insurance preference. Use to check sugar up ro four times daily as directed. 10/24/20   Barton Dubois, MD  cephALEXin (KEFLEX) 500 MG capsule Take 1 capsule (500 mg total) by mouth 4 (four) times daily. 05/01/22   Schutt, Grafton Folk, PA-C  furosemide (LASIX) 20 MG tablet Take 20 mg by mouth daily as needed. 09/28/20   [provider]  gabapentin (NEURONTIN) 100 MG capsule Take 1 capsule (100 mg total) by mouth 2 (two) times daily. Patient taking differently: Take 200 mg by mouth 2 (two) times daily. Take 2 100 mg tablets twice daily 10/24/20   Barton Dubois, MD  ibuprofen (ADVIL) 800 MG tablet Take 1 tablet (800 mg total) by mouth every 8 (eight) hours as needed. 04/17/22   Leath-Warren, Alda Lea, NP  insulin glargine (LANTUS) 100 UNIT/ML injection Inject 0.38 mLs (38 Units total) into the skin 2 (two) times daily. 10/24/20   Barton Dubois, MD  lisinopril-hydrochlorothiazide (ZESTORETIC) 10-12.5 MG tablet  Take 1 tablet by mouth daily. 09/02/20   [provider]  meclizine (ANTIVERT) 25 MG tablet Take 1 tablet (25 mg total) by mouth every 8 (eight) hours as needed for dizziness. 10/24/20   Barton Dubois, MD  methocarbamol (ROBAXIN) 500 MG tablet Take 1 tablet (500 mg total) by mouth 2 (two) times daily. 05/01/22   Schutt, Grafton Folk, PA-C  ondansetron (ZOFRAN) 4 MG tablet Take 1 tablet (4 mg total) by mouth every 4 (four) hours as needed for nausea or vomiting. 07/29/21   Wynona Dove A, DO  pantoprazole (PROTONIX) 40 MG tablet Take 1 tablet (40 mg total) by mouth 2 (two) times daily before a meal. Patient taking differently: Take 40 mg by mouth daily as needed. 11/28/21 05/27/22  Mahala Menghini, PA-C      Allergies    Patient has no known allergies.    Review of Systems   Review of Systems  Constitutional:  Negative for fever.  Gastrointestinal:  Positive for abdominal pain and vomiting.  Genitourinary:  Negative for hematuria.    Physical Exam Updated Vital Signs BP (!) 160/82 (BP Location: Right Arm) Comment: has not taken BP meds in months  Pulse 90   Temp 98.1 F (36.7 C) (Oral)   Resp 16   Ht '5\' 5"'$  (1.651 m)   Wt 73 kg   LMP 06/25/2022   SpO2 99%   BMI 26.79 kg/m  Physical Exam Vitals and nursing note reviewed.  Constitutional:  General: She is not in acute distress.    Appearance: She is well-developed. She is not ill-appearing or diaphoretic.  HENT:     Head: Normocephalic and atraumatic.  Cardiovascular:     Rate and Rhythm: Normal rate and regular rhythm.     Heart sounds: Normal heart sounds.  Pulmonary:     Effort: Pulmonary effort is normal.     Breath sounds: Normal breath sounds.  Abdominal:     Palpations: Abdomen is soft.     Tenderness: There is abdominal tenderness (mild) in the right upper quadrant.  Skin:    General: Skin is warm and dry.  Neurological:     Mental Status: She is alert.     ED Results / Procedures / Treatments    Labs (all labs ordered are listed, but only abnormal results are displayed) Labs Reviewed  COMPREHENSIVE METABOLIC PANEL - Abnormal; Notable for the following components:      Result Value   Sodium 129 (*)    Chloride 91 (*)    Glucose, Bld 506 (*)    Albumin 3.4 (*)    Alkaline Phosphatase 213 (*)    All other components within normal limits  CBC - Abnormal; Notable for the following components:   Hemoglobin 10.6 (*)    HCT 32.8 (*)    Platelets 139 (*)    All other components within normal limits  LIPASE, BLOOD  URINALYSIS, ROUTINE W REFLEX MICROSCOPIC  POC URINE PREG, ED    EKG None  Radiology No results found.  Procedures Procedures    Medications Ordered in ED Medications  sodium chloride 0.9 % bolus 1,000 mL (1,000 mLs Intravenous New Bag/Given 07/03/22 2329)  ketorolac (TORADOL) 15 MG/ML injection 15 mg (15 mg Intravenous Given 07/03/22 2330)    ED Course/ Medical Decision Making/ A&P                             Medical Decision Making Amount and/or Complexity of Data Reviewed External Data Reviewed: notes. Labs: ordered.    Details: Hyperglycemia, no acidosis or AKI. Normal WBC Radiology: ordered.  Risk Prescription drug management.   Patient has what appears to be chronic right upper quadrant abdominal pain.  She had a CT 2 months ago which was unremarkable.  She has chronic alk phos elevation that is unchanged.  Other LFTs are okay.  Seems to be mildly progressively worsening but I do not think an emergent CT is warranted today.  We discussed positives and negatives of getting a repeat CT and decided to hold off for now.  Will get a right upper quadrant ultrasound which we will have to have her come back tomorrow to do.  She is hyperglycemic but no DKA.  She will be given some Toradol and some fluids.  Her urinalysis and pregnancy test are still pending. Care transferred to Dr. Roxanne Mins with these pending.        Final Clinical Impression(s) / ED  Diagnoses Final diagnoses:  None    Rx / DC Orders ED Discharge Orders          Ordered    US Abdomen Limited RUQ/Gall Gladder        07/03/22 2317              Sherwood Gambler, MD 07/04/22 0007

## 2022-07-03 NOTE — ED Notes (Signed)
Patient ambulatory to restroom with a steady gait.

## 2022-07-03 NOTE — ED Provider Notes (Incomplete)
[facility] Provider Note   CSN: [id number] Arrival date & time: 07/03/22  1859     History {Add pertinent medical, surgical, social history, OB history to HPI:1} Chief Complaint  Patient presents with  . Abdominal Pain    Anne Carney is a 47 y.o. female.  HPI 47 year old female presents with right-sided abdominal pain.  Patient states this pain has been there for months.  Seems to be progressively worsening.  Occasionally she will have vomiting but has not had vomiting in several days.  No fevers.  She will occasionally have dysuria but is not constant and seems to come and go more with how much she has to urinate.  She does have urinary frequency and recently stopped her metformin.  Also sound like she might not be taking her insulin as prescribed. Pain seems to radiate from right upper abdomen to epigastrum.  Home Medications Prior to Admission medications   Medication Sig Start Date End Date Taking? Authorizing Provider  blood glucose meter kit and supplies KIT Dispense based on patient and insurance preference. Use to check sugar up ro four times daily as directed. 10/24/20   Barton Dubois, MD  cephALEXin (KEFLEX) 500 MG capsule Take 1 capsule (500 mg total) by mouth 4 (four) times daily. 05/01/22   Schutt, Grafton Folk, PA-C  furosemide (LASIX) 20 MG tablet Take 20 mg by mouth daily as needed. 09/28/20   [provider]  gabapentin (NEURONTIN) 100 MG capsule Take 1 capsule (100 mg total) by mouth 2 (two) times daily. Patient taking differently: Take 200 mg by mouth 2 (two) times daily. Take 2 100 mg tablets twice daily 10/24/20   Barton Dubois, MD  ibuprofen (ADVIL) 800 MG tablet Take 1 tablet (800 mg total) by mouth every 8 (eight) hours as needed. 04/17/22   Leath-Warren, Alda Lea, NP  insulin glargine (LANTUS) 100 UNIT/ML injection Inject 0.38 mLs (38 Units total) into the skin 2 (two) times daily. 10/24/20   Barton Dubois, MD  lisinopril-hydrochlorothiazide (ZESTORETIC) 10-12.5 MG tablet Take 1 tablet by mouth daily. 09/02/20   [provider]  meclizine (ANTIVERT) 25 MG tablet Take 1 tablet (25 mg total) by mouth every 8 (eight) hours as needed for dizziness. 10/24/20   Barton Dubois, MD  methocarbamol (ROBAXIN) 500 MG tablet Take 1 tablet (500 mg total) by mouth 2 (two) times daily. 05/01/22   Schutt, Grafton Folk, PA-C  ondansetron (ZOFRAN) 4 MG tablet Take 1 tablet (4 mg total) by mouth every 4 (four) hours as needed for nausea or vomiting. 07/29/21   Wynona Dove A, DO  pantoprazole (PROTONIX) 40 MG tablet Take 1 tablet (40 mg total) by mouth 2 (two) times daily before a meal. Patient taking differently: Take 40 mg by mouth daily as needed. 11/28/21 05/27/22  Mahala Menghini, PA-C      Allergies    Patient has no known allergies.    Review of Systems   Review of Systems  Constitutional:  Negative for fever.  Gastrointestinal:  Positive for abdominal pain and vomiting.  Genitourinary:  Negative for hematuria.    Physical Exam Updated Vital Signs BP (!) 160/82 (BP Location: Right Arm) Comment: has not taken BP meds in months  Pulse 90   Temp 98.1 F (36.7 C) (Oral)   Resp 16   Ht '5\' 5"'$  (1.651 m)   Wt 73 kg   LMP 06/25/2022   SpO2 99%   BMI 26.79 kg/m  Physical  Exam Vitals and nursing note reviewed.  Constitutional:      General: She is not in acute distress.    Appearance: She is well-developed. She is not ill-appearing or diaphoretic.  HENT:     Head: Normocephalic and atraumatic.  Cardiovascular:     Rate and Rhythm: Normal rate and regular rhythm.     Heart sounds: Normal heart sounds.  Pulmonary:     Effort: Pulmonary effort is normal.     Breath sounds: Normal breath sounds.  Abdominal:     Palpations: Abdomen is soft.     Tenderness: There is abdominal tenderness (mild) in the right upper quadrant.  Skin:    General: Skin is warm and dry.  Neurological:      Mental Status: She is alert.     ED Results / Procedures / Treatments   Labs (all labs ordered are listed, but only abnormal results are displayed) Labs Reviewed  COMPREHENSIVE METABOLIC PANEL - Abnormal; Notable for the following components:      Result Value   Sodium 129 (*)    Chloride 91 (*)    Glucose, Bld 506 (*)    Albumin 3.4 (*)    Alkaline Phosphatase 213 (*)    All other components within normal limits  CBC - Abnormal; Notable for the following components:   Hemoglobin 10.6 (*)    HCT 32.8 (*)    Platelets 139 (*)    All other components within normal limits  LIPASE, BLOOD  URINALYSIS, ROUTINE W REFLEX MICROSCOPIC  POC URINE PREG, ED    EKG None  Radiology No results found.  Procedures Procedures  {Document cardiac monitor, telemetry assessment procedure when appropriate:1}  Medications Ordered in ED Medications  sodium chloride 0.9 % bolus 1,000 mL (1,000 mLs Intravenous New Bag/Given 07/03/22 2329)  ketorolac (TORADOL) 15 MG/ML injection 15 mg (15 mg Intravenous Given 07/03/22 2330)    ED Course/ Medical Decision Making/ A&P   {   Click here for ABCD2, HEART and other calculatorsREFRESH Note before signing :1}                          Medical Decision Making Amount and/or Complexity of Data Reviewed External Data Reviewed: notes. Labs: ordered.    Details: Hyperglycemia, no acidosis or AKI. Normal WBC Radiology: ordered.  Risk Prescription drug management.   Patient has what appears to be chronic right upper quadrant abdominal pain.  She had a CT 2 months ago which was unremarkable.  She has chronic alk phos elevation that is unchanged.  Other LFTs are okay.  Seems to be mildly progressively worsening but I do not think an emergent CT is warranted today.  We discussed positives and negatives of getting a repeat CT and decided to hold off for now.  Will get a right upper quadrant ultrasound which we will have to have her come back tomorrow to do.   She is hyperglycemic but no DKA.  She will be given some Toradol and some fluids.  Her urinalysis and pregnancy test are still pending. Care transferred to Dr. Roxanne Mins with these pending.  {Document critical care time when appropriate:1} {Document review of labs and clinical decision tools ie heart score, Chads2Vasc2 etc:1}  {Document your independent review of radiology images, and any outside records:1} {Document your discussion with family members, caretakers, and with consultants:1} {Document social determinants of health affecting pt's care:1} {Document your decision making why or why not admission, treatments were  needed:1} Final Clinical Impression(s) / ED Diagnoses Final diagnoses:  None    Rx / DC Orders ED Discharge Orders          Ordered    US Abdomen Limited RUQ/Gall Gladder        07/03/22 2317

## 2022-07-04 ENCOUNTER — Encounter (HOSPITAL_COMMUNITY): Payer: Self-pay | Admitting: Emergency Medicine

## 2022-07-04 ENCOUNTER — Ambulatory Visit (HOSPITAL_COMMUNITY)
Admission: RE | Admit: 2022-07-04 | Discharge: 2022-07-04 | Disposition: A | Discharge status: Home / Self Care | Payer: Medicaid Other | Source: Ambulatory Visit | Attending: Emergency Medicine | Admitting: Emergency Medicine

## 2022-07-04 DIAGNOSIS — R1011 Right upper quadrant pain: Secondary | ICD-10-CM

## 2022-07-04 LAB — URINALYSIS, ROUTINE W REFLEX MICROSCOPIC
Bilirubin Urine: NEGATIVE
Glucose, UA: >=500 mg/dL — AB
Hgb urine dipstick: NEGATIVE
Ketones, ur: 5 mg/dL — AB
Leukocytes,Ua: NEGATIVE
Nitrite: NEGATIVE
Protein, ur: 30 mg/dL — AB
Specific Gravity, Urine: 1.013 (ref 1.005–1.030)
pH: 8 (ref 5.0–8.0)

## 2022-07-04 LAB — CBG MONITORING, ED: Glucose-Capillary: 252 mg/dL — ABNORMAL HIGH (ref 70–99)

## 2022-07-04 LAB — POC URINE PREG, ED: Preg Test, Ur: NEGATIVE

## 2022-07-04 MED ORDER — INSULIN ASPART 100 UNIT/ML IV SOLN
10.0000 [IU] | Freq: Once | INTRAVENOUS | Status: AC
  Administered 2022-07-04: 10 [IU] via INTRAVENOUS

## 2022-07-04 NOTE — ED Notes (Signed)
Patient verbalizes understanding of discharge instructions. Opportunity for questioning and answers were provided. Armband removed by staff, pt discharged from ED. Ambulated out to lobby  

## 2022-07-04 NOTE — ED Provider Notes (Signed)
Care assumed from Dr. Regenia Skeeter, patient with abdominal pain and plan for outpatient right upper quadrant ultrasound in the morning.  Currently, urinalysis and urine pregnancy test are pending.  Anticipate discharge with instructions to return for ultrasound in the morning.  Glucose is come down to 252, patient is now felt to be safe for discharge.   Delora Fuel, MD 27/06/23 Rogene Houston

## 2022-07-04 NOTE — ED Provider Notes (Signed)
Patient called back to examination room and given ultrasound results. Verbalizes understanding. No other complaints.  US ABDOMEN LIMITED RUQ (LIVER/GB)  Result Date: 07/04/2022 CLINICAL DATA:  47 year old female with intermittent right upper quadrant pain for 2-3 months. EXAM: ULTRASOUND ABDOMEN LIMITED RIGHT UPPER QUADRANT COMPARISON:  CT Abdomen and Pelvis 05/01/2022 and earlier. FINDINGS: Gallbladder: Adjacent bowel gas. No gallstones, sludge, or wall thickening. No sonographic Murphy sign noted by sonographer. Common bile duct: Diameter: 4 mm, normal. Liver: No focal lesion identified. Within normal limits in parenchymal echogenicity. Portal vein is patent on color Doppler imaging with normal direction of blood flow towards the liver. Other: Negative visible right kidney.  No free fluid. IMPRESSION: Normal right upper quadrant ultrasound. Electronically Signed   By: Genevie Ann M.D.   On: 07/04/2022 09:45     Kristof Nadeem, Kathleen Argue 07/04/22 1100    Carmin Muskrat, MD 07/04/22 1536

## 2022-07-12 DIAGNOSIS — Z0120 Encounter for dental examination and cleaning without abnormal findings: Secondary | ICD-10-CM | POA: Diagnosis not present

## 2022-07-24 NOTE — Progress Notes (Unsigned)
GI Office Note    Referring Provider: Jerel Shepherd, FNP Primary Care Physician:  Jerel Shepherd, FNP  Primary Gastroenterologist:  Chief Complaint   No chief complaint on file.   History of Present Illness   Anne Carney is a 47 y.o. female presenting today for follow up. Last seen in office in 01/2022. H/o GERD, LUQ pain, early satiety/nausea.     Labs for elevated LFTs March 2023, LFTs normal except for alk phos of 251.  Hep B and C screen negative.  AMA less than 20.  Platelets slightly low, 142,000.  No iron overload or trend towards iron deficiency.  A1c was 11.5. Labs from May 2023: GGT 163.   Abdominal ultrasound April 2023: Liver unremarkable, 3.7 cm left renal upper pole mass noted.   CT abdomen with and without contrast April 2023 for renal mass: Liver unremarkable.  No renal mass seen.  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     MRI Abd/MRCP 03/2022: IMPRESSION: Motion degraded examination.  Within this context:   1. No acute abnormality identified in the abdomen. 2. Moderate volume of formed stool throughout the colon. 3. Uterine leiomyomas.  RUQ U/S 06/2022: normal  Recommended LFTs, PBC profile and if not helpful then pursue liver biopsy***    Colonoscopy 04/2022: -two 3-91m polyps in descending colon, stool in entire colon -repeat colonoscopy in 5 years for surveillance and borderline prep -sessile serrated adenoma.    EGD April 2023: - EZ-line regular, 35 cm from the incisors. - Erythematous mucosa in the antrum. Biopsied. - Normal duodenal bulb, first portion of the duodenum and second portion of the duodenum. -Gastric biopsy with mild nonspecific reactive gastropathy, no H. Pylori   Colonoscopy April 2023: - Preparation of the colon was inadequate. - Stool in the entire examined colon. - No specimens collected. -Attempt colonoscopy in 3 to 6 months.  Medications   Current  Outpatient Medications  Medication Sig Dispense Refill   blood glucose meter kit and supplies KIT Dispense based on patient and insurance preference. Use to check sugar up ro four times daily as directed. 1 each 0   cephALEXin (KEFLEX) 500 MG capsule Take 1 capsule (500 mg total) by mouth 4 (four) times daily. 20 capsule 0   furosemide (LASIX) 20 MG tablet Take 20 mg by mouth daily as needed.     gabapentin (NEURONTIN) 100 MG capsule Take 1 capsule (100 mg total) by mouth 2 (two) times daily. (Patient taking differently: Take 200 mg by mouth 2 (two) times daily. Take 2 100 mg tablets twice daily)     ibuprofen (ADVIL) 800 MG tablet Take 1 tablet (800 mg total) by mouth every 8 (eight) hours as needed. 30 tablet 0   insulin glargine (LANTUS) 100 UNIT/ML injection Inject 0.38 mLs (38 Units total) into the skin 2 (two) times daily. 10 mL 11   lisinopril-hydrochlorothiazide (ZESTORETIC) 10-12.5 MG tablet Take 1 tablet by mouth daily.     meclizine (ANTIVERT) 25 MG tablet Take 1 tablet (25 mg total) by mouth every 8 (eight) hours as needed for dizziness. 30 tablet 0   methocarbamol (ROBAXIN) 500 MG tablet Take 1 tablet (500 mg total) by mouth 2 (two) times daily. 20 tablet 0   ondansetron (ZOFRAN) 4 MG tablet Take 1 tablet (4 mg total) by mouth every 4 (four) hours as needed for nausea or vomiting. 5 tablet 0   pantoprazole (PROTONIX) 40 MG tablet  Take 1 tablet (40 mg total) by mouth 2 (two) times daily before a meal. (Patient taking differently: Take 40 mg by mouth daily as needed.) 60 tablet 5   No current facility-administered medications for this visit.    Allergies   Allergies as of 07/25/2022   (No Known Allergies)     Past Medical History   Past Medical History:  Diagnosis Date   Diabetes mellitus    Hypertension    Kidney disease    Neuropathy     Past Surgical History   Past Surgical History:  Procedure Laterality Date   BIOPSY  09/25/2021   Procedure: BIOPSY;  Surgeon:  Eloise Harman, DO;  [facility];  Service: Endoscopy;;   CESAREAN SECTION     COLONOSCOPY WITH PROPOFOL N/A 09/25/2021   Procedure: COLONOSCOPY WITH PROPOFOL;  Surgeon: Eloise Harman, DO;  [facility];  Service: Endoscopy;  Laterality: N/A;  9:30am, ASA 3   COLONOSCOPY WITH PROPOFOL N/A 04/24/2022   Procedure: COLONOSCOPY WITH PROPOFOL;  Surgeon: Eloise Harman, DO;  [facility];  Service: Endoscopy;  Laterality: N/A;  12:00pm, asa 2   ESOPHAGOGASTRODUODENOSCOPY (EGD) WITH PROPOFOL N/A 09/25/2021   Procedure: ESOPHAGOGASTRODUODENOSCOPY (EGD) WITH PROPOFOL;  Surgeon: Eloise Harman, DO;  [facility];  Service: Endoscopy;  Laterality: N/A;   POLYPECTOMY  04/24/2022   Procedure: POLYPECTOMY INTESTINAL;  Surgeon: Eloise Harman, DO;  [facility];  Service: Endoscopy;;   TOE SURGERY Right    TOE SURGERY Left     Past Family History   Family History  Problem Relation Age of Onset   Huntington's disease Father    Asthma Mother    Diabetes Daughter     Past Social History   Social History   Socioeconomic History   Marital status: Divorced    Spouse name: Not on file   Number of children: Not on file   Years of education: Not on file   Highest education level: Not on file  Occupational History   Not on file  Tobacco Use   Smoking status: Never   Smokeless tobacco: Never  Substance and Sexual Activity   Alcohol use: Yes    Comment: occ.   Drug use: Not Currently    Types: Marijuana    Comment: occasionally   Sexual activity: Yes    Birth control/protection: None  Other Topics Concern   Not on file  Social History Narrative   Not on file   Social Determinants of Health   Financial Resource Strain: Not on file  Food Insecurity: Not on file  Transportation Needs: Not on file  Physical Activity: Not on file  Stress: Not on file  Social Connections: Not on file  Intimate Partner Violence: Not on  file    Review of Systems   General: Negative for anorexia, weight loss, fever, chills, fatigue, weakness. ENT: Negative for hoarseness, difficulty swallowing , nasal congestion. CV: Negative for chest pain, angina, palpitations, dyspnea on exertion, peripheral edema.  Respiratory: Negative for dyspnea at rest, dyspnea on exertion, cough, sputum, wheezing.  GI: See history of present illness. GU:  Negative for dysuria, hematuria, urinary incontinence, urinary frequency, nocturnal urination.  Endo: Negative for unusual weight change.     Physical Exam   LMP 06/25/2022    General: Well-nourished, well-developed in no acute distress.  Eyes: No icterus. Mouth: Oropharyngeal mucosa moist and pink , no lesions erythema or exudate. Lungs: Clear to auscultation  bilaterally.  Heart: Regular rate and rhythm, no murmurs rubs or gallops.  Abdomen: Bowel sounds are normal, nontender, nondistended, no hepatosplenomegaly or masses,  no abdominal bruits or hernia , no rebound or guarding.  Rectal: ***  Extremities: No lower extremity edema. No clubbing or deformities. Neuro: Alert and oriented x 4   Skin: Warm and dry, no jaundice.   Psych: Alert and cooperative, normal mood and affect.  Labs   *** Imaging Studies   US ABDOMEN LIMITED RUQ (LIVER/GB)  Result Date: 07/04/2022 CLINICAL DATA:  47 year old female with intermittent right upper quadrant pain for 2-3 months. EXAM: ULTRASOUND ABDOMEN LIMITED RIGHT UPPER QUADRANT COMPARISON:  CT Abdomen and Pelvis 05/01/2022 and earlier. FINDINGS: Gallbladder: Adjacent bowel gas. No gallstones, sludge, or wall thickening. No sonographic Murphy sign noted by sonographer. Common bile duct: Diameter: 4 mm, normal. Liver: No focal lesion identified. Within normal limits in parenchymal echogenicity. Portal vein is patent on color Doppler imaging with normal direction of blood flow towards the liver. Other: Negative visible right kidney.  No free fluid.  IMPRESSION: Normal right upper quadrant ultrasound. Electronically Signed   By: Genevie Ann M.D.   On: 07/04/2022 09:45    Assessment       PLAN   ***   Laureen Ochs. Bobby Rumpf, [place], [facility]

## 2022-07-25 ENCOUNTER — Encounter: Payer: Self-pay | Admitting: Gastroenterology

## 2022-07-25 ENCOUNTER — Ambulatory Visit (INDEPENDENT_AMBULATORY_CARE_PROVIDER_SITE_OTHER): Payer: Medicaid Other | Admitting: Gastroenterology

## 2022-07-25 VITALS — BP 150/90 | HR 92 | Temp 97.8°F | Ht 66.0 in | Wt 163.8 lb

## 2022-07-25 DIAGNOSIS — R1011 Right upper quadrant pain: Secondary | ICD-10-CM | POA: Diagnosis not present

## 2022-07-25 DIAGNOSIS — K5904 Chronic idiopathic constipation: Secondary | ICD-10-CM

## 2022-07-25 DIAGNOSIS — I10 Essential (primary) hypertension: Secondary | ICD-10-CM | POA: Diagnosis not present

## 2022-07-25 DIAGNOSIS — R7989 Other specified abnormal findings of blood chemistry: Secondary | ICD-10-CM

## 2022-07-25 MED ORDER — TRULANCE 3 MG PO TABS: 3.0000 mg | ORAL_TABLET | Freq: Every day | ORAL | 5 refills | Status: DC

## 2022-07-25 NOTE — Patient Instructions (Addendum)
Take pantoprazole 91m daily before breakfast. Try Trulance 367mdaily as needed for constipation. [place] [facility] [place]  Please follow up with PCP for persistently elevated blood pressure over 140/90.

## 2022-07-26 ENCOUNTER — Telehealth: Payer: Self-pay

## 2022-07-26 MED ORDER — LINACLOTIDE 145 MCG PO CAPS: 145.0000 ug | ORAL_CAPSULE | Freq: Every day | ORAL | 3 refills | Status: DC

## 2022-07-26 NOTE — Telephone Encounter (Signed)
Trulance is not on patient's formulary. Pt will need to try and fail linzess and amitiza.

## 2022-07-26 NOTE — Telephone Encounter (Signed)
RX for Linzess sent to pharmacy.

## 2022-07-26 NOTE — Addendum Note (Signed)
Addended by: Mahala Menghini on: 07/26/2022 11:03 AM   Modules accepted: Orders

## 2022-08-28 ENCOUNTER — Ambulatory Visit (INDEPENDENT_AMBULATORY_CARE_PROVIDER_SITE_OTHER): Payer: Medicaid Other | Admitting: Podiatry

## 2022-08-28 ENCOUNTER — Ambulatory Visit (INDEPENDENT_AMBULATORY_CARE_PROVIDER_SITE_OTHER): Payer: Medicaid Other

## 2022-08-28 DIAGNOSIS — M2041 Other hammer toe(s) (acquired), right foot: Secondary | ICD-10-CM | POA: Diagnosis not present

## 2022-08-28 DIAGNOSIS — M2042 Other hammer toe(s) (acquired), left foot: Secondary | ICD-10-CM | POA: Diagnosis not present

## 2022-08-28 NOTE — Patient Instructions (Signed)
More silicone pads can be purchased from:  [email or web address]    Look for urea 40% cream or ointment and apply to the thickened dry skin / calluses. This can be bought over the counter, at a pharmacy or online such as [employer].  

## 2022-09-02 NOTE — Progress Notes (Signed)
  Subjective:  Patient ID: Anne Carney, female    DOB: Jan 03, 1976,  MRN: [id number]  Chief Complaint  Patient presents with   Hammer Toe    Est- Hammertoe Right - wants to talk about the left foot as well - new xrays of both feet today     47 y.o. female returns for post-op check.  She returns for follow-up on both feet, she has pain on a callus on the right second toe  Review of Systems: Negative except as noted in the HPI. Denies N/V/F/Ch.   Objective:  There were no vitals filed for this visit. There is no height or weight on file to calculate BMI. Constitutional Well developed. Well nourished.  Vascular Foot warm and well perfused. Capillary refill normal to all digits.  Calf is soft and supple, no posterior calf or knee pain, negative Homans' sign  Neurologic Normal speech. Oriented to person, place, and time. Epicritic sensation to light touch grossly reduced bilaterally.  Dermatologic Hyperkeratosis tip of second toe partial amputation site  Orthopedic: Tenderness on the second toe on the right foot, remaining toes feel "sensitive" but does not have digital hammertoe pain or callus formation around the sites   Multiple view plain film radiographs: New films taken today show complete consolidation of the arthrodesis site on the second toe right, third toe does not show consolidation, digital contractures on the other toes. Assessment:   1. Hammertoe of left foot   2. Hammertoe of right foot    Plan:  Patient was evaluated and treated and all questions answered.  S/p foot surgery right -We reviewed her radiographs today.  Considering her complicated history and course following surgery I am not sure that further surgical correction would be warranted at this point, she does not seem to have classical hammertoe type pain.  I debrided the callus from the previous pin site and recommended utilizing offloading silicone pads and urea cream.  I will see her back as needed if  it continues to worsen.  Return if symptoms worsen or fail to improve.

## 2022-10-04 ENCOUNTER — Ambulatory Visit: Payer: Medicaid Other | Admitting: Family Medicine

## 2022-10-04 ENCOUNTER — Encounter: Payer: Self-pay | Admitting: Family Medicine

## 2022-10-04 VITALS — BP 158/84 | HR 95 | Temp 98.3°F | Ht 66.0 in | Wt 160.0 lb

## 2022-10-04 DIAGNOSIS — I10 Essential (primary) hypertension: Secondary | ICD-10-CM

## 2022-10-04 DIAGNOSIS — E1142 Type 2 diabetes mellitus with diabetic polyneuropathy: Secondary | ICD-10-CM

## 2022-10-04 DIAGNOSIS — R1011 Right upper quadrant pain: Secondary | ICD-10-CM | POA: Diagnosis not present

## 2022-10-04 DIAGNOSIS — R809 Proteinuria, unspecified: Secondary | ICD-10-CM

## 2022-10-04 DIAGNOSIS — E785 Hyperlipidemia, unspecified: Secondary | ICD-10-CM | POA: Diagnosis not present

## 2022-10-04 DIAGNOSIS — D649 Anemia, unspecified: Secondary | ICD-10-CM

## 2022-10-04 DIAGNOSIS — R7989 Other specified abnormal findings of blood chemistry: Secondary | ICD-10-CM | POA: Diagnosis not present

## 2022-10-04 DIAGNOSIS — K5904 Chronic idiopathic constipation: Secondary | ICD-10-CM | POA: Diagnosis not present

## 2022-10-04 NOTE — Patient Instructions (Addendum)
Labs today.  We will call with the results and go from there.   Please be complaint with your medication.  Follow up in 2 weeks.

## 2022-10-05 DIAGNOSIS — E785 Hyperlipidemia, unspecified: Secondary | ICD-10-CM | POA: Insufficient documentation

## 2022-10-05 DIAGNOSIS — R809 Proteinuria, unspecified: Secondary | ICD-10-CM | POA: Insufficient documentation

## 2022-10-05 LAB — IRON,TIBC AND FERRITIN PANEL
%SAT: 25 % (calc) (ref 16–45)
Ferritin: 13 ng/mL — ABNORMAL LOW (ref 16–232)
Iron: 106 ug/dL (ref 40–190)
TIBC: 432 mcg/dL (calc) (ref 250–450)

## 2022-10-05 LAB — COMPREHENSIVE METABOLIC PANEL
AG Ratio: 1.4 (calc) (ref 1.0–2.5)
ALT: 38 U/L — ABNORMAL HIGH (ref 6–29)
AST: 50 U/L — ABNORMAL HIGH (ref 10–35)
Albumin: 3.8 g/dL (ref 3.6–5.1)
Alkaline phosphatase (APISO): 382 U/L — ABNORMAL HIGH (ref 31–125)
BUN: 18 mg/dL (ref 7–25)
CO2: 32 mmol/L (ref 20–32)
Calcium: 9.3 mg/dL (ref 8.6–10.2)
Chloride: 97 mmol/L — ABNORMAL LOW (ref 98–110)
Creat: 0.82 mg/dL (ref 0.50–0.99)
Globulin: 2.8 g/dL (calc) (ref 1.9–3.7)
Glucose, Bld: 252 mg/dL — ABNORMAL HIGH (ref 65–99)
Potassium: 4.1 mmol/L (ref 3.5–5.3)
Sodium: 137 mmol/L (ref 135–146)
Total Bilirubin: 0.6 mg/dL (ref 0.2–1.2)
Total Protein: 6.6 g/dL (ref 6.1–8.1)

## 2022-10-05 LAB — MICROALBUMIN / CREATININE URINE RATIO
Creatinine, Urine: 73 mg/dL (ref 20–275)
Microalb Creat Ratio: 236 mg/g creat — ABNORMAL HIGH (ref <30)
Microalb, Ur: 17.2 mg/dL

## 2022-10-05 LAB — CBC
HCT: 37 % (ref 35.0–45.0)
Hemoglobin: 11.6 g/dL — ABNORMAL LOW (ref 11.7–15.5)
MCH: 27.1 pg (ref 27.0–33.0)
MCHC: 31.4 g/dL — ABNORMAL LOW (ref 32.0–36.0)
MCV: 86.4 fL (ref 80.0–100.0)
MPV: 11.5 fL (ref 7.5–12.5)
Platelets: 171 10*3/uL (ref 140–400)
RBC: 4.28 10*6/uL (ref 3.80–5.10)
RDW: 13.7 % (ref 11.0–15.0)
WBC: 6.8 10*3/uL (ref 3.8–10.8)

## 2022-10-05 LAB — FOLATE: Folate: 10.5 ng/mL

## 2022-10-05 LAB — LIPID PANEL
Cholesterol: 210 mg/dL — ABNORMAL HIGH (ref <200)
HDL: 88 mg/dL (ref >=50)
LDL Cholesterol (Calc): 108 mg/dL (calc) — ABNORMAL HIGH
Non-HDL Cholesterol (Calc): 122 mg/dL (calc) (ref <130)
Total CHOL/HDL Ratio: 2.4 (calc) (ref <5.0)
Triglycerides: 49 mg/dL (ref <150)

## 2022-10-05 LAB — HEMOGLOBIN A1C
Hgb A1c MFr Bld: 13.1 % of total Hgb — ABNORMAL HIGH (ref <5.7)
Mean Plasma Glucose: 329 mg/dL
eAG (mmol/L): 18.2 mmol/L

## 2022-10-05 LAB — VITAMIN B12: Vitamin B-12: 703 pg/mL (ref 200–1100)

## 2022-10-05 MED ORDER — LISINOPRIL-HYDROCHLOROTHIAZIDE 10-12.5 MG PO TABS: 1.0000 | ORAL_TABLET | Freq: Every day | ORAL | 3 refills | Status: DC

## 2022-10-05 NOTE — Assessment & Plan Note (Signed)
Markedly uncontrolled.  Due to noncompliance.  A1c returned at 13.1. Referring to endocrinology.

## 2022-10-05 NOTE — Progress Notes (Signed)
Subjective:  Patient ID: Anne Carney, female    DOB: [birth year]  Age: 47 y.o. MRN: [id number]  CC: Chief Complaint  Patient presents with   Establish Care    Diabetes , HTN , will use [facility] for her labs    HPI:  47 year old female presents to establish care.  Patient has history of uncontrolled type 2 diabetes, uncontrolled hypertension, noncompliance, elevated LFTs and alkaline phosphatase followed by GI.  Patient's diabetes is uncontrolled.  She was in the ER in January and her blood sugar was over 500.  Patient states that she has recently been compliant with her insulin.  She is currently taking Levemir.  This is no longer being made by the manufacturer.  She is taking 38 units twice daily.  Patient endorsing decreased appetite and polyuria.  Patient's hypertension is uncontrolled.  She is currently on lisinopril/HCTZ and also on as needed Lasix.  She endorses missing days of her medication.  Patient is not sure exactly why she is not compliant.  Patient follows with GI regarding persistently elevated LFTs as well as alkaline phosphatase.  I believe that uncontrolled diabetes is playing a significant role.  Patient Active Problem List   Diagnosis Date Noted   Proteinuria 10/05/2022   Hyperlipidemia 10/05/2022   Elevated alkaline phosphatase level 01/26/2022   Elevated LFTs 11/28/2021   Constipation 08/29/2021   Neuropathy 01/15/2021   Gastroesophageal reflux disease    Primary hypertension    DM type 2 (diabetes mellitus, type 2) (HCC) 11/21/2015    Social Hx   Social History   Socioeconomic History   Marital status: Divorced    Spouse name: Not on file   Number of children: Not on file   Years of education: Not on file   Highest education level: Not on file  Occupational History   Not on file  Tobacco Use   Smoking status: Never   Smokeless tobacco: Never  Substance and Sexual Activity   Alcohol use: Yes    Comment: occ.   Drug use: Not Currently     Types: Marijuana    Comment: occasionally   Sexual activity: Yes    Birth control/protection: None  Other Topics Concern   Not on file  Social History Narrative   Not on file   Social Determinants of Health   Financial Resource Strain: Not on file  Food Insecurity: Not on file  Transportation Needs: Not on file  Physical Activity: Not on file  Stress: Not on file  Social Connections: Not on file    Review of Systems Per HPI  Objective:  BP (!) 158/84   Pulse 95   Temp 98.3 F (36.8 C)   Ht 5\' 6"  (1.676 m)   Wt 160 lb (72.6 kg)   SpO2 99%   BMI 25.82 kg/m      10/04/2022    2:44 PM 07/25/2022   12:17 PM 07/25/2022   11:34 AM  BP/Weight  Systolic BP 158 150 146  Diastolic BP 84 90 89  Wt. (Lbs) 160  163.8  BMI 25.82 kg/m2  26.44 kg/m2    Physical Exam Vitals and nursing note reviewed.  Constitutional:      Appearance: Normal appearance.  HENT:     Head: Normocephalic and atraumatic.  Eyes:     General:        Right eye: No discharge.        Left eye: No discharge.     Conjunctiva/sclera: Conjunctivae normal.  Cardiovascular:  Rate and Rhythm: Normal rate and regular rhythm.  Pulmonary:     Effort: Pulmonary effort is normal.     Breath sounds: Normal breath sounds. No wheezing or rales.  Neurological:     Mental Status: She is alert.     Lab Results  Component Value Date   WBC 6.8 10/04/2022   HGB 11.6 (L) 10/04/2022   HCT 37.0 10/04/2022   PLT 171 10/04/2022   GLUCOSE 252 (H) 10/04/2022   CHOL 210 (H) 10/04/2022   TRIG 49 10/04/2022   HDL 88 10/04/2022   LDLCALC 108 (H) 10/04/2022   ALT 38 (H) 10/04/2022   AST 50 (H) 10/04/2022   NA 137 10/04/2022   K 4.1 10/04/2022   CL 97 (L) 10/04/2022   CREATININE 0.82 10/04/2022   BUN 18 10/04/2022   CO2 32 10/04/2022   HGBA1C 13.1 (H) 10/04/2022   MICROALBUR 17.2 10/04/2022     Assessment & Plan:   Problem List Items Addressed This Visit       Cardiovascular and Mediastinum   Primary  hypertension    Creatinine normal.  Is having significant proteinuria.   Suspect noncompliance with blood pressure medication.  Restarting lisinopril/HCTZ.      Relevant Medications   lisinopril-hydrochlorothiazide (ZESTORETIC) 10-12.5 MG tablet     Endocrine   DM type 2 (diabetes mellitus, type 2) (HCC) - Primary    Markedly uncontrolled.  Due to noncompliance.  A1c returned at 13.1. Referring to endocrinology.      Relevant Medications   lisinopril-hydrochlorothiazide (ZESTORETIC) 10-12.5 MG tablet   Other Relevant Orders   Hemoglobin A1c (Completed)   Microalbumin / creatinine urine ratio (Completed)   Comprehensive metabolic panel (Completed)     Other   Hyperlipidemia    LDL elevated.  Needs statin therapy.      Relevant Medications   lisinopril-hydrochlorothiazide (ZESTORETIC) 10-12.5 MG tablet   Other Relevant Orders   Lipid panel (Completed)   Proteinuria    Placing patient back on ACE inhibitor.  Would benefit from addition of SGLT2.      Other Visit Diagnoses     Normocytic anemia       Relevant Orders   CBC (Completed)   Iron, TIBC and Ferritin Panel (Completed)   Vitamin B12 (Completed)   Folate (Completed)       Meds ordered this encounter  Medications   lisinopril-hydrochlorothiazide (ZESTORETIC) 10-12.5 MG tablet    Sig: Take 1 tablet by mouth daily.    Dispense:  90 tablet    Refill:  3    Follow-up:  Return in about 2 weeks (around 10/18/2022).  Everlene Other DO [facility]

## 2022-10-05 NOTE — Assessment & Plan Note (Signed)
Placing patient back on ACE inhibitor.  Would benefit from addition of SGLT2.

## 2022-10-05 NOTE — Assessment & Plan Note (Signed)
LDL elevated.  Needs statin therapy.

## 2022-10-05 NOTE — Assessment & Plan Note (Signed)
Creatinine normal.  Is having significant proteinuria.   Suspect noncompliance with blood pressure medication.  Restarting lisinopril/HCTZ.

## 2022-10-10 ENCOUNTER — Telehealth: Payer: Self-pay

## 2022-10-10 NOTE — Telephone Encounter (Signed)
Pt return call to [facility] regarding her labs   Anne Carney -[phone] Best to reach her in the afternoon at 3:00

## 2022-10-11 ENCOUNTER — Other Ambulatory Visit: Payer: Self-pay | Admitting: Family Medicine

## 2022-10-11 ENCOUNTER — Telehealth: Payer: Self-pay

## 2022-10-11 ENCOUNTER — Other Ambulatory Visit: Payer: Self-pay

## 2022-10-11 DIAGNOSIS — E1142 Type 2 diabetes mellitus with diabetic polyneuropathy: Secondary | ICD-10-CM

## 2022-10-11 LAB — PRIMARY BILIARY CHOLANGITIS (PBC) DIAGNOSTIC PANEL
Actin (Smooth Muscle) Antibody (IGG): <20 U (ref <20)
Anti Nuclear Antibody (ANA): NEGATIVE
LKM1 Ab: <=20 U (ref <=20.0)
Mitochondrial Antibody Screen: NEGATIVE
SSA (Ro) (ENA) Antibody, IgG: <1 AI
SSB (La) (ENA) Antibody, IgG: <1 AI
Thyroperoxidase Ab SerPl-aCnc: <1 IU/mL (ref <9)

## 2022-10-11 MED ORDER — IRON (FERROUS SULFATE) 325 (65 FE) MG PO TABS: 325.0000 mg | ORAL_TABLET | ORAL | 1 refills | Status: DC

## 2022-10-11 MED ORDER — ROSUVASTATIN CALCIUM 20 MG PO TABS: 20.0000 mg | ORAL_TABLET | Freq: Every day | ORAL | 3 refills | Status: DC

## 2022-10-11 NOTE — Telephone Encounter (Signed)
Message has been addressed.

## 2022-10-11 NOTE — Telephone Encounter (Signed)
Patient has been informed per drs lab results, verbalizes understanding. May send cholesterol medication to [facility] in [place].

## 2022-10-16 ENCOUNTER — Ambulatory Visit (INDEPENDENT_AMBULATORY_CARE_PROVIDER_SITE_OTHER): Payer: Medicaid Other

## 2022-10-16 DIAGNOSIS — Z111 Encounter for screening for respiratory tuberculosis: Secondary | ICD-10-CM

## 2022-10-16 MED ORDER — TUBERCULIN PPD 5 UNIT/0.1ML ID SOLN: 5.0000 [IU] | Freq: Once | INTRADERMAL | 0 refills | Status: DC

## 2022-10-16 NOTE — Progress Notes (Unsigned)
PPD skin test placed in left arm

## 2022-10-18 ENCOUNTER — Ambulatory Visit: Payer: Medicaid Other | Admitting: Family Medicine

## 2022-10-18 VITALS — BP 122/84 | Ht 66.0 in | Wt 160.0 lb

## 2022-10-18 DIAGNOSIS — R748 Abnormal levels of other serum enzymes: Secondary | ICD-10-CM | POA: Diagnosis not present

## 2022-10-18 DIAGNOSIS — R809 Proteinuria, unspecified: Secondary | ICD-10-CM

## 2022-10-18 DIAGNOSIS — E1129 Type 2 diabetes mellitus with other diabetic kidney complication: Secondary | ICD-10-CM

## 2022-10-18 DIAGNOSIS — Z794 Long term (current) use of insulin: Secondary | ICD-10-CM | POA: Diagnosis not present

## 2022-10-18 DIAGNOSIS — I10 Essential (primary) hypertension: Secondary | ICD-10-CM | POA: Diagnosis not present

## 2022-10-18 LAB — TB SKIN TEST
Induration: 0 mm
TB Skin Test: NEGATIVE

## 2022-10-18 MED ORDER — FREESTYLE LIBRE 3 SENSOR MISC: 6 refills | Status: DC

## 2022-10-18 NOTE — Patient Instructions (Signed)
I have sent in an Rx for a continuous glucometer.  Check your sugars!  Labs today.  Follow up in 1 month.

## 2022-10-19 DIAGNOSIS — I10 Essential (primary) hypertension: Secondary | ICD-10-CM | POA: Diagnosis not present

## 2022-10-19 DIAGNOSIS — R748 Abnormal levels of other serum enzymes: Secondary | ICD-10-CM | POA: Diagnosis not present

## 2022-10-19 NOTE — Assessment & Plan Note (Signed)
Stable.  Continue lisinopril/HCTZ.  Labs ordered.

## 2022-10-19 NOTE — Assessment & Plan Note (Signed)
GGT and alkaline phosphatase isoenzymes for evaluation.

## 2022-10-19 NOTE — Assessment & Plan Note (Signed)
Uncontrolled.  We discussed this today.  She has an appointment scheduled with endocrinology.  Advised that I need to have blood sugar readings so I can make adjustments to her insulin.  Patient states that she has not tolerated metformin previously.  Discussed SGLT2.  I am sending in a freestyle libre to see if we can get this approved.

## 2022-10-19 NOTE — Progress Notes (Signed)
Subjective:  Patient ID: Anne Carney, female    DOB: [birth year]  Age: 47 y.o. MRN: [id number]  CC: Chief Complaint  Patient presents with   Hypertension    Follow up    HPI:  47 year old female with hypertension, GERD, uncontrolled type 2 diabetes, elevated alkaline phosphatase and elevated LFTs, hyperlipidemia, proteinuria presents for follow-up.  Patient's labs revealed uncontrolled type 2 diabetes and A1c of 13.1.  Proteinuria with microalbumin creatinine ratio 236.  Patient also has uncontrolled hyperlipidemia and markedly elevated alkaline phosphatase.  Patient still not checking her blood sugars.  Will discuss continuous glucometer.  She states that she is taking Levemir currently.  She states that one of her family members gave her a large supply of Lantus which she will be taking soon.  She takes 38 units twice daily.  Patient's hypertension is now well-controlled.  I restarted her lisinopril/HCTZ at her last visit.  Needs labs today.  Patient Active Problem List   Diagnosis Date Noted   Proteinuria 10/05/2022   Hyperlipidemia 10/05/2022   Elevated alkaline phosphatase level 01/26/2022   Elevated LFTs 11/28/2021   Gastroesophageal reflux disease    Primary hypertension    DM type 2 (diabetes mellitus, type 2) (HCC) 11/21/2015    Social Hx   Social History   Socioeconomic History   Marital status: Divorced    Spouse name: Not on file   Number of children: Not on file   Years of education: Not on file   Highest education level: Not on file  Occupational History   Not on file  Tobacco Use   Smoking status: Never   Smokeless tobacco: Never  Substance and Sexual Activity   Alcohol use: Yes    Comment: occ.   Drug use: Not Currently    Types: Marijuana    Comment: occasionally   Sexual activity: Yes    Birth control/protection: None  Other Topics Concern   Not on file  Social History Narrative   Not on file   Social Determinants of Health    Financial Resource Strain: Not on file  Food Insecurity: Not on file  Transportation Needs: Not on file  Physical Activity: Not on file  Stress: Not on file  Social Connections: Not on file    Review of Systems  Constitutional:  Positive for fatigue.    Objective:  BP 122/84   Ht 5\' 6"  (1.676 m)   Wt 160 lb (72.6 kg)   BMI 25.82 kg/m      10/18/2022    3:53 PM 10/04/2022    2:44 PM 07/25/2022   12:17 PM  BP/Weight  Systolic BP 122 158 150  Diastolic BP 84 84 90  Wt. (Lbs) 160 160   BMI 25.82 kg/m2 25.82 kg/m2     Physical Exam Vitals and nursing note reviewed.  Constitutional:      General: She is not in acute distress.    Appearance: Normal appearance.  HENT:     Head: Normocephalic and atraumatic.  Eyes:     General:        Right eye: No discharge.        Left eye: No discharge.     Conjunctiva/sclera: Conjunctivae normal.  Cardiovascular:     Rate and Rhythm: Normal rate and regular rhythm.  Pulmonary:     Effort: Pulmonary effort is normal.     Breath sounds: Normal breath sounds. No wheezing, rhonchi or rales.  Neurological:     Mental Status: She is alert.  Psychiatric:        Mood and Affect: Mood normal.        Behavior: Behavior normal.     Lab Results  Component Value Date   WBC 6.8 10/04/2022   HGB 11.6 (L) 10/04/2022   HCT 37.0 10/04/2022   PLT 171 10/04/2022   GLUCOSE 252 (H) 10/04/2022   CHOL 210 (H) 10/04/2022   TRIG 49 10/04/2022   HDL 88 10/04/2022   LDLCALC 108 (H) 10/04/2022   ALT 38 (H) 10/04/2022   AST 50 (H) 10/04/2022   NA 137 10/04/2022   K 4.1 10/04/2022   CL 97 (L) 10/04/2022   CREATININE 0.82 10/04/2022   BUN 18 10/04/2022   CO2 32 10/04/2022   HGBA1C 13.1 (H) 10/04/2022   MICROALBUR 17.2 10/04/2022     Assessment & Plan:   Problem List Items Addressed This Visit       Cardiovascular and Mediastinum   Primary hypertension    Stable.  Continue lisinopril/HCTZ.  Labs ordered.      Relevant Orders    Basic metabolic panel     Endocrine   DM type 2 (diabetes mellitus, type 2) (HCC) - Primary    Uncontrolled.  We discussed this today.  She has an appointment scheduled with endocrinology.  Advised that I need to have blood sugar readings so I can make adjustments to her insulin.  Patient states that she has not tolerated metformin previously.  Discussed SGLT2.  I am sending in a freestyle libre to see if we can get this approved.          Other   Elevated alkaline phosphatase level    GGT and alkaline phosphatase isoenzymes for evaluation.      Relevant Orders   Gamma GT   Alkaline Phosphatase Isoenzymes    Meds ordered this encounter  Medications   Continuous Glucose Sensor (FREESTYLE LIBRE 3 SENSOR) MISC    Sig: Apply every 2 weeks. Use continuously to check blood sugar.    Dispense:  2 each    Refill:  6    Follow-up:  Return in about 1 month (around 11/18/2022).  Everlene Other DO [facility]

## 2022-10-22 LAB — BASIC METABOLIC PANEL
BUN/Creatinine Ratio: 31 (calc) — ABNORMAL HIGH (ref 6–22)
BUN: 27 mg/dL — ABNORMAL HIGH (ref 7–25)
CO2: 29 mmol/L (ref 20–32)
Calcium: 9.5 mg/dL (ref 8.6–10.2)
Chloride: 101 mmol/L (ref 98–110)
Creat: 0.87 mg/dL (ref 0.50–0.99)
Glucose, Bld: 147 mg/dL — ABNORMAL HIGH (ref 65–99)
Potassium: 4.4 mmol/L (ref 3.5–5.3)
Sodium: 135 mmol/L (ref 135–146)

## 2022-10-22 LAB — ALKALINE PHOSPHATASE ISOENZYMES
Alkaline phosphatase (APISO): 364 U/L — ABNORMAL HIGH (ref 31–125)
Bone Isoenzymes: 23 % — ABNORMAL LOW (ref 28–66)
Intestinal Isoenzymes: 0 % — ABNORMAL LOW (ref 1–24)
Liver Isoenzymes: 63 % (ref 25–69)
Macrohepatic isoenzymes: 14 % — ABNORMAL HIGH (ref <=0)
Placental isoenzymes: 0 % (ref <=0)

## 2022-10-22 LAB — GAMMA GT: GGT: 238 U/L — ABNORMAL HIGH (ref 3–55)

## 2022-10-24 ENCOUNTER — Telehealth: Payer: Self-pay | Admitting: Pharmacist

## 2022-10-24 ENCOUNTER — Telehealth: Payer: Self-pay

## 2022-10-24 DIAGNOSIS — Z794 Long term (current) use of insulin: Secondary | ICD-10-CM

## 2022-10-24 NOTE — Progress Notes (Signed)
   Care Guide Note  10/24/2022 Name: Cloee Honegger MRN: [id number] DOB: [birth year]  Referred by: [name], DO Reason for referral : Care Coordination (Outreach to schedule with Pharm d )   Aileth Foo is a 47 y.o. year old female who is a primary care patient of [name], DO. Devona Sneider was referred to the pharmacist for assistance related to DM.    An unsuccessful telephone outreach was attempted today to contact the patient who was referred to the pharmacy team for assistance with medication management. Additional attempts will be made to contact the patient.   Penne Lash, RMA Care Guide [facility]  [place], [place] [address] Direct Dial: [phone] [email or web address]

## 2022-10-24 NOTE — Telephone Encounter (Signed)
LIBRE 3 CGM - T2DM ON INSULIN  PA CRITERIA QUESTIONS WERE NOT APPROPRIATE FOR DEVICE MAY HAVE TO CALL, BUT COMPLETED PA TO THE BEST OF MY ABILITY WILL HAVE PATIENT SCHEDULED WITH PHARMACY

## 2022-10-26 ENCOUNTER — Ambulatory Visit
Admission: EM | Admit: 2022-10-26 | Discharge: 2022-10-26 | Disposition: A | Discharge status: Home / Self Care | Payer: Medicaid Other | Attending: Internal Medicine | Admitting: Internal Medicine

## 2022-10-26 ENCOUNTER — Ambulatory Visit (INDEPENDENT_AMBULATORY_CARE_PROVIDER_SITE_OTHER): Payer: Medicaid Other

## 2022-10-26 DIAGNOSIS — M25532 Pain in left wrist: Secondary | ICD-10-CM

## 2022-10-26 DIAGNOSIS — M79645 Pain in left finger(s): Secondary | ICD-10-CM | POA: Diagnosis not present

## 2022-10-26 DIAGNOSIS — M19032 Primary osteoarthritis, left wrist: Secondary | ICD-10-CM | POA: Diagnosis not present

## 2022-10-26 MED ORDER — DICLOFENAC SODIUM 1 % EX GEL: 2.0000 g | Freq: Four times a day (QID) | CUTANEOUS | Status: DC

## 2022-10-26 MED ORDER — DICLOFENAC SODIUM 1 % EX GEL: 2.0000 g | Freq: Four times a day (QID) | CUTANEOUS | 0 refills | Status: DC

## 2022-10-26 NOTE — Discharge Instructions (Signed)
Wear the brace for one week and follow up with Dr Adriana Simas in 7-10 days

## 2022-10-26 NOTE — ED Provider Notes (Signed)
[facility]    CSN: [id number] Arrival date & time: 10/26/22  1741      History   Chief Complaint Chief Complaint  Patient presents with   Wrist Pain    HPI Anne Carney is a 47 y.o. female who presents with L wrist pain x 2 week and denies any kind of injury. She is R hand dominant. She works at [employer], admits she texts with her thumbs but not very much. She noticed a lump on her medial wrist where her pain is at. The pain radiates to her thumb, and pain is provoke with thumb movement and side to side wrist movement. She is not able to take Tylenol due to hx of elevated liver enzymes, and has not taken Ibuprofen since she has microalbuminuria and her DM is in poor control. Has FU with PCP next week to adjust her meds.     Past Medical History:  Diagnosis Date   Diabetes mellitus    Hypertension    Kidney disease    Neuropathy     Patient Active Problem List   Diagnosis Date Noted   Proteinuria 10/05/2022   Hyperlipidemia 10/05/2022   Elevated alkaline phosphatase level 01/26/2022   Elevated LFTs 11/28/2021   Gastroesophageal reflux disease    Primary hypertension    DM type 2 (diabetes mellitus, type 2) (HCC) 11/21/2015    Past Surgical History:  Procedure Laterality Date   BIOPSY  09/25/2021   Procedure: BIOPSY;  Surgeon: Lanelle Bal, DO;  [facility];  Service: Endoscopy;;   CESAREAN SECTION     COLONOSCOPY WITH PROPOFOL N/A 09/25/2021   Procedure: COLONOSCOPY WITH PROPOFOL;  Surgeon: Lanelle Bal, DO;  [facility];  Service: Endoscopy;  Laterality: N/A;  9:30am, ASA 3   COLONOSCOPY WITH PROPOFOL N/A 04/24/2022   Procedure: COLONOSCOPY WITH PROPOFOL;  Surgeon: Lanelle Bal, DO;  [facility];  Service: Endoscopy;  Laterality: N/A;  12:00pm, asa 2   ESOPHAGOGASTRODUODENOSCOPY (EGD) WITH PROPOFOL N/A 09/25/2021   Procedure: ESOPHAGOGASTRODUODENOSCOPY (EGD) WITH PROPOFOL;  Surgeon: Lanelle Bal,  DO;  [facility];  Service: Endoscopy;  Laterality: N/A;   POLYPECTOMY  04/24/2022   Procedure: POLYPECTOMY INTESTINAL;  Surgeon: Lanelle Bal, DO;  [facility];  Service: Endoscopy;;   TOE SURGERY Right    TOE SURGERY Left     OB History   No obstetric history on file.      Home Medications    Prior to Admission medications   Medication Sig Start Date End Date Taking? Authorizing Provider  diclofenac Sodium (VOLTAREN) 1 % GEL Apply 2 g topically 4 (four) times daily. 10/26/22  Yes Rodriguez-Southworth, Nettie Elm, PA-C  blood glucose meter kit and supplies KIT Dispense based on patient and insurance preference. Use to check sugar up ro four times daily as directed. 10/24/20   Vassie Loll, MD  Continuous Glucose Sensor (FREESTYLE LIBRE 3 SENSOR) MISC Apply every 2 weeks. Use continuously to check blood sugar. 10/18/22   [name], DO  furosemide (LASIX) 20 MG tablet Take 20 mg by mouth daily as needed. 09/28/20   [provider]  gabapentin (NEURONTIN) 100 MG capsule Take 1 capsule (100 mg total) by mouth 2 (two) times daily. Patient taking differently: Take 200 mg by mouth 2 (two) times daily. Take 2 100 mg tablets twice daily 10/24/20   Vassie Loll, MD  Iron, Ferrous Sulfate, 325 (65 Fe) MG TABS Take 325  mg by mouth every other day. 10/11/22   [name], DO  LEVEMIR FLEXPEN 100 UNIT/ML FlexPen SMARTSIG:38 Unit(s) SUB-Q Twice Daily 07/11/22   [provider]  linaclotide Karlene Einstein) 145 MCG CAPS capsule Take 1 capsule (145 mcg total) by mouth daily before breakfast. 07/26/22   [name], PA-C  lisinopril-hydrochlorothiazide (ZESTORETIC) 10-12.5 MG tablet Take 1 tablet by mouth daily. 10/05/22   [name], DO  meclizine (ANTIVERT) 25 MG tablet Take 1 tablet (25 mg total) by mouth every 8 (eight) hours as needed for dizziness. 10/24/20   Vassie Loll, MD  ondansetron (ZOFRAN) 4 MG tablet Take 1 tablet (4 mg total) by mouth every 4  (four) hours as needed for nausea or vomiting. 07/29/21   Tanda Rockers A, DO  pantoprazole (PROTONIX) 40 MG tablet Take 1 tablet (40 mg total) by mouth 2 (two) times daily before a meal. Patient taking differently: Take 40 mg by mouth daily as needed. 11/28/21 07/25/22  [name], PA-C  rosuvastatin (CRESTOR) 20 MG tablet Take 1 tablet (20 mg total) by mouth daily. 10/11/22   [name], DO    Family History Family History  Problem Relation Age of Onset   Huntington's disease Father    Asthma Mother    Diabetes Daughter     Social History Social History   Tobacco Use   Smoking status: Never   Smokeless tobacco: Never  Substance Use Topics   Alcohol use: Yes    Comment: occ.   Drug use: Not Currently    Types: Marijuana    Comment: occasionally     Allergies   Patient has no known allergies.   Review of Systems Review of Systems As noted in HPI  Physical Exam Triage Vital Signs ED Triage Vitals [10/26/22 1748]  Enc Vitals Group     BP (!) 171/92     Pulse Rate (!) 102     Resp 18     Temp 98.4 F (36.9 C)     Temp Source Oral     SpO2 98 %     Weight      Height      Head Circumference      Peak Flow      Pain Score      Pain Loc      Pain Edu?      Excl. in GC?    No data found.  Updated Vital Signs BP (!) 171/92 (BP Location: Left Arm)   Pulse (!) 102   Temp 98.4 F (36.9 C) (Oral)   Resp 18   SpO2 98%   Visual Acuity Right Eye Distance:   Left Eye Distance:   Bilateral Distance:    Right Eye Near:   Left Eye Near:    Bilateral Near:     Physical Exam Vitals and nursing note reviewed.  Constitutional:      General: She is not in acute distress.    Appearance: She is not toxic-appearing.  Pulmonary:     Effort: Pulmonary effort is normal.  Musculoskeletal:     Cervical back: Neck supple.     Comments: L WRIST- with mild swelling on medial distal radial region where she has a be-be side firm lump which is tender.  L HAND- non  tender except for proximal thumb region, but has good ROM of thumb.   Skin:    General: Skin is warm and dry.     Findings: No bruising or erythema.  Neurological:     Mental Status: She is alert and oriented to person, place, and time.  Psychiatric:        Mood and Affect: Mood normal.        Behavior: Behavior normal.        Thought Content: Thought content normal.        Judgment: Judgment normal.      UC Treatments / Results  Labs (all labs ordered are listed, but only abnormal results are displayed) Labs Reviewed - No data to display  EKG   Radiology DG Wrist Complete Left  Result Date: 10/26/2022 CLINICAL DATA:  Pain and lump of the left distal radius felt for 2 weeks. No known trauma. EXAM: LEFT WRIST - COMPLETE 3+ VIEW COMPARISON:  None available FINDINGS: There is neutral ulnar variance. Minimal distal radioulnar joint peripheral degenerative spurring. Otherwise, joint spaces are preserved. No acute fracture or dislocation. IMPRESSION: Minimal distal radioulnar joint peripheral degenerative spurring. No acute bony abnormality. Electronically Signed   By: Neita Garnet M.D.   On: 10/26/2022 18:19    Procedures Procedures (including critical care time)  Medications Ordered in UC Medications - No data to display  Initial Impression / Assessment and Plan / UC Course  I have reviewed the triage vital signs and the nursing notes.  Pertinent  imaging results that were available during my care of the patient were reviewed by me and considered in my medical decision making (see chart for details).   L wrist pain OA L wrist  She was placed on a wrist splint and Voltaren gel as noted. Needs to FU  with her PCP in 7-10 days  Final Clinical Impressions(s) / UC Diagnoses   Final diagnoses:  Localized primary osteoarthritis of wrist, left  Wrist pain, acute, left  Thumb pain, left     Discharge Instructions      Wear the brace for one week and follow up with Dr  Adriana Simas in 7-10 days     ED Prescriptions     Medication Sig Dispense Auth. Provider   diclofenac Sodium (VOLTAREN) 1 % GEL Apply 2 g topically 4 (four) times daily. 150 g Rodriguez-Southworth, Nettie Elm, PA-C      PDMP not reviewed this encounter.   Garey Ham, Cordelia Poche 10/26/22 1905

## 2022-10-26 NOTE — ED Triage Notes (Signed)
Pt reports L-wrist pain x 2 weeks. Denies any trauma or injury to her wrist.

## 2022-10-29 NOTE — Progress Notes (Signed)
   Care Guide Note  10/29/2022 Name: Anne Carney MRN: [id number] DOB: November 19, 1975  Referred by: [name], DO Reason for referral : Care Coordination (Outreach to schedule with Pharm d )   Anne Carney is a 47 y.o. year old female who is a primary care patient of [name], DO. Anne Carney was referred to the pharmacist for assistance related to DM.    Successful contact was made with the patient to discuss pharmacy services including being ready for the pharmacist to call at least 5 minutes before the scheduled appointment time, to have medication bottles and any blood sugar or blood pressure readings ready for review. The patient agreed to meet with the pharmacist via with the pharmacist via telephone visit on (date/time).  11/07/2022  Anne Carney, RMA Care Guide [facility]  [place], [place] [address] Direct Dial: [phone] [email or web address] .

## 2022-10-30 ENCOUNTER — Encounter: Payer: Self-pay | Admitting: Gastroenterology

## 2022-10-30 ENCOUNTER — Ambulatory Visit (INDEPENDENT_AMBULATORY_CARE_PROVIDER_SITE_OTHER): Payer: Medicaid Other | Admitting: Gastroenterology

## 2022-10-30 VITALS — BP 136/79 | HR 94 | Temp 97.8°F | Ht 65.0 in | Wt 170.6 lb

## 2022-10-30 DIAGNOSIS — R7989 Other specified abnormal findings of blood chemistry: Secondary | ICD-10-CM | POA: Diagnosis not present

## 2022-10-30 NOTE — Addendum Note (Signed)
Addended by: Margaretha Sheffield on: 10/30/2022 03:35 PM   Modules accepted: Orders

## 2022-10-30 NOTE — Patient Instructions (Signed)
Complete stool test and return to our office. Wait at least one week after your menstrual cycle is over before collecting specimen.

## 2022-10-30 NOTE — Addendum Note (Signed)
Addended by: Margaretha Sheffield on: 10/30/2022 03:31 PM   Modules accepted: Orders

## 2022-10-30 NOTE — Progress Notes (Signed)
GI Office Note    Referring Provider: [name], DO Primary Care Physician:  [name], DO  Primary Gastroenterologist: Hennie Duos. Marletta Lor, DO   Chief Complaint   Chief Complaint  Patient presents with   Follow-up    Follow up. Elevated LFT      History of Present Illness   Anne Carney is a 47 y.o. female presenting today for follow-up.  Last seen in February.  History of GERD, left upper quadrant pain, early satiety/nausea.  Doing ok. No specific complaints. Reflux doing ok. Continues to have some early satiety and nausea at times. Managing her constipation at this time. No melena, brbpr.  Here to follow up about elevated LFTs. Rarely consumes etoh. Her son Debbra Riding Murchinson is a patient here, had liver biopsy (nonspecific findings). Has had elevated ferritin, iron sat, but negative hemochromatosis genetics. Pending referral to [facility] due to inconclusive work up.   Patient has appt with endocrinology pending, due to DM. Endo appt pending.   Prior data:   Labs for elevated LFTs March 2023, LFTs normal except for alk phos of 251.  Hep B and C screen negative.  AMA less than 20.  Platelets slightly low, 142,000.  No iron overload or trend towards iron deficiency.  A1c was 11.5. Labs from May 2023: GGT 163.  Labs from April 2024: PBC panel negative.  Hemoglobin 11.6, hematocrit 37, MCV 86.4, platelets normal, ferritin 13, iron 106, TIBC 432, iron saturation is 25%, B12 and folate normal.  A1c 13.1, total bilirubin 0.6, alkaline phosphatase 382, AST 50, ALT 38, albumin 3.8.  Labs from May 2024: GGT 238, alkaline phosphatase 364, intestinal isoenzyme 0, bone isoenzymes 23, liver enzymes 63, macro hepatic isoenzymes 14.  Abdominal ultrasound April 2023: Liver unremarkable, 3.7 cm left renal upper pole mass noted.   CT abdomen with and without contrast April 2023 for renal mass: Liver unremarkable.  No renal mass seen.  Tiny calyceal diverticulum is incidentally  noted in the upper pole of the left kidney.    Right upper quadrant ultrasound January 2024: Normal.  MRI Abd/MRCP 03/2022: IMPRESSION: Motion degraded examination.  Within this context: 1. No acute abnormality identified in the abdomen. 2. Moderate volume of formed stool throughout the colon. 3. Uterine leiomyomas.   CT abdomen pelvis with contrast November 2023: IMPRESSION: 1. No acute intra-abdominal or pelvic pathology. 2. Moderate colonic stool burden. No bowel obstruction. Normal appendix. 3. Uterine fibroid. 4. Displaced left tubal ligation clip. RUQ U/S 06/2022: normal   Colonoscopy 04/2022: -two 3-50mm polyps in descending colon, stool in entire colon -repeat colonoscopy in 5 years for surveillance and borderline prep -sessile serrated adenoma.     EGD April 2023: - Z-line regular, 35 cm from the incisors. - Erythematous mucosa in the antrum. Biopsied. - Normal duodenal bulb, first portion of the duodenum and second portion of the duodenum. -Gastric biopsy with mild nonspecific reactive gastropathy, no H. Pylori   Colonoscopy April 2023: - Preparation of the colon was inadequate. - Stool in the entire examined colon. - No specimens collected. -Attempt colonoscopy in 3 to 6 months.   Medications   Current Outpatient Medications  Medication Sig Dispense Refill   diclofenac Sodium (VOLTAREN) 1 % GEL Apply 2 g topically 4 (four) times daily. 150 g 0   furosemide (LASIX) 20 MG tablet Take 20 mg by mouth daily as needed.     gabapentin (NEURONTIN) 100 MG capsule Take 1 capsule (100 mg total) by  mouth 2 (two) times daily. (Patient taking differently: Take 200 mg by mouth 2 (two) times daily. Take 2 100 mg tablets twice daily)     Iron, Ferrous Sulfate, 325 (65 Fe) MG TABS Take 325 mg by mouth every other day. 45 tablet 1   LEVEMIR FLEXPEN 100 UNIT/ML FlexPen SMARTSIG:38 Unit(s) SUB-Q Twice Daily     lisinopril-hydrochlorothiazide (ZESTORETIC) 10-12.5 MG tablet Take 1  tablet by mouth daily. 90 tablet 3   meclizine (ANTIVERT) 25 MG tablet Take 1 tablet (25 mg total) by mouth every 8 (eight) hours as needed for dizziness. 30 tablet 0   ondansetron (ZOFRAN) 4 MG tablet Take 1 tablet (4 mg total) by mouth every 4 (four) hours as needed for nausea or vomiting. 5 tablet 0   rosuvastatin (CRESTOR) 20 MG tablet Take 1 tablet (20 mg total) by mouth daily. 90 tablet 3   blood glucose meter kit and supplies KIT Dispense based on patient and insurance preference. Use to check sugar up ro four times daily as directed. (Patient not taking: Reported on 10/30/2022) 1 each 0   Continuous Glucose Sensor (FREESTYLE LIBRE 3 SENSOR) MISC Apply every 2 weeks. Use continuously to check blood sugar. (Patient not taking: Reported on 10/30/2022) 2 each 6   No current facility-administered medications for this visit.    Allergies   Allergies as of 10/30/2022   (No Known Allergies)       Review of Systems   General: Negative for anorexia, weight loss, fever, chills, fatigue, weakness. ENT: Negative for hoarseness, difficulty swallowing , nasal congestion. CV: Negative for chest pain, angina, palpitations, dyspnea on exertion, peripheral edema.  Respiratory: Negative for dyspnea at rest, dyspnea on exertion, cough, sputum, wheezing.  GI: See history of present illness. GU:  Negative for dysuria, hematuria, urinary incontinence, urinary frequency, nocturnal urination.  Endo: Negative for unusual weight change.     Physical Exam   BP 136/79 (BP Location: Left Arm, Patient Position: Sitting, Cuff Size: Normal)   Pulse 94   Temp 97.8 F (36.6 C) (Temporal)   Ht 5\' 5"  (1.651 m)   Wt 170 lb 9.6 oz (77.4 kg)   LMP 10/25/2022 (Approximate)   SpO2 99%   BMI 28.39 kg/m    General: Well-nourished, well-developed in no acute distress.  Eyes: No icterus. Mouth: Oropharyngeal mucosa moist and pink  Abdomen: Bowel sounds are normal, nontender, nondistended, no hepatosplenomegaly or  masses,  no abdominal bruits or hernia , no rebound or guarding.  Rectal: not performed Extremities: No lower extremity edema. No clubbing or deformities. Neuro: Alert and oriented x 4   Skin: Warm and dry, no jaundice.   Psych: Alert and cooperative, normal mood and affect.  Labs   See hpi  Imaging Studies   DG Wrist Complete Left  Result Date: 10/26/2022 CLINICAL DATA:  Pain and lump of the left distal radius felt for 2 weeks. No known trauma. EXAM: LEFT WRIST - COMPLETE 3+ VIEW COMPARISON:  None available FINDINGS: There is neutral ulnar variance. Minimal distal radioulnar joint peripheral degenerative spurring. Otherwise, joint spaces are preserved. No acute fracture or dislocation. IMPRESSION: Minimal distal radioulnar joint peripheral degenerative spurring. No acute bony abnormality. Electronically Signed   By: Neita Garnet M.D.   On: 10/26/2022 18:19    Assessment/Plan:   IDA: colonoscopy/EGD up to date. Check ifobt. Consider screen for celiac disease, although suspicion low.   Elevated LFTs: chronically elevated alk phos, currently in the 300 range. Mildly elevated AST. GGT elevated.  AMA and PBC panel negative. Liver normal on imaging (CT, MRI, U/S). Consider checking immunoglobulins. Will discuss further work up with hepatology colleague at [facility].    Leanna Battles. Melvyn Neth, MHS, PA-C [facility]

## 2022-11-01 DIAGNOSIS — Z0120 Encounter for dental examination and cleaning without abnormal findings: Secondary | ICD-10-CM | POA: Diagnosis not present

## 2022-11-06 NOTE — Telephone Encounter (Signed)
  PA SUBMITTED VIA [facility]

## 2022-11-07 ENCOUNTER — Encounter: Payer: Self-pay | Admitting: Orthopaedic Surgery

## 2022-11-07 ENCOUNTER — Other Ambulatory Visit: Payer: Medicaid Other | Admitting: Pharmacist

## 2022-11-07 ENCOUNTER — Telehealth: Payer: Self-pay | Admitting: Family Medicine

## 2022-11-07 ENCOUNTER — Ambulatory Visit: Payer: Medicaid Other | Admitting: Orthopaedic Surgery

## 2022-11-07 VITALS — BP 165/117 | HR 90 | Ht 65.0 in | Wt 173.0 lb

## 2022-11-07 DIAGNOSIS — M654 Radial styloid tenosynovitis [de Quervain]: Secondary | ICD-10-CM | POA: Diagnosis not present

## 2022-11-07 MED ORDER — METHYLPREDNISOLONE ACETATE 40 MG/ML IJ SUSP: 40.0000 mg | Freq: Once | INTRAMUSCULAR | Status: DC

## 2022-11-07 NOTE — Progress Notes (Signed)
Subjective:    Patient ID: Anne Carney, female    DOB: [birth year], 47 y.o.   MRN: [id number]  HPI She has had left wrist and thumb pain for six weeks or so that is not getting better.  She was seen at Urgent Care 10-26-22 and given a splint and Voltaren Gel.  She is not getting any better. X-rays were done at the Urgent Care.  I have reviewed the Urgent care notes and X-rays.  I have independently reviewed and interpreted x-rays of this patient done at another site by another physician or qualified health professional.  She has pain over the first compartment.  She has no swelling.   Review of Systems  Constitutional:  Positive for activity change.  Musculoskeletal:  Positive for arthralgias and myalgias.  All other systems reviewed and are negative. For Review of Systems, all other systems reviewed and are negative.  The following is a summary of the past history medically, past history surgically, known current medicines, social history and family history.  This information is gathered electronically by the computer from prior information and documentation.  I review this each visit and have found including this information at this point in the chart is beneficial and informative.   Past Medical History:  Diagnosis Date   Diabetes mellitus    Hypertension    Kidney disease    Neuropathy     Past Surgical History:  Procedure Laterality Date   BIOPSY  09/25/2021   Procedure: BIOPSY;  Surgeon: Lanelle Bal, DO;  [facility];  Service: Endoscopy;;   CESAREAN SECTION     COLONOSCOPY WITH PROPOFOL N/A 09/25/2021   Procedure: COLONOSCOPY WITH PROPOFOL;  Surgeon: Lanelle Bal, DO;  [facility];  Service: Endoscopy;  Laterality: N/A;  9:30am, ASA 3   COLONOSCOPY WITH PROPOFOL N/A 04/24/2022   Procedure: COLONOSCOPY WITH PROPOFOL;  Surgeon: Lanelle Bal, DO;  [facility];  Service: Endoscopy;  Laterality: N/A;  12:00pm, asa 2    ESOPHAGOGASTRODUODENOSCOPY (EGD) WITH PROPOFOL N/A 09/25/2021   Procedure: ESOPHAGOGASTRODUODENOSCOPY (EGD) WITH PROPOFOL;  Surgeon: Lanelle Bal, DO;  [facility];  Service: Endoscopy;  Laterality: N/A;   POLYPECTOMY  04/24/2022   Procedure: POLYPECTOMY INTESTINAL;  Surgeon: Lanelle Bal, DO;  [facility];  Service: Endoscopy;;   TOE SURGERY Right    TOE SURGERY Left     Current Outpatient Medications on File Prior to Visit  Medication Sig Dispense Refill   blood glucose meter kit and supplies KIT Dispense based on patient and insurance preference. Use to check sugar up ro four times daily as directed. 1 each 0   Continuous Glucose Sensor (FREESTYLE LIBRE 3 SENSOR) MISC Apply every 2 weeks. Use continuously to check blood sugar. 2 each 6   diclofenac Sodium (VOLTAREN) 1 % GEL Apply 2 g topically 4 (four) times daily. 150 g 0   furosemide (LASIX) 20 MG tablet Take 20 mg by mouth daily as needed.     gabapentin (NEURONTIN) 100 MG capsule Take 1 capsule (100 mg total) by mouth 2 (two) times daily. (Patient taking differently: Take 200 mg by mouth 2 (two) times daily. Take 2 100 mg tablets twice daily)     Iron, Ferrous Sulfate, 325 (65 Fe) MG TABS Take 325 mg by mouth every other day. 45 tablet 1   LEVEMIR FLEXPEN 100 UNIT/ML FlexPen SMARTSIG:38 Unit(s) SUB-Q Twice Daily     lisinopril-hydrochlorothiazide (ZESTORETIC) 10-12.5 MG  tablet Take 1 tablet by mouth daily. 90 tablet 3   meclizine (ANTIVERT) 25 MG tablet Take 1 tablet (25 mg total) by mouth every 8 (eight) hours as needed for dizziness. 30 tablet 0   ondansetron (ZOFRAN) 4 MG tablet Take 1 tablet (4 mg total) by mouth every 4 (four) hours as needed for nausea or vomiting. 5 tablet 0   rosuvastatin (CRESTOR) 20 MG tablet Take 1 tablet (20 mg total) by mouth daily. 90 tablet 3   No current facility-administered medications on file prior to visit.    Social History   Socioeconomic History   Marital  status: Divorced    Spouse name: Not on file   Number of children: Not on file   Years of education: Not on file   Highest education level: Not on file  Occupational History   Not on file  Tobacco Use   Smoking status: Never   Smokeless tobacco: Never  Substance and Sexual Activity   Alcohol use: Yes    Comment: occ.   Drug use: Not Currently    Types: Marijuana    Comment: occasionally   Sexual activity: Yes    Birth control/protection: None  Other Topics Concern   Not on file  Social History Narrative   Not on file   Social Determinants of Health   Financial Resource Strain: Not on file  Food Insecurity: Not on file  Transportation Needs: Not on file  Physical Activity: Not on file  Stress: Not on file  Social Connections: Not on file  Intimate Partner Violence: Not on file    Family History  Problem Relation Age of Onset   Huntington's disease Father    Asthma Mother    Diabetes Daughter     BP (!) 165/117   Pulse 90   Ht 5\' 5"  (1.651 m)   Wt 173 lb (78.5 kg)   LMP 10/25/2022 (Approximate)   BMI 28.79 kg/m   Body mass index is 28.79 kg/m.      Objective:   Physical Exam Vitals and nursing note reviewed. Exam conducted with a chaperone present.  Constitutional:      Appearance: She is well-developed.  HENT:     Head: Normocephalic and atraumatic.  Eyes:     Conjunctiva/sclera: Conjunctivae normal.     Pupils: Pupils are equal, round, and reactive to light.  Cardiovascular:     Rate and Rhythm: Normal rate and regular rhythm.  Pulmonary:     Effort: Pulmonary effort is normal.  Abdominal:     Palpations: Abdomen is soft.  Musculoskeletal:       Hands:     Cervical back: Normal range of motion and neck supple.  Skin:    General: Skin is warm and dry.  Neurological:     Mental Status: She is alert and oriented to person, place, and time.     Cranial Nerves: No cranial nerve deficit.     Motor: No abnormal muscle tone.     Coordination:  Coordination normal.     Deep Tendon Reflexes: Reflexes are normal and symmetric. Reflexes normal.  Psychiatric:        Behavior: Behavior normal.        Thought Content: Thought content normal.        Judgment: Judgment normal.           Assessment & Plan:   Encounter Diagnosis  Name Primary?   De Quervain's tenosynovitis, left Yes   Procedure note: The first extensor  compartment of the left hand/wrist was prepped.  I injected the area with 1% Xylocaine and 1 cc DeproMedrol 40 by sterile technique tolerated well.  Keep wearing the splint.  Use the Gel.  Return in two weeks.  Call if any problem.  Precautions discussed.  Electronically Signed Darreld Mclean, MD 5/29/202410:09 AM

## 2022-11-07 NOTE — Telephone Encounter (Signed)
Spoke with patient and she states her lower legs as well as her ankles have been swelling for the past week and a half. Started taking lisinopril/hctz in the last 1.5 weeks. Feels has gained some fluid weight as well per chart 3 lbs approx. Please advise

## 2022-11-07 NOTE — Telephone Encounter (Signed)
Patient still having issues with swelling in her ankles and wondering if you can send in something different or could she take one in the am and already taking one in the pm before bed. To see id that will help with swelling.[facility]

## 2022-11-07 NOTE — Telephone Encounter (Signed)
Left a message for the patient to return the call for details.

## 2022-11-07 NOTE — Progress Notes (Signed)
11/07/2022 Name: Anne Carney MRN: [id number] DOB: 08-Sep-1975  Diabetes Management   Anne Carney is a 47 y.o. year old female who presented for a telephone visit.   They were referred to the pharmacist by their PCP for assistance in managing diabetes.  Of note patient is scheduled to meet with Endocrinology in 01/2023.  Patient is participating in a Managed Medicaid Plan:  Yes  Subjective:  Care Team: Primary Care Provider: [name], DO   Medication Access/Adherence  Current Pharmacy:  [facility] [address] [place] - [address] [place] [address] Phone: [phone] Fax: [phone]   Patient reports affordability concerns with their medications: No  Patient reports access/transportation concerns to their pharmacy: No  Patient reports adherence concerns with their medications:  Yes    Diabetes:  Current medications: Levemir BID,  Medications tried in the past:  Did not tolerate metformin, janumet, glipizide  Current glucose readings: 200+; not compliant with checking  -Patient is testing blood sugar 6 times daily -Patient is injecting insulin 4 or more times daily -He would greatly benefit from a continuous glucose monitoring system (I.e. libre or dexcom)  Patient denies hypoglycemic s/sx including dizziness, shakiness, sweating. Patient reports hyperglycemic symptoms including polyuria, polydipsia, polyphagia, nocturia, neuropathy, blurred vision.  Current physical activity: encouraged as able  Current medication access support: medicaid    Objective:  Lab Results  Component Value Date   HGBA1C 13.1 (H) 10/04/2022    Lab Results  Component Value Date   CREATININE 0.87 10/19/2022   BUN 27 (H) 10/19/2022   NA 135 10/19/2022   K 4.4 10/19/2022   CL 101 10/19/2022   CO2 29 10/19/2022    Lab Results  Component Value Date   CHOL 210 (H) 10/04/2022   HDL 88 10/04/2022   LDLCALC 108 (H) 10/04/2022    TRIG 49 10/04/2022   CHOLHDL 2.4 10/04/2022    Medications Reviewed Today     Reviewed by Caffie Damme, RT (Technologist) on 11/07/22 at 0815  Med List Status: <None>   Medication Order Taking? Sig Documenting Provider Last Dose Status Informant  blood glucose meter kit and supplies KIT [id number]  Dispense based on patient and insurance preference. Use to check sugar up ro four times daily as directed.  Patient not taking: Reported on 10/30/2022   Vassie Loll, MD  Active   Continuous Glucose Sensor (FREESTYLE LIBRE 3 SENSOR) [place] [id number]  Apply every 2 weeks. Use continuously to check blood sugar.  Patient not taking: Reported on 10/30/2022   [name], DO  Active   diclofenac Sodium (VOLTAREN) 1 % GEL [id number]  Apply 2 g topically 4 (four) times daily. Rodriguez-Southworth, Nettie Elm, PA-C  Active   furosemide (LASIX) 20 MG tablet [id number]  Take 20 mg by mouth daily as needed. [provider]  Active Self  gabapentin (NEURONTIN) 100 MG capsule [id number]  Take 1 capsule (100 mg total) by mouth 2 (two) times daily.  Patient taking differently: Take 200 mg by mouth 2 (two) times daily. Take 2 100 mg tablets twice daily   Vassie Loll, MD  Active   Iron, Ferrous Sulfate, 325 (65 Fe) MG TABS [id number]  Take 325 mg by mouth every other day. Everlene Other G, DO  Active   LEVEMIR FLEXPEN 100 UNIT/ML FlexPen [id number]  SMARTSIG:38 Unit(s) SUB-Q Twice Daily [provider]  Active   lisinopril-hydrochlorothiazide (ZESTORETIC) 10-12.5 MG tablet [id number]  Take 1 tablet by mouth daily.  Everlene Other G, DO  Active   meclizine (ANTIVERT) 25 MG tablet [id number]  Take 1 tablet (25 mg total) by mouth every 8 (eight) hours as needed for dizziness. Vassie Loll, MD  Active   ondansetron [facility]) 4 MG tablet [id number]  Take 1 tablet (4 mg total) by mouth every 4 (four) hours as needed for nausea or vomiting. Sloan Leiter, DO  Active   rosuvastatin (CRESTOR) 20 MG tablet [id number]   Take 1 tablet (20 mg total) by mouth daily. [name], DO  Active               Assessment/Plan:   Diabetes: - Currently uncontrolled - Reviewed long term cardiovascular and renal outcomes of uncontrolled blood sugar - Reviewed goal A1c, goal fasting, and goal 2 hour post prandial glucose - Reviewed dietary modifications including healthy plate method - Reviewed lifestyle modifications including:increase water intake, increase exercise as able - Recommend to: Start Libre 3 CGM --PA sent to [facility] (pending)  Change to [place] u-200 pens (pt currently using Levemir vials from uncle) Consider adding GLP1 at f/u next week at face to face appt Consider adding SGTL2--GFR 64 - Recommend to check glucose using CGM continuously  Follow Up Plan: face to face appt next wed at 9am with Pharmd  Kieth Brightly, PharmD, BCACP Clinical Pharmacist, [facility]

## 2022-11-08 ENCOUNTER — Encounter (HOSPITAL_COMMUNITY): Payer: Self-pay

## 2022-11-08 ENCOUNTER — Emergency Department (HOSPITAL_COMMUNITY)
Admission: EM | Admit: 2022-11-08 | Discharge: 2022-11-09 | Disposition: A | Discharge status: Home / Self Care | Payer: Medicaid Other | Attending: Emergency Medicine | Admitting: Emergency Medicine

## 2022-11-08 DIAGNOSIS — Z79899 Other long term (current) drug therapy: Secondary | ICD-10-CM | POA: Diagnosis not present

## 2022-11-08 DIAGNOSIS — I10 Essential (primary) hypertension: Secondary | ICD-10-CM | POA: Insufficient documentation

## 2022-11-08 DIAGNOSIS — E11649 Type 2 diabetes mellitus with hypoglycemia without coma: Secondary | ICD-10-CM | POA: Insufficient documentation

## 2022-11-08 DIAGNOSIS — E162 Hypoglycemia, unspecified: Secondary | ICD-10-CM | POA: Diagnosis present

## 2022-11-08 DIAGNOSIS — E161 Other hypoglycemia: Secondary | ICD-10-CM | POA: Diagnosis not present

## 2022-11-08 LAB — BASIC METABOLIC PANEL
Anion gap: 12 (ref 5–15)
BUN: 35 mg/dL — ABNORMAL HIGH (ref 6–20)
CO2: 27 mmol/L (ref 22–32)
Calcium: 9 mg/dL (ref 8.9–10.3)
Chloride: 97 mmol/L — ABNORMAL LOW (ref 98–111)
Creatinine, Ser: 1.01 mg/dL — ABNORMAL HIGH (ref 0.44–1.00)
GFR, Estimated: >60 mL/min (ref >60)
Glucose, Bld: 66 mg/dL — ABNORMAL LOW (ref 70–99)
Potassium: 3.5 mmol/L (ref 3.5–5.1)
Sodium: 136 mmol/L (ref 135–145)

## 2022-11-08 LAB — CBC
HCT: 34.7 % — ABNORMAL LOW (ref 36.0–46.0)
Hemoglobin: 11.2 g/dL — ABNORMAL LOW (ref 12.0–15.0)
MCH: 29.2 pg (ref 26.0–34.0)
MCHC: 32.3 g/dL (ref 30.0–36.0)
MCV: 90.6 fL (ref 80.0–100.0)
Platelets: 151 10*3/uL (ref 150–400)
RBC: 3.83 MIL/uL — ABNORMAL LOW (ref 3.87–5.11)
RDW: 15 % (ref 11.5–15.5)
WBC: 8.7 10*3/uL (ref 4.0–10.5)
nRBC: 0 % (ref 0.0–0.2)

## 2022-11-08 LAB — CBG MONITORING, ED
Glucose-Capillary: 65 mg/dL — ABNORMAL LOW (ref 70–99)
Glucose-Capillary: 83 mg/dL (ref 70–99)

## 2022-11-08 NOTE — ED Provider Notes (Signed)
[place] EMERGENCY DEPARTMENT AT [facility] Provider Note   CSN: [id number] Arrival date & time: 11/08/22  2231     History  Chief Complaint  Patient presents with   Hypoglycemia    Anne Carney is a 47 y.o. female.  Patient is a 47 year old female with past medical history of diabetes, hyperlipidemia, hypertension.  Patient brought by EMS for evaluation of low blood sugar.  Patient began feeling weak and lightheaded and checked her sugar and got a low reading.  She then ate peanut butter, then was transported here by ambulance.  EMS with blood sugars in the 80s initially.  Patient denies any recent illness.  She denies other complaints.  The history is provided by the patient.       Home Medications Prior to Admission medications   Medication Sig Start Date End Date Taking? Authorizing Provider  blood glucose meter kit and supplies KIT Dispense based on patient and insurance preference. Use to check sugar up ro four times daily as directed. 10/24/20   Vassie Loll, MD  Continuous Glucose Sensor (FREESTYLE LIBRE 3 SENSOR) MISC Apply every 2 weeks. Use continuously to check blood sugar. 10/18/22   [name], DO  diclofenac Sodium (VOLTAREN) 1 % GEL Apply 2 g topically 4 (four) times daily. 10/26/22   Rodriguez-Southworth, Nettie Elm, PA-C  furosemide (LASIX) 20 MG tablet Take 20 mg by mouth daily as needed. 09/28/20   [provider]  gabapentin (NEURONTIN) 100 MG capsule Take 1 capsule (100 mg total) by mouth 2 (two) times daily. Patient taking differently: Take 200 mg by mouth 2 (two) times daily. Take 2 100 mg tablets twice daily 10/24/20   Vassie Loll, MD  Iron, Ferrous Sulfate, 325 (65 Fe) MG TABS Take 325 mg by mouth every other day. 10/11/22   [name], DO  LEVEMIR FLEXPEN 100 UNIT/ML FlexPen SMARTSIG:38 Unit(s) SUB-Q Twice Daily 07/11/22   [provider]  lisinopril-hydrochlorothiazide (ZESTORETIC) 10-12.5 MG tablet Take 1 tablet by mouth  daily. 10/05/22   [name], DO  meclizine (ANTIVERT) 25 MG tablet Take 1 tablet (25 mg total) by mouth every 8 (eight) hours as needed for dizziness. 10/24/20   Vassie Loll, MD  ondansetron (ZOFRAN) 4 MG tablet Take 1 tablet (4 mg total) by mouth every 4 (four) hours as needed for nausea or vomiting. 07/29/21   Tanda Rockers A, DO  rosuvastatin (CRESTOR) 20 MG tablet Take 1 tablet (20 mg total) by mouth daily. 10/11/22   [name], DO      Allergies    Patient has no known allergies.    Review of Systems   Review of Systems  All other systems reviewed and are negative.   Physical Exam Updated Vital Signs BP (!) 156/86   Pulse 99   Temp 97.6 F (36.4 C) (Oral)   Resp 15   Ht 5\' 5"  (1.651 m)   Wt 78.5 kg   LMP 10/25/2022 (Approximate)   SpO2 96%   BMI 28.79 kg/m  Physical Exam Vitals and nursing note reviewed.  Constitutional:      General: She is not in acute distress.    Appearance: She is well-developed. She is not diaphoretic.  HENT:     Head: Normocephalic and atraumatic.  Cardiovascular:     Rate and Rhythm: Normal rate and regular rhythm.     Heart sounds: No murmur heard.    No friction rub. No gallop.  Pulmonary:     Effort:  Pulmonary effort is normal. No respiratory distress.     Breath sounds: Normal breath sounds. No wheezing.  Abdominal:     General: Bowel sounds are normal. There is no distension.     Palpations: Abdomen is soft.     Tenderness: There is no abdominal tenderness.  Musculoskeletal:        General: Normal range of motion.     Cervical back: Normal range of motion and neck supple.  Skin:    General: Skin is warm and dry.  Neurological:     General: No focal deficit present.     Mental Status: She is alert and oriented to person, place, and time.     ED Results / Procedures / Treatments   Labs (all labs ordered are listed, but only abnormal results are displayed) Labs Reviewed  CBC - Abnormal; Notable for the following  components:      Result Value   RBC 3.83 (*)    Hemoglobin 11.2 (*)    HCT 34.7 (*)    All other components within normal limits  BASIC METABOLIC PANEL - Abnormal; Notable for the following components:   Chloride 97 (*)    Glucose, Bld 66 (*)    BUN 35 (*)    Creatinine, Ser 1.01 (*)    All other components within normal limits  CBG MONITORING, ED - Abnormal; Notable for the following components:   Glucose-Capillary 65 (*)    All other components within normal limits  CBG MONITORING, ED  CBG MONITORING, ED    EKG None  Radiology No results found.  Procedures Procedures    Medications Ordered in ED Medications - No data to display  ED Course/ Medical Decision Making/ A&P  Patient brought by EMS for evaluation of altered mental status and low blood sugar.  Patient ate peanut butter prior to coming here.  She arrives here with stable vital signs and basically unremarkable physical exam.  Initial blood sugar here was 83, but then dropped to 37.  Patient given D50 along with food and drink.  She has been observed for several hours and sugars have now maintained.  Electrolyte panel was within normal limits as was CBC.  At this point, patient's blood sugars seem to be holding steady and I feel that patient can safely be discharged.  Final Clinical Impression(s) / ED Diagnoses Final diagnoses:  None    Rx / DC Orders ED Discharge Orders     None         Geoffery Lyons, MD 11/09/22 [phone]

## 2022-11-08 NOTE — ED Triage Notes (Signed)
Pt comes via [facility], called out for hypoglycemia, initially in the 80's with EMS, pt had several peanut butters, has not ate dinner yet.

## 2022-11-08 NOTE — ED Notes (Signed)
Pt given sandwich 

## 2022-11-09 LAB — CBG MONITORING, ED
Glucose-Capillary: 129 mg/dL — ABNORMAL HIGH (ref 70–99)
Glucose-Capillary: 37 mg/dL — CL (ref 70–99)
Glucose-Capillary: 95 mg/dL (ref 70–99)

## 2022-11-09 MED ORDER — ONDANSETRON HCL 4 MG/2ML IJ SOLN
4.0000 mg | Freq: Once | INTRAMUSCULAR | Status: AC
  Administered 2022-11-09: 4 mg via INTRAVENOUS
  Filled 2022-11-09: qty 2

## 2022-11-09 MED ORDER — SODIUM CHLORIDE 0.9 % IV BOLUS
500.0000 mL | Freq: Once | INTRAVENOUS | Status: AC
  Administered 2022-11-09: 500 mL via INTRAVENOUS

## 2022-11-09 MED ORDER — DEXTROSE 50 % IV SOLN
50.0000 mL | Freq: Once | INTRAVENOUS | Status: AC
  Administered 2022-11-09: 50 mL via INTRAVENOUS

## 2022-11-09 MED ORDER — DEXTROSE 50 % IV SOLN
INTRAVENOUS | Status: AC
  Administered 2022-11-09: 50 mL via INTRAVENOUS
  Filled 2022-11-09: qty 50

## 2022-11-09 NOTE — Discharge Instructions (Signed)
Continue medications as previously prescribed.  Be sure to take an nourishment after you administer your insulin.  Return to the ER if you develop any new and/or concerning issues.

## 2022-11-09 NOTE — ED Notes (Signed)
Pt vomited x1.  

## 2022-11-12 ENCOUNTER — Other Ambulatory Visit: Payer: Self-pay

## 2022-11-12 DIAGNOSIS — R809 Proteinuria, unspecified: Secondary | ICD-10-CM

## 2022-11-12 DIAGNOSIS — I10 Essential (primary) hypertension: Secondary | ICD-10-CM

## 2022-11-12 DIAGNOSIS — Z794 Long term (current) use of insulin: Secondary | ICD-10-CM

## 2022-11-12 NOTE — Telephone Encounter (Signed)
Patient informed , [facility] lab orders placed at the front desk for pick up .

## 2022-11-14 ENCOUNTER — Other Ambulatory Visit: Payer: Medicaid Other | Admitting: Pharmacist

## 2022-11-21 ENCOUNTER — Ambulatory Visit: Payer: Medicaid Other | Admitting: Orthopaedic Surgery

## 2022-11-21 ENCOUNTER — Encounter: Payer: Self-pay | Admitting: Orthopaedic Surgery

## 2022-11-21 VITALS — BP 173/91 | HR 98 | Ht 66.0 in | Wt 175.0 lb

## 2022-11-21 DIAGNOSIS — M654 Radial styloid tenosynovitis [de Quervain]: Secondary | ICD-10-CM | POA: Diagnosis not present

## 2022-11-21 NOTE — Progress Notes (Signed)
I am better.  Her Anne Carney is improved on the left wrist.  She has developed a small cyst in the first compartment just proximal to the distal radius. She is using her hand more and has little pain.  She has negative Lourena Simmonds sign on the left wrist first compartment.  She has a small cyst just proximal to the distal radius in the first compartment size of a small grape seed.  NV intact.  ROM is full.  Encounter Diagnosis  Name Primary?   De Quervain's tenosynovitis, left Yes   I told her to just watch the cyst for now.  It may go away on its own.  It might get bigger and need to be excised.  She is improved.  I will see her in one month.  Call and cancel if doing well.  Call if any problem.  Precautions discussed.  Electronically Signed Darreld Mclean, MD 6/12/20248:16 AM

## 2022-11-23 ENCOUNTER — Other Ambulatory Visit: Payer: Self-pay

## 2022-11-23 ENCOUNTER — Emergency Department (HOSPITAL_COMMUNITY)
Admission: EM | Admit: 2022-11-23 | Discharge: 2022-11-24 | Disposition: A | Discharge status: Home / Self Care | Payer: Medicaid Other | Attending: Emergency Medicine | Admitting: Emergency Medicine

## 2022-11-23 ENCOUNTER — Encounter (HOSPITAL_COMMUNITY): Payer: Self-pay | Admitting: Emergency Medicine

## 2022-11-23 DIAGNOSIS — Z79899 Other long term (current) drug therapy: Secondary | ICD-10-CM | POA: Diagnosis not present

## 2022-11-23 DIAGNOSIS — I10 Essential (primary) hypertension: Secondary | ICD-10-CM | POA: Diagnosis not present

## 2022-11-23 DIAGNOSIS — R739 Hyperglycemia, unspecified: Secondary | ICD-10-CM

## 2022-11-23 DIAGNOSIS — R1110 Vomiting, unspecified: Secondary | ICD-10-CM | POA: Diagnosis present

## 2022-11-23 DIAGNOSIS — E860 Dehydration: Secondary | ICD-10-CM | POA: Diagnosis not present

## 2022-11-23 DIAGNOSIS — E1165 Type 2 diabetes mellitus with hyperglycemia: Secondary | ICD-10-CM | POA: Insufficient documentation

## 2022-11-23 DIAGNOSIS — E119 Type 2 diabetes mellitus without complications: Secondary | ICD-10-CM | POA: Diagnosis not present

## 2022-11-23 NOTE — ED Triage Notes (Signed)
Pt states she thinks she has food poisoning from something she ate yesterday. States she has been able to keep liquids down today but is still nausea. Pt also c/o lower abdominal pain she describes as "cramping".

## 2022-11-24 LAB — CBC
HCT: 35.5 % — ABNORMAL LOW (ref 36.0–46.0)
Hemoglobin: 12.2 g/dL (ref 12.0–15.0)
MCH: 30.2 pg (ref 26.0–34.0)
MCHC: 34.4 g/dL (ref 30.0–36.0)
MCV: 87.9 fL (ref 80.0–100.0)
Platelets: 165 10*3/uL (ref 150–400)
RBC: 4.04 MIL/uL (ref 3.87–5.11)
RDW: 13.3 % (ref 11.5–15.5)
WBC: 9.2 10*3/uL (ref 4.0–10.5)
nRBC: 0.2 % (ref 0.0–0.2)

## 2022-11-24 LAB — LIPASE, BLOOD: Lipase: 42 U/L (ref 11–51)

## 2022-11-24 LAB — URINALYSIS, ROUTINE W REFLEX MICROSCOPIC
Bilirubin Urine: NEGATIVE
Glucose, UA: >=500 mg/dL — AB
Ketones, ur: 20 mg/dL — AB
Leukocytes,Ua: NEGATIVE
Nitrite: NEGATIVE
Protein, ur: 100 mg/dL — AB
Specific Gravity, Urine: 1.018 (ref 1.005–1.030)
pH: 6 (ref 5.0–8.0)

## 2022-11-24 LAB — PREGNANCY, URINE: Preg Test, Ur: NEGATIVE

## 2022-11-24 LAB — COMPREHENSIVE METABOLIC PANEL
ALT: 26 U/L (ref 0–44)
AST: 17 U/L (ref 15–41)
Albumin: 3.3 g/dL — ABNORMAL LOW (ref 3.5–5.0)
Alkaline Phosphatase: 389 U/L — ABNORMAL HIGH (ref 38–126)
Anion gap: 11 (ref 5–15)
BUN: 25 mg/dL — ABNORMAL HIGH (ref 6–20)
CO2: 25 mmol/L (ref 22–32)
Calcium: 9.1 mg/dL (ref 8.9–10.3)
Chloride: 97 mmol/L — ABNORMAL LOW (ref 98–111)
Creatinine, Ser: 1.01 mg/dL — ABNORMAL HIGH (ref 0.44–1.00)
GFR, Estimated: >60 mL/min (ref >60)
Glucose, Bld: 420 mg/dL — ABNORMAL HIGH (ref 70–99)
Potassium: 4.3 mmol/L (ref 3.5–5.1)
Sodium: 133 mmol/L — ABNORMAL LOW (ref 135–145)
Total Bilirubin: 0.9 mg/dL (ref 0.3–1.2)
Total Protein: 7 g/dL (ref 6.5–8.1)

## 2022-11-24 LAB — CBG MONITORING, ED: Glucose-Capillary: 330 mg/dL — ABNORMAL HIGH (ref 70–99)

## 2022-11-24 MED ORDER — SODIUM CHLORIDE 0.9 % IV BOLUS
2000.0000 mL | Freq: Once | INTRAVENOUS | Status: AC
  Administered 2022-11-24: 2000 mL via INTRAVENOUS

## 2022-11-24 MED ORDER — ONDANSETRON 8 MG PO TBDP
8.0000 mg | ORAL_TABLET | Freq: Once | ORAL | Status: AC
  Administered 2022-11-24: 8 mg via ORAL
  Filled 2022-11-24: qty 1

## 2022-11-24 NOTE — ED Provider Notes (Signed)
[place] EMERGENCY DEPARTMENT AT [facility] Provider Note   CSN: [id number] Arrival date & time: 11/23/22  2324     History  Chief Complaint  Patient presents with   Nausea    Anne Carney is a 47 y.o. female.  The history is provided by the patient and a relative.   Patient with history of hypertension diabetes presents with suspected food poisoning.  Patient reports on June 13 she ate at [facility], and soon after she had multiple episodes of nonbloody emesis.  She also has had some episodes of loose stool that has improved.  She has had some mild abdominal cramping.  Over the past 24 hours she has had decreased appetite and fatigue.  She reports persistent nausea.  No chest pain or shortness of breath.  No fever.   Past Medical History:  Diagnosis Date   Diabetes mellitus    Hypertension    Kidney disease    Neuropathy     Home Medications Prior to Admission medications   Medication Sig Start Date End Date Taking? Authorizing Provider  blood glucose meter kit and supplies KIT Dispense based on patient and insurance preference. Use to check sugar up ro four times daily as directed. 10/24/20   Vassie Loll, MD  Continuous Glucose Sensor (FREESTYLE LIBRE 3 SENSOR) MISC Apply every 2 weeks. Use continuously to check blood sugar. 10/18/22   [name], DO  diclofenac Sodium (VOLTAREN) 1 % GEL Apply 2 g topically 4 (four) times daily. 10/26/22   Rodriguez-Southworth, Nettie Elm, PA-C  furosemide (LASIX) 20 MG tablet Take 20 mg by mouth daily as needed. 09/28/20   [provider]  gabapentin (NEURONTIN) 100 MG capsule Take 1 capsule (100 mg total) by mouth 2 (two) times daily. Patient taking differently: Take 200 mg by mouth 2 (two) times daily. Take 2 100 mg tablets twice daily 10/24/20   Vassie Loll, MD  Iron, Ferrous Sulfate, 325 (65 Fe) MG TABS Take 325 mg by mouth every other day. 10/11/22   [name], DO  LEVEMIR FLEXPEN 100 UNIT/ML FlexPen SMARTSIG:38  Unit(s) SUB-Q Twice Daily 07/11/22   [provider]  lisinopril-hydrochlorothiazide (ZESTORETIC) 10-12.5 MG tablet Take 1 tablet by mouth daily. 10/05/22   [name], DO  meclizine (ANTIVERT) 25 MG tablet Take 1 tablet (25 mg total) by mouth every 8 (eight) hours as needed for dizziness. 10/24/20   Vassie Loll, MD  ondansetron (ZOFRAN) 4 MG tablet Take 1 tablet (4 mg total) by mouth every 4 (four) hours as needed for nausea or vomiting. 07/29/21   Tanda Rockers A, DO  rosuvastatin (CRESTOR) 20 MG tablet Take 1 tablet (20 mg total) by mouth daily. 10/11/22   [name], DO      Allergies    Patient has no known allergies.    Review of Systems   Review of Systems  Constitutional:  Positive for fatigue.  Cardiovascular:  Negative for chest pain.    Physical Exam Updated Vital Signs BP (!) 163/86   Pulse 89   Temp 98.5 F (36.9 C) (Oral)   Resp 18   Ht 1.676 m (5\' 6" )   Wt 79 kg   LMP 10/25/2022 (Approximate)   SpO2 98%   BMI 28.11 kg/m  Physical Exam CONSTITUTIONAL: Well developed/well nourished HEAD: Normocephalic/atraumatic EYES: EOMI/PERRL, no icterus ENMT: Mucous membranes moist NECK: supple no meningeal signs SPINE/BACK:entire spine nontender CV: S1/S2 noted, no murmurs/rubs/gallops noted LUNGS: Lungs are clear to auscultation bilaterally,  no apparent distress ABDOMEN: soft, nontender, no rebound or guarding, bowel sounds noted throughout abdomen GU:no cva tenderness NEURO: Pt is awake/alert/appropriate, moves all extremitiesx4.  No facial droop.   EXTREMITIES: pulses normal/equal, full ROM SKIN: warm, color normal PSYCH: no abnormalities of mood noted, alert and oriented to situation  ED Results / Procedures / Treatments   Labs (all labs ordered are listed, but only abnormal results are displayed) Labs Reviewed  COMPREHENSIVE METABOLIC PANEL - Abnormal; Notable for the following components:      Result Value   Sodium 133 (*)    Chloride 97 (*)     Glucose, Bld 420 (*)    BUN 25 (*)    Creatinine, Ser 1.01 (*)    Albumin 3.3 (*)    Alkaline Phosphatase 389 (*)    All other components within normal limits  CBC - Abnormal; Notable for the following components:   HCT 35.5 (*)    All other components within normal limits  URINALYSIS, ROUTINE W REFLEX MICROSCOPIC - Abnormal; Notable for the following components:   APPearance HAZY (*)    Glucose, UA >=500 (*)    Hgb urine dipstick SMALL (*)    Ketones, ur 20 (*)    Protein, ur 100 (*)    Bacteria, UA RARE (*)    All other components within normal limits  CBG MONITORING, ED - Abnormal; Notable for the following components:   Glucose-Capillary 330 (*)    All other components within normal limits  LIPASE, BLOOD  PREGNANCY, URINE    EKG EKG Interpretation  Date/Time:  Saturday November 24 2022 00:40:23 EDT Ventricular Rate:  93 PR Interval:  133 QRS Duration: 89 QT Interval:  366 QTC Calculation: 456 R Axis:   -7 Text Interpretation: Sinus rhythm Low voltage, extremity leads Anteroseptal infarct, old Baseline wander in lead(s) I No significant change since last tracing Confirmed by Zadie Rhine ([id number]) on 11/24/2022 12:45:02 AM  Radiology No results found.  Procedures Procedures    Medications Ordered in ED Medications  ondansetron (ZOFRAN-ODT) disintegrating tablet 8 mg (8 mg Oral Given 11/24/22 0036)  sodium chloride 0.9 % bolus 2,000 mL (0 mLs Intravenous Stopped 11/24/22 0345)    ED Course/ Medical Decision Making/ A&P Clinical Course as of 11/24/22 0348  Sat Nov 24, 2022  0056 Glucose(!): 420 Hyperglycemia [DW]  0347 Patient reports feeling that she had food poisoning from [place], then continue to have nausea and lack of appetite.  Labs revealed hyperglycemia without DKA.  She was given IV fluids and improved.  She is requesting discharge home.  She has no new abdominal pain.  Plan to discharge home.  I encouraged her to continue her diabetic medications [DW]     Clinical Course User Index [DW] Zadie Rhine, MD                             Medical Decision Making Amount and/or Complexity of Data Reviewed Labs: ordered. Decision-making details documented in ED Course. ECG/medicine tests: ordered.  Risk Prescription drug management.   This patient presents to the ED for concern of vomiting, this involves an extensive number of treatment options, and is a complaint that carries with it a high risk of complications and morbidity.  The differential diagnosis includes but is not limited to gastritis, acute coronary syndrome, bowel obstruction, pneumonia, diabetic ketoacidosis, HHS  Comorbidities that complicate the patient evaluation: Patient's presentation is complicated by their history of  diabetes and hypertension  Social Determinants of Health: Patient's  multiple ER visits   increases the complexity of managing their presentation  Additional history obtained: Records reviewed Primary Care Documents  Lab Tests: I Ordered, and personally interpreted labs.  The pertinent results include: Hyperglycemia without anion gap   Medicines ordered and prescription drug management: I ordered medication including IV fluids for dehydration Reevaluation of the patient after these medicines showed that the patient    improved    Critical Interventions:   IV fluids  Reevaluation: After the interventions noted above, I reevaluated the patient and found that they have :improved  Complexity of problems addressed: Patient's presentation is most consistent with  acute presentation with potential threat to life or bodily function  Disposition: After consideration of the diagnostic results and the patient's response to treatment,  I feel that the patent would benefit from discharge   .           Final Clinical Impression(s) / ED Diagnoses Final diagnoses:  Dehydration  Hyperglycemia    Rx / DC Orders ED Discharge Orders     None          Zadie Rhine, MD 11/24/22 [phone]

## 2022-11-27 ENCOUNTER — Telehealth: Payer: Self-pay | Admitting: *Deleted

## 2022-11-27 ENCOUNTER — Ambulatory Visit (INDEPENDENT_AMBULATORY_CARE_PROVIDER_SITE_OTHER): Payer: Medicaid Other | Admitting: Family Medicine

## 2022-11-27 VITALS — BP 122/74 | HR 99 | Temp 97.9°F | Ht 66.0 in | Wt 163.0 lb

## 2022-11-27 DIAGNOSIS — R809 Proteinuria, unspecified: Secondary | ICD-10-CM

## 2022-11-27 DIAGNOSIS — E1129 Type 2 diabetes mellitus with other diabetic kidney complication: Secondary | ICD-10-CM | POA: Diagnosis not present

## 2022-11-27 DIAGNOSIS — Z794 Long term (current) use of insulin: Secondary | ICD-10-CM

## 2022-11-27 MED ORDER — NOVOLOG FLEXPEN 100 UNIT/ML ~~LOC~~ SOPN: 6.0000 [IU] | PEN_INJECTOR | Freq: Three times a day (TID) | SUBCUTANEOUS | 11 refills | Status: DC

## 2022-11-27 MED ORDER — INSULIN PEN NEEDLE 32G X 4 MM MISC: 3 refills | Status: DC

## 2022-11-27 MED ORDER — INSULIN GLARGINE 100 UNITS/ML SOLOSTAR PEN: 35.0000 [IU] | PEN_INJECTOR | Freq: Two times a day (BID) | SUBCUTANEOUS | 6 refills | Status: DC

## 2022-11-27 NOTE — Patient Instructions (Signed)
Medication as prescribed. ? ?Follow up in ~ 1 month. ? ?Take care ? ?Dr. Trestan Vahle  ?

## 2022-11-27 NOTE — Telephone Encounter (Signed)
PA for [employer] 3 sensor denied by insurance

## 2022-11-28 NOTE — Progress Notes (Signed)
Subjective:  Patient ID: Anne Carney, female    DOB: Dec 02, 1975  Age: 47 y.o. MRN: [id number]  CC: Chief Complaint  Patient presents with   Diabetes    HPI:  47 year old female presents for follow-up.  Patient has uncontrolled type 2 diabetes.  Most recent A1c 13.1.  I am concerned about noncompliance.  Patient has been using Lantus which was given to her by relative.  She has recently had hypoglycemia which prompted an ER visit.  Subsequently had hyperglycemia and dehydration and was seen in the ER on 6/14.  Patient is just gotten her freestyle libre.  She has it in place but does not yet have it configured to the app on her phone.  Patient states that she has been compliant with insulin taking 38 units twice daily.  Patient Active Problem List   Diagnosis Date Noted   Proteinuria 10/05/2022   Hyperlipidemia 10/05/2022   Elevated alkaline phosphatase level 01/26/2022   Elevated LFTs 11/28/2021   Gastroesophageal reflux disease    Primary hypertension    DM type 2 (diabetes mellitus, type 2) (HCC) 11/21/2015    Social Hx   Social History   Socioeconomic History   Marital status: Divorced    Spouse name: Not on file   Number of children: Not on file   Years of education: Not on file   Highest education level: Not on file  Occupational History   Not on file  Tobacco Use   Smoking status: Never   Smokeless tobacco: Never  Substance and Sexual Activity   Alcohol use: Yes    Comment: occ.   Drug use: Not Currently    Types: Marijuana    Comment: occasionally   Sexual activity: Yes    Birth control/protection: None  Other Topics Concern   Not on file  Social History Narrative   Not on file   Social Determinants of Health   Financial Resource Strain: Not on file  Food Insecurity: Not on file  Transportation Needs: Not on file  Physical Activity: Not on file  Stress: Not on file  Social Connections: Not on file    Review of Systems  Cardiovascular:   Positive for leg swelling.   Objective:  BP 122/74   Pulse 99   Temp 97.9 F (36.6 C)   Ht 5\' 6"  (1.676 m)   Wt 163 lb (73.9 kg)   LMP 10/25/2022 (Approximate)   SpO2 100%   BMI 26.31 kg/m      11/27/2022    3:50 PM 11/24/2022    3:45 AM 11/24/2022    1:00 AM  BP/Weight  Systolic BP 122 151 163  Diastolic BP 74 84 86  Wt. (Lbs) 163    BMI 26.31 kg/m2      Physical Exam Vitals and nursing note reviewed.  Constitutional:      General: She is not in acute distress.    Appearance: Normal appearance.  HENT:     Head: Normocephalic and atraumatic.  Eyes:     General:        Right eye: No discharge.        Left eye: No discharge.     Conjunctiva/sclera: Conjunctivae normal.  Cardiovascular:     Rate and Rhythm: Normal rate and regular rhythm.  Pulmonary:     Effort: Pulmonary effort is normal.     Breath sounds: Normal breath sounds. No wheezing, rhonchi or rales.  Neurological:     Mental Status: She is alert.  Psychiatric:  Mood and Affect: Mood normal.        Behavior: Behavior normal.    Lab Results  Component Value Date   WBC 9.2 11/24/2022   HGB 12.2 11/24/2022   HCT 35.5 (L) 11/24/2022   PLT 165 11/24/2022   GLUCOSE 420 (H) 11/24/2022   CHOL 210 (H) 10/04/2022   TRIG 49 10/04/2022   HDL 88 10/04/2022   LDLCALC 108 (H) 10/04/2022   ALT 26 11/24/2022   AST 17 11/24/2022   NA 133 (L) 11/24/2022   K 4.3 11/24/2022   CL 97 (L) 11/24/2022   CREATININE 1.01 (H) 11/24/2022   BUN 25 (H) 11/24/2022   CO2 25 11/24/2022   HGBA1C 13.1 (H) 10/04/2022   MICROALBUR 17.2 10/04/2022     Assessment & Plan:   Problem List Items Addressed This Visit       Endocrine   DM type 2 (diabetes mellitus, type 2) (HCC) - Primary    Diabetes is highly uncontrolled.  Blood sugar was obtained today and was "high". I am concerned about noncompliance.  I have sent in insulin for her both long-acting and short acting.  She has an appointment with endocrinology  although this is not until August.  I will see if we can move this up.      Relevant Medications   insulin glargine (LANTUS) 100 unit/mL SOPN   insulin aspart (NOVOLOG FLEXPEN) 100 UNIT/ML FlexPen    Meds ordered this encounter  Medications   insulin glargine (LANTUS) 100 unit/mL SOPN    Sig: Inject 35 Units into the skin 2 (two) times daily.    Dispense:  30 mL    Refill:  6   insulin aspart (NOVOLOG FLEXPEN) 100 UNIT/ML FlexPen    Sig: Inject 6 Units into the skin 3 (three) times daily with meals.    Dispense:  15 mL    Refill:  11   Insulin Pen Needle 32G X 4 MM MISC    Sig: Use up to 5 times daily to administer insulin.    Dispense:  200 each    Refill:  3    Follow-up:  Return in about 1 month (around 12/27/2022).  Everlene Other DO [facility]

## 2022-11-28 NOTE — Telephone Encounter (Signed)
Patient was able to get this through endocrinology- Patient seen by Dr Adriana Simas in office 11/27/22

## 2022-11-28 NOTE — Assessment & Plan Note (Signed)
Diabetes is highly uncontrolled.  Blood sugar was obtained today and was "high". I am concerned about noncompliance.  I have sent in insulin for her both long-acting and short acting.  She has an appointment with endocrinology although this is not until August.  I will see if we can move this up.

## 2022-12-04 ENCOUNTER — Encounter: Payer: Self-pay | Admitting: *Deleted

## 2022-12-04 NOTE — Progress Notes (Signed)
11/14/2022 Name: Kimaya Whitlatch MRN: [id number] DOB: 1976/02/23  Chief Complaint  Patient presents with   Diabetes    Nikoleta Dady is a 47 y.o. year old female who presented for a telephone visit.   They were referred to the pharmacist by their PCP for assistance in managing diabetes.    Subjective:  Medication Access/Adherence  Current Pharmacy:  [id number] - [place], [place] - [address] [place] [address] Phone: [phone] Fax: [phone]   Diabetes:   Current medications: Levemir BID-->lantus/novolog  Medications tried in the past:  Did not tolerate metformin, janumet, glipizide   Current glucose readings: 200+; not compliant with checking   -Patient is testing blood sugar 6 times daily -Patient is injecting insulin 4 or more times daily -She would greatly benefit from a continuous glucose monitoring system (I.e. libre or dexcom)   Patient denies hypoglycemic s/sx including dizziness, shakiness, sweating. Patient reports hyperglycemic symptoms including polyuria, polydipsia, polyphagia, nocturia, neuropathy, blurred vision.   Current physical activity: encouraged as able   Current medication access support: medicaid      Objective:  Lab Results  Component Value Date   HGBA1C 13.1 (H) 10/04/2022    Lab Results  Component Value Date   CREATININE 1.01 (H) 11/24/2022   BUN 25 (H) 11/24/2022   NA 133 (L) 11/24/2022   K 4.3 11/24/2022   CL 97 (L) 11/24/2022   CO2 25 11/24/2022    Lab Results  Component Value Date   CHOL 210 (H) 10/04/2022   HDL 88 10/04/2022   LDLCALC 108 (H) 10/04/2022   TRIG 49 10/04/2022   CHOLHDL 2.4 10/04/2022    Medications Reviewed Today     Reviewed by Danella Maiers, [facility] (Pharmacist) on 11/23/22 at 1103  Med List Status: <None>   Medication Order Taking? Sig Documenting Provider Last Dose Status Informant  blood glucose meter kit and supplies KIT [id number] No Dispense  based on patient and insurance preference. Use to check sugar up ro four times daily as directed. Vassie Loll, MD Taking Active   Continuous Glucose Sensor (FREESTYLE LIBRE 3 SENSOR) [place] [id number] No Apply every 2 weeks. Use continuously to check blood sugar. [name], DO Taking Active   diclofenac Sodium (VOLTAREN) 1 % GEL [id number] No Apply 2 g topically 4 (four) times daily. Rodriguez-Southworth, Nettie Elm, PA-C Taking Active   furosemide (LASIX) 20 MG tablet [id number] No Take 20 mg by mouth daily as needed. [provider] Taking Active Self  gabapentin (NEURONTIN) 100 MG capsule [id number] No Take 1 capsule (100 mg total) by mouth 2 (two) times daily.  Patient taking differently: Take 200 mg by mouth 2 (two) times daily. Take 2 100 mg tablets twice daily   Vassie Loll, MD Taking Active   Iron, Ferrous Sulfate, 325 (65 Fe) MG TABS [id number] No Take 325 mg by mouth every other day. Everlene Other G, DO Taking Active   LEVEMIR FLEXPEN 100 UNIT/ML FlexPen [id number] No SMARTSIG:38 Unit(s) SUB-Q Twice Daily [provider] Taking Active   lisinopril-hydrochlorothiazide (ZESTORETIC) 10-12.5 MG tablet [id number] No Take 1 tablet by mouth daily. [name], DO Taking Active   meclizine (ANTIVERT) 25 MG tablet [id number] No Take 1 tablet (25 mg total) by mouth every 8 (eight) hours as needed for dizziness. Vassie Loll, MD Taking Active   methylPREDNISolone acetate (DEPO-MEDROL) injection 40 mg [id number]   Darreld Mclean, MD  Active   ondansetron [facility]) 4 MG tablet [id number] No  Take 1 tablet (4 mg total) by mouth every 4 (four) hours as needed for nausea or vomiting. Sloan Leiter, DO Taking Active   rosuvastatin (CRESTOR) 20 MG tablet [id number] No Take 1 tablet (20 mg total) by mouth daily. [name], DO Taking Active               Assessment/Plan:  Diabetes: - Currently uncontrolled - Reviewed long term cardiovascular and renal outcomes of uncontrolled blood  sugar - Reviewed goal A1c, goal fasting, and goal 2 hour post prandial glucose - Reviewed dietary modifications including healthy plate method - Reviewed lifestyle modifications including:increase water intake, increase exercise as able - Recommend to: Start Libre 3 CGM --PA sent to [facility] (pending)   **update approved & filled at the pharmacy   pt to use phone as reader [id number] Approved 11/2022-06/11/23 Change to Lantus/Tresiba u-200 pens (pt currently using Levemir vials from uncle) Consider adding GLP1 at f/u next week at face to face appt Consider adding SGTL2--GFR 64 when blood sugar improves - Recommend to check glucose using CGM continuously  Follow Up Plan: PCP, then PharmD in 1 month  Kieth Brightly, PharmD, BCACP Clinical Pharmacist, [facility]

## 2022-12-04 NOTE — Telephone Encounter (Signed)
I spent 3 weeks on the phone with insurance for this one! I got this approved by calling [employer] (Medicaid) & completing appeal  Libre 3 CGM sensors (pt to use phone as reader) [id number] Approved 11/23/2022-06/11/23 Patient filled on 11/27/22 at [facility]

## 2022-12-05 ENCOUNTER — Ambulatory Visit: Payer: Medicaid Other

## 2022-12-06 DIAGNOSIS — Z0120 Encounter for dental examination and cleaning without abnormal findings: Secondary | ICD-10-CM | POA: Diagnosis not present

## 2022-12-07 ENCOUNTER — Telehealth: Payer: Self-pay

## 2022-12-07 NOTE — Telephone Encounter (Signed)
Cook, Raywick G, DO     I would decrease the dose of her lantus to 30 twice daily.

## 2022-12-07 NOTE — Telephone Encounter (Signed)
Pt is calling wanting Dr Adriana Simas nurse to call back her sugar keeps dropping in the evening times real low and wants to know what to do?  [place] [phone]

## 2022-12-07 NOTE — Telephone Encounter (Signed)
Patient notified of provider's recommendations and verbalized understanding.  

## 2022-12-19 ENCOUNTER — Ambulatory Visit: Payer: Medicaid Other | Admitting: Orthopaedic Surgery

## 2022-12-27 ENCOUNTER — Encounter: Payer: Self-pay | Admitting: Family Medicine

## 2022-12-27 ENCOUNTER — Ambulatory Visit: Payer: Medicaid Other | Admitting: Family Medicine

## 2022-12-27 VITALS — BP 124/82 | Temp 98.2°F | Ht 66.0 in | Wt 178.0 lb

## 2022-12-27 DIAGNOSIS — Z794 Long term (current) use of insulin: Secondary | ICD-10-CM | POA: Diagnosis not present

## 2022-12-27 DIAGNOSIS — R809 Proteinuria, unspecified: Secondary | ICD-10-CM | POA: Diagnosis not present

## 2022-12-27 DIAGNOSIS — I10 Essential (primary) hypertension: Secondary | ICD-10-CM

## 2022-12-27 DIAGNOSIS — E1129 Type 2 diabetes mellitus with other diabetic kidney complication: Secondary | ICD-10-CM | POA: Diagnosis not present

## 2022-12-27 MED ORDER — SEMAGLUTIDE(0.25 OR 0.5MG/DOS) 2 MG/3ML ~~LOC~~ SOPN: PEN_INJECTOR | SUBCUTANEOUS | 0 refills | Status: DC

## 2022-12-27 NOTE — Patient Instructions (Addendum)
Get labs drawn today.  I am going to send in Ozempic.   We will call with the results.  Call with issues concerns.

## 2022-12-28 DIAGNOSIS — M86672 Other chronic osteomyelitis, left ankle and foot: Secondary | ICD-10-CM | POA: Insufficient documentation

## 2022-12-28 DIAGNOSIS — Z89422 Acquired absence of other left toe(s): Secondary | ICD-10-CM | POA: Insufficient documentation

## 2022-12-28 NOTE — Progress Notes (Signed)
Subjective:  Patient ID: Anne Carney, female    DOB: 10-18-1975  Age: 47 y.o. MRN: [id number]  CC: Chief Complaint  Patient presents with   Diabetes    Has low blood sugar readings around 2 to 3 am 50'S on CGM 250 's after most meals, patient states does not eat meals consistently - depending on how she feels    LLQ ABD PAIN     With some constipation    HPI:  47 year old female with uncontrolled Diabetes presents for follow up.  Has had 4 low blood sugars in the past 1 month. Seem to happen mostly in the early morning. She has been taking Lantus 30 units twice daily (supposed to be 35 BID).  Has had prior ED visit for hypoglycemia. She states that she is not taking the Novolog.  I reviewed her CGM via her phone today. ~49% of her readings are >250.   HTN stable on Lisinopril/hydrochlorothiazide.   She reports some recent pain of the flank (left lateral upper ribs).  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) 12/28/2022   Proteinuria 10/05/2022   Hyperlipidemia 10/05/2022   Elevated alkaline phosphatase level 01/26/2022   Elevated LFTs 11/28/2021   Gastroesophageal reflux disease    Primary hypertension    DM type 2 (diabetes mellitus, type 2) (HCC) 11/21/2015    Social Hx   Social History   Socioeconomic History   Marital status: Divorced    Spouse name: Not on file   Number of children: Not on file   Years of education: Not on file   Highest education level: Not on file  Occupational History   Not on file  Tobacco Use   Smoking status: Never   Smokeless tobacco: Never  Substance and Sexual Activity   Alcohol use: Yes    Comment: occ.   Drug use: Not Currently    Types: Marijuana    Comment: occasionally   Sexual activity: Yes    Birth control/protection: None  Other Topics Concern   Not on file  Social History Narrative   Not on file   Social Determinants of Health   Financial  Resource Strain: Not on file  Food Insecurity: Not on file  Transportation Needs: Not on file  Physical Activity: Not on file  Stress: Not on file  Social Connections: Not on file    Review of Systems Per HPI  Objective:  BP 124/82   Temp 98.2 F (36.8 C)   Ht 5\' 6"  (1.676 m)   Wt 178 lb (80.7 kg)   SpO2 100%   BMI 28.73 kg/m      12/27/2022    3:09 PM 11/27/2022    3:50 PM 11/24/2022    3:45 AM  BP/Weight  Systolic BP 124 122 151  Diastolic BP 82 74 84  Wt. (Lbs) 178 163   BMI 28.73 kg/m2 26.31 kg/m2     Physical Exam Constitutional:      General: She is not in acute distress. HENT:     Head: Normocephalic and atraumatic.  Eyes:     General:        Right eye: No discharge.        Left eye: No discharge.     Conjunctiva/sclera: Conjunctivae normal.  Cardiovascular:     Rate and Rhythm: Normal rate and regular rhythm.  Pulmonary:     Effort: Pulmonary effort is normal.  Breath sounds: Normal breath sounds.  Abdominal:     General: There is no distension.     Palpations: Abdomen is soft.     Tenderness: There is no abdominal tenderness.  Neurological:     Mental Status: She is alert.     Lab Results  Component Value Date   WBC 9.2 11/24/2022   HGB 12.2 11/24/2022   HCT 35.5 (L) 11/24/2022   PLT 165 11/24/2022   GLUCOSE 420 (H) 11/24/2022   CHOL 210 (H) 10/04/2022   TRIG 49 10/04/2022   HDL 88 10/04/2022   LDLCALC 108 (H) 10/04/2022   ALT 26 11/24/2022   AST 17 11/24/2022   NA 133 (L) 11/24/2022   K 4.3 11/24/2022   CL 97 (L) 11/24/2022   CREATININE 1.01 (H) 11/24/2022   BUN 25 (H) 11/24/2022   CO2 25 11/24/2022   HGBA1C 13.1 (H) 10/04/2022   MICROALBUR 17.2 10/04/2022     Assessment & Plan:   Problem List Items Addressed This Visit       Cardiovascular and Mediastinum   Primary hypertension    Stable. Continue Lisinopril/hydrochlorothiazide.        Endocrine   DM type 2 (diabetes mellitus, type 2) (HCC) - Primary    Continue  lantus. 35 units BID. Labs today - C peptide and GAD. Adding Ozempic.  Hopefully GLP1 will help with postprandial sugars and help avoid hypoglycemia. Appt with endo in August.       Relevant Medications   Semaglutide,0.25 or 0.5MG /DOS, 2 MG/3ML SOPN   Other Relevant Orders   C-peptide   Glutamic acid decarboxylase auto abs    Meds ordered this encounter  Medications   Semaglutide,0.25 or 0.5MG /DOS, 2 MG/3ML SOPN    Sig: 0.25 mg once weekly for 4 weeks, then increase to 0.5 mg once weekly    Dispense:  3 mL    Refill:  0    Follow-up:  Return in about 1 month (around 01/27/2023).  Everlene Other DO [facility]

## 2022-12-28 NOTE — Assessment & Plan Note (Signed)
Stable. Continue Lisinopril/hydrochlorothiazide.

## 2022-12-28 NOTE — Assessment & Plan Note (Signed)
Continue lantus. 35 units BID. Labs today - C peptide and GAD. Adding Ozempic.  Hopefully GLP1 will help with postprandial sugars and help avoid hypoglycemia. Appt with endo in August.

## 2023-01-02 ENCOUNTER — Ambulatory Visit: Payer: Medicaid Other | Admitting: Podiatry

## 2023-01-09 ENCOUNTER — Encounter: Payer: Self-pay | Admitting: Podiatry

## 2023-01-09 ENCOUNTER — Ambulatory Visit (INDEPENDENT_AMBULATORY_CARE_PROVIDER_SITE_OTHER): Payer: Medicaid Other | Admitting: Podiatry

## 2023-01-09 DIAGNOSIS — B351 Tinea unguium: Secondary | ICD-10-CM | POA: Diagnosis not present

## 2023-01-09 DIAGNOSIS — M79675 Pain in left toe(s): Secondary | ICD-10-CM

## 2023-01-09 DIAGNOSIS — M79674 Pain in right toe(s): Secondary | ICD-10-CM

## 2023-01-09 DIAGNOSIS — E1142 Type 2 diabetes mellitus with diabetic polyneuropathy: Secondary | ICD-10-CM

## 2023-01-09 NOTE — Progress Notes (Signed)
  Subjective:  Patient ID: Anne Carney, female    DOB: 1976/06/06,   MRN: [id number]  No chief complaint on file.   47 y.o. female presents for concern of thickened elongated and painful nails that are difficult to trim. Requesting to have them trimmed today. Relates burning and tingling in their feet. Patient is diabetic and last A1c was  Lab Results  Component Value Date   HGBA1C 13.1 (H) 10/04/2022   .   PCP:  [name], DO    . Denies any other pedal complaints. Denies n/v/f/c.   Past Medical History:  Diagnosis Date   Diabetes mellitus    Hypertension    Kidney disease    Neuropathy     Objective:  Physical Exam: Vascular: DP/PT pulses 2/4 bilateral. CFT <3 seconds. Normal hair growth on digits. No edema.  Skin. No lacerations or abrasions bilateral feet. Nails 1, 3-5 bilateral thickened and elongated with subungual debris. 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  Musculoskeletal: MMT 5/5 bilateral lower extremities in DF, PF, Inversion and Eversion. Deceased ROM in DF of ankle joint. Bilateral distal second digit amputations.  Neurological: Sensation intact to light touch.   Assessment:   1. Type 2 diabetes mellitus with diabetic polyneuropathy, unspecified whether long term insulin use (HCC)   2. Pain due to onychomycosis of toenails of both feet      Plan:  Patient was evaluated and treated and all questions answered. -Discussed and educated patient on diabetic foot care, especially with  regards to the vascular, neurological and musculoskeletal systems.  -Stressed the importance of good glycemic control and the detriment of not  controlling glucose levels in relation to the foot. -Discussed supportive shoes at all times and checking feet regularly.  -Mechanically debrided all nails 1-5 bilateral using sterile nail nipper and filed with dremel without incident  -Hyeprkeratotic tissue debrided as courtesy.  -Answered all patient  questions -Patient to return  as needed for nail care.  -Patient advised to call the office if any problems or questions arise in the meantime.   [name], DPM

## 2023-01-15 NOTE — Patient Instructions (Signed)

## 2023-01-16 ENCOUNTER — Encounter: Payer: Self-pay | Admitting: Nurse Practitioner

## 2023-01-16 ENCOUNTER — Ambulatory Visit: Payer: Medicaid Other | Admitting: Nurse Practitioner

## 2023-01-16 VITALS — BP 133/81 | HR 87 | Ht 66.0 in | Wt 174.4 lb

## 2023-01-16 DIAGNOSIS — E782 Mixed hyperlipidemia: Secondary | ICD-10-CM | POA: Diagnosis not present

## 2023-01-16 DIAGNOSIS — Z794 Long term (current) use of insulin: Secondary | ICD-10-CM | POA: Diagnosis not present

## 2023-01-16 DIAGNOSIS — Z7985 Long-term (current) use of injectable non-insulin antidiabetic drugs: Secondary | ICD-10-CM | POA: Diagnosis not present

## 2023-01-16 DIAGNOSIS — E11649 Type 2 diabetes mellitus with hypoglycemia without coma: Secondary | ICD-10-CM | POA: Diagnosis not present

## 2023-01-16 DIAGNOSIS — I10 Essential (primary) hypertension: Secondary | ICD-10-CM

## 2023-01-16 LAB — POCT GLYCOSYLATED HEMOGLOBIN (HGB A1C): Hemoglobin A1C: 8.7 % — AB (ref 4.0–5.6)

## 2023-01-16 MED ORDER — INSULIN GLARGINE 100 UNITS/ML SOLOSTAR PEN: 30.0000 [IU] | PEN_INJECTOR | Freq: Every day | SUBCUTANEOUS | 6 refills | Status: DC

## 2023-01-16 MED ORDER — NOVOLOG FLEXPEN 100 UNIT/ML ~~LOC~~ SOPN: 6.0000 [IU] | PEN_INJECTOR | Freq: Three times a day (TID) | SUBCUTANEOUS | 3 refills | Status: DC

## 2023-01-16 NOTE — Progress Notes (Signed)
Endocrinology Consult Note       01/16/2023, 4:39 PM   Subjective:    Patient ID: Anne Carney, female    DOB: 07/29/1975.  Anne Carney is being seen in consultation for management of currently uncontrolled symptomatic diabetes requested by  [name], DO.   Past Medical History:  Diagnosis Date   Diabetes mellitus    Hypertension    Kidney disease    Neuropathy     Past Surgical History:  Procedure Laterality Date   BIOPSY  09/25/2021   Procedure: BIOPSY;  Surgeon: Lanelle Bal, DO;  [facility]: [facility];  Service: Endoscopy;;   CESAREAN SECTION     COLONOSCOPY WITH PROPOFOL N/A 09/25/2021   Procedure: COLONOSCOPY WITH PROPOFOL;  Surgeon: Lanelle Bal, DO;  [facility]: [facility];  Service: Endoscopy;  Laterality: N/A;  9:30am, ASA 3   COLONOSCOPY WITH PROPOFOL N/A 04/24/2022   Procedure: COLONOSCOPY WITH PROPOFOL;  Surgeon: Lanelle Bal, DO;  [facility]: [facility];  Service: Endoscopy;  Laterality: N/A;  12:00pm, asa 2   ESOPHAGOGASTRODUODENOSCOPY (EGD) WITH PROPOFOL N/A 09/25/2021   Procedure: ESOPHAGOGASTRODUODENOSCOPY (EGD) WITH PROPOFOL;  Surgeon: Lanelle Bal, DO;  [facility]: [facility];  Service: Endoscopy;  Laterality: N/A;   POLYPECTOMY  04/24/2022   Procedure: POLYPECTOMY INTESTINAL;  Surgeon: Lanelle Bal, DO;  Location: [facility];  Service: Endoscopy;;   TOE SURGERY Right    TOE SURGERY Left     Social History   Socioeconomic History   Marital status: Divorced    Spouse name: Not on file   Number of children: Not on file   Years of education: Not on file   Highest education level: Not on file  Occupational History   Not on file  Tobacco Use   Smoking status: Never   Smokeless tobacco: Never  Substance and Sexual Activity   Alcohol use: Yes    Comment: occ.   Drug use: Not Currently    Types: Marijuana    Comment: occasionally   Sexual  activity: Yes    Birth control/protection: None  Other Topics Concern   Not on file  Social History Narrative   Not on file   Social Determinants of Health   Financial Resource Strain: Not on file  Food Insecurity: Not on file  Transportation Needs: Not on file  Physical Activity: Not on file  Stress: Not on file  Social Connections: Not on file    Family History  Problem Relation Age of Onset   Huntington's disease Father    Asthma Mother    Diabetes Daughter     Outpatient Encounter Medications as of 01/16/2023  Medication Sig   blood glucose meter kit and supplies KIT Dispense based on patient and insurance preference. Use to check sugar up ro four times daily as directed.   Continuous Glucose Sensor (FREESTYLE LIBRE 3 SENSOR) MISC Apply every 2 weeks. Use continuously to check blood sugar.   furosemide (LASIX) 20 MG tablet Take 20 mg by mouth daily as needed.   gabapentin (NEURONTIN) 100 MG capsule Take 1 capsule (100 mg total) by mouth 2 (two) times daily. (Patient taking differently: Take 200  mg by mouth 2 (two) times daily. Take 2 100 mg tablets twice daily)   Insulin Pen Needle 32G X 4 MM MISC Use up to 5 times daily to administer insulin.   Iron, Ferrous Sulfate, 325 (65 Fe) MG TABS Take 325 mg by mouth every other day.   lisinopril-hydrochlorothiazide (ZESTORETIC) 10-12.5 MG tablet Take 1 tablet by mouth daily.   meclizine (ANTIVERT) 25 MG tablet Take 1 tablet (25 mg total) by mouth every 8 (eight) hours as needed for dizziness.   ondansetron (ZOFRAN) 4 MG tablet Take 1 tablet (4 mg total) by mouth every 4 (four) hours as needed for nausea or vomiting.   rosuvastatin (CRESTOR) 20 MG tablet Take 1 tablet (20 mg total) by mouth daily.   [DISCONTINUED] insulin aspart (NOVOLOG FLEXPEN) 100 UNIT/ML FlexPen Inject 6 Units into the skin 3 (three) times daily with meals.   [DISCONTINUED] insulin glargine (LANTUS) 100 unit/mL SOPN Inject 35 Units into the skin 2 (two) times daily.    diclofenac Sodium (VOLTAREN) 1 % GEL Apply 2 g topically 4 (four) times daily. (Patient not taking: Reported on 01/16/2023)   insulin aspart (NOVOLOG FLEXPEN) 100 UNIT/ML FlexPen Inject 6-12 Units into the skin 3 (three) times daily with meals.   insulin glargine (LANTUS) 100 unit/mL SOPN Inject 30 Units into the skin at bedtime.   [DISCONTINUED] Semaglutide,0.25 or 0.5MG /DOS, 2 MG/3ML SOPN 0.25 mg once weekly for 4 weeks, then increase to 0.5 mg once weekly (Patient not taking: Reported on 01/16/2023)   Facility-Administered Encounter Medications as of 01/16/2023  Medication   methylPREDNISolone acetate (DEPO-MEDROL) injection 40 mg    ALLERGIES: No Known Allergies  VACCINATION STATUS: Immunization History  Administered Date(s) Administered   Influenza,inj,Quad PF,6+ Mos 04/02/2018   Influenza,trivalent, recombinat, inj, PF 04/14/2017   PPD Test 10/16/2022   Pneumococcal Polysaccharide-23 11/22/2015    Diabetes She presents for her initial diabetic visit. She has type 2 diabetes mellitus. Onset time: diagnosed at approx age of 68. Her disease course has been fluctuating. There are no hypoglycemic associated symptoms. Associated symptoms include fatigue and foot paresthesias. There are no hypoglycemic complications. Diabetic complications include PVD (hx partial toe amputation both feet). Risk factors for coronary artery disease include diabetes mellitus, dyslipidemia, family history and hypertension. Current diabetic treatment includes intensive insulin program. She is compliant with treatment most of the time. Her weight is fluctuating minimally. She is following a generally unhealthy diet. When asked about meal planning, she reported none. She has not had a previous visit with a dietitian. She participates in exercise intermittently. Her overall blood glucose range is >200 mg/dl. (She presents today for her consultation with her CGM on her phone showing gross hyperglycemia overall.  Her POCT  A1c today is 8.7%, improving drastically from last A1c of 13%.  She drinks a lot of sweetened beverages, little plain water.  She eats 2 meals a day plus snacks.  She works at [employer] and admits she succumbs to temptation more that she likes.  She does not engage in routine physical activity due to problems with her feet.  She is overdue for eye exam, does see podiatry routinely.  Analysis of her CGM shows TIR 17%, TAR 83%, TBR 0%.) An ACE inhibitor/angiotensin II receptor blocker is being taken. She does not see a podiatrist.Eye exam is current.     Review of systems  Constitutional: + Minimally fluctuating body weight, current Body mass index is 28.15 kg/m., no fatigue, no subjective hyperthermia, no subjective hypothermia Eyes: no  blurry vision, no xerophthalmia ENT: no sore throat, no nodules palpated in throat, no dysphagia/odynophagia, no hoarseness Cardiovascular: no chest pain, no shortness of breath, no palpitations, no leg swelling Respiratory: no cough, no shortness of breath Gastrointestinal: no nausea/vomiting/diarrhea Musculoskeletal: no muscle/joint aches Skin: no rashes, no hyperemia Neurological: no tremors, no numbness, no tingling, no dizziness Psychiatric: no depression, no anxiety  Objective:     BP 133/81 (BP Location: Left Arm, Patient Position: Sitting, Cuff Size: Large)   Pulse 87   Ht 5\' 6"  (1.676 m)   Wt 174 lb 6.4 oz (79.1 kg)   BMI 28.15 kg/m   Wt Readings from Last 3 Encounters:  01/16/23 174 lb 6.4 oz (79.1 kg)  12/27/22 178 lb (80.7 kg)  11/27/22 163 lb (73.9 kg)     BP Readings from Last 3 Encounters:  01/16/23 133/81  12/27/22 124/82  11/27/22 122/74     Physical Exam- Limited  Constitutional:  Body mass index is 28.15 kg/m. , not in acute distress, normal state of mind Eyes:  EOMI, no exophthalmos Neck: Supple Cardiovascular: RRR, no murmurs, rubs, or gallops, no edema Respiratory: Adequate breathing efforts, no crackles, rales,  rhonchi, or wheezing Musculoskeletal: no gross deformities, strength intact in all four extremities, no gross restriction of joint movements Skin:  no rashes, no hyperemia Neurological: no tremor with outstretched hands   Diabetic Foot Exam - Simple   No data filed     CMP ( most recent) CMP     Component Value Date/Time   NA 133 (L) 11/24/2022 0016   NA 132 (L) 09/04/2021 1456   K 4.3 11/24/2022 0016   CL 97 (L) 11/24/2022 0016   CO2 25 11/24/2022 0016   GLUCOSE 420 (H) 11/24/2022 0016   BUN 25 (H) 11/24/2022 0016   BUN 21 09/04/2021 1456   CREATININE 1.01 (H) 11/24/2022 0016   CREATININE 0.87 10/19/2022 1139   CALCIUM 9.1 11/24/2022 0016   PROT 7.0 11/24/2022 0016   PROT 6.8 01/11/2022 0916   ALBUMIN 3.3 (L) 11/24/2022 0016   ALBUMIN 3.9 01/11/2022 0916   AST 17 11/24/2022 0016   ALT 26 11/24/2022 0016   ALKPHOS 389 (H) 11/24/2022 0016   BILITOT 0.9 11/24/2022 0016   BILITOT 0.5 01/11/2022 0916   EGFR 64 09/04/2021 1456   GFRNONAA >60 11/24/2022 0016     Diabetic Labs (most recent): Lab Results  Component Value Date   HGBA1C 8.7 (A) 01/16/2023   HGBA1C 13.1 (H) 10/04/2022   HGBA1C 11.5 (H) 09/04/2021   MICROALBUR 17.2 10/04/2022     Lipid Panel ( most recent) Lipid Panel     Component Value Date/Time   CHOL 210 (H) 10/04/2022 1530   TRIG 49 10/04/2022 1530   HDL 88 10/04/2022 1530   CHOLHDL 2.4 10/04/2022 1530   LDLCALC 108 (H) 10/04/2022 1530      No results found for: "TSH", "FREET4"         Assessment & Plan:   1) Type 2 diabetes mellitus with hypoglycemia without coma, with long-term current use of insulin (HCC)  She presents today for her consultation with her CGM on her phone showing gross hyperglycemia overall.  Her POCT A1c today is 8.7%, improving drastically from last A1c of 13%.  She drinks a lot of sweetened beverages, little plain water.  She eats 2 meals a day plus snacks.  She works at [employer] and admits she succumbs to  temptation more that she likes.  She does not engage  in routine physical activity due to problems with her feet.  She is overdue for eye exam, does see podiatry routinely.  Analysis of her CGM shows TIR 17%, TAR 83%, TBR 0%.  - Anne Carney has currently uncontrolled symptomatic type 2 DM since 47 years of age, with most recent A1c of 8.7 %.   -Recent labs reviewed.  - I had a long discussion with her about the progressive nature of diabetes and the pathology behind its complications. -her diabetes is complicated by neuropathy, PVD- partial amputation of toe on bilateral feet and she remains at a high risk for more acute and chronic complications which include CAD, CVA, CKD, retinopathy, and neuropathy. These are all discussed in detail with her.  The following Lifestyle Medicine recommendations according to American College of Lifestyle Medicine [facility]) were discussed and offered to patient and she agrees to start the journey:  A. Whole Foods, Plant-based plate comprising of fruits and vegetables, plant-based proteins, whole-grain carbohydrates was discussed in detail with the patient.   A list for source of those nutrients were also provided to the patient.  Patient will use only water or unsweetened tea for hydration. B.  The need to stay away from risky substances including alcohol, smoking; obtaining 7 to 9 hours of restorative sleep, at least 150 minutes of moderate intensity exercise weekly, the importance of healthy social connections,  and stress reduction techniques were discussed. C.  A full color page of  Calorie density of various food groups per pound showing examples of each food groups was provided to the patient.  - I have counseled her on diet and weight management by adopting a carbohydrate restricted/protein rich diet. Patient is encouraged to switch to unprocessed or minimally processed complex starch and increased protein intake (animal or plant source), fruits, and  vegetables. -  she is advised to stick to a routine mealtimes to eat 3 meals a day and avoid unnecessary snacks (to snack only to correct hypoglycemia).   - she acknowledges that there is a room for improvement in her food and drink choices. - Suggestion is made for her to avoid simple carbohydrates from her diet including Cakes, Sweet Desserts, Ice Cream, Soda (diet and regular), Sweet Tea, Candies, Chips, Cookies, Store Bought Juices, Alcohol in Excess of 1-2 drinks a day, Artificial Sweeteners, Coffee Creamer, and "Sugar-free" Products. This will help patient to have more stable blood glucose profile and potentially avoid unintended weight gain.  - I have approached her with the following individualized plan to manage her diabetes and patient agrees:   -She is advised to adjust her Lantus to 20 units SQ nightly (not 30 units BID as she was previously having drops in glucose at night).  I also adjusted her Novolog to 6-12 units TID with meals if glucose is above 90 and she is eating (Specific instructions on how to titrate insulin dosage based on glucose readings given to patient in writing).  She demonstrated her ability to properly use the SSI chart to dose insulin with me today.  I do expect her glucose to rebound some with such drastic changes, but I am hoping she will start implementing some of the positive lifestyle changes that will help regain control of diabetes.  -she is encouraged to continue monitoring glucose 4 times daily (using her CGM), before meals and before bed, and bring CGM to review at follow up appointment in 3 months.  - she is warned not to take insulin without proper monitoring per orders. -  Adjustment parameters are given to her for hypo and hyperglycemia in writing. - she is encouraged to call clinic for blood glucose levels less than 70 or above 300 mg /dl.  - I advised her not to start the Ozempic (says it required PA and PCP hasn't yet completed it).  Due to cost and  BMI of near 25, she may not be the best candidate for this type of medication.  - Specific targets for  A1c; LDL, HDL, and Triglycerides were discussed with the patient.  2) Blood Pressure /Hypertension:  her blood pressure is controlled to target.   she is advised to continue her current medications including Lisinopril/HCT 10-12.5 mg p.o. daily with breakfast.  3) Lipids/Hyperlipidemia:    Review of her recent lipid panel from 10/04/22 showed uncontrolled LDL at 108 .  she is advised to continue Crestor 20 mg daily at bedtime.  Side effects and precautions discussed with her.  4)  Weight/Diet:  her Body mass index is 28.15 kg/m.  -  she is NOT a candidate for major weight loss. I discussed with her the fact that loss of 5 - 10% of her  current body weight will have the most impact on her diabetes management.  Exercise, and detailed carbohydrates information provided  -  detailed on discharge instructions.  5) Chronic Care/Health Maintenance: -she is on ACEI/ARB and Statin medications and is encouraged to initiate and continue to follow up with Ophthalmology, Dentist, Podiatrist at least yearly or according to recommendations, and advised to stay away from smoking. I have recommended yearly flu vaccine and pneumonia vaccine at least every 5 years; moderate intensity exercise for up to 150 minutes weekly; and sleep for at least 7 hours a day.  - she is advised to maintain close follow up with [name], DO for primary care needs, as well as her other providers for optimal and coordinated care.   - Time spent in this patient care: 60 min, of which > 50% was spent in counseling her about her diabetes and the rest reviewing her blood glucose logs, discussing her hypoglycemia and hyperglycemia episodes, reviewing her current and previous labs/studies (including abstraction from other facilities) and medications doses and developing a long term treatment plan based on the latest standards of  care/guidelines; and documenting her care.    Please refer to Patient Instructions for Blood Glucose Monitoring and Insulin/Medications Dosing Guide" in media tab for additional information. Please also refer to "Patient Self Inventory" in the Media tab for reviewed elements of pertinent patient history.  Anne Carney participated in the discussions, expressed understanding, and voiced agreement with the above plans.  All questions were answered to her satisfaction. she is encouraged to contact clinic should she have any questions or concerns prior to her return visit.     Follow up plan: - Return in about 3 months (around 04/18/2023) for Diabetes F/U with A1c in office, No previsit labs, Bring meter and logs.    Ronny Bacon, [facility] [address] [place], [place] [address] Phone: [phone] Fax: [phone]  01/16/2023, 4:39 PM

## 2023-01-28 ENCOUNTER — Ambulatory Visit: Payer: Medicaid Other | Admitting: Family Medicine

## 2023-01-31 ENCOUNTER — Telehealth: Payer: Self-pay | Admitting: *Deleted

## 2023-01-31 NOTE — Telephone Encounter (Signed)
Patient left a voicemail. She shared that she was connected to [place] with her [employer]. She ask for a call back and also gave permission to leave a voicemail. Printed the patient's AGP report, gave to [facility] for review.She recommended that the patient increase her Lantus to 30 units nightly , increase her Novolog to 8-14 units three times a day.. I called the patient and left her a voicemail with this information.

## 2023-02-07 ENCOUNTER — Ambulatory Visit: Payer: Medicaid Other | Admitting: Family Medicine

## 2023-03-07 ENCOUNTER — Telehealth: Payer: Self-pay | Admitting: *Deleted

## 2023-03-07 NOTE — Telephone Encounter (Signed)
Patient left a message that she felt like her medications may need to be adjusted. They are running high. Patient was called. She states that the bllod sugars have been running high for the ;last week.. Pt called with high BG readings.   Date Before breakfast Before lunch Before supper Bedtime  03/04/23                         Patient has the [place]- printed off her readings. Pt taking: Lantus  30 units at night , Novolog sliding scale 8-14 units.

## 2023-03-08 NOTE — Telephone Encounter (Signed)
I see her glucose is fluctuating dramatically.  Reinforce the need to eliminate all sweetened beverages (even if they say zero sugar).  Most of her high readings are during the day, so looks like we need to increase her meal time insulin (Novolog) to 10-16 units TID prior to a meal based on the sliding scale.

## 2023-03-08 NOTE — Telephone Encounter (Signed)
Patient was called and a message was left for her to call the office back. 

## 2023-03-08 NOTE — Telephone Encounter (Signed)
Patient was called and given the instructions. She ask that I call her back and leave it on her voicemail as she was at work. This was done.

## 2023-03-12 ENCOUNTER — Emergency Department (HOSPITAL_COMMUNITY)
Admission: EM | Admit: 2023-03-12 | Discharge: 2023-03-12 | Disposition: A | Discharge status: Home / Self Care | Payer: Medicaid Other | Attending: Emergency Medicine | Admitting: Emergency Medicine

## 2023-03-12 ENCOUNTER — Other Ambulatory Visit: Payer: Self-pay

## 2023-03-12 ENCOUNTER — Encounter (HOSPITAL_COMMUNITY): Payer: Self-pay | Admitting: Emergency Medicine

## 2023-03-12 ENCOUNTER — Emergency Department (HOSPITAL_COMMUNITY): Payer: Medicaid Other

## 2023-03-12 DIAGNOSIS — I10 Essential (primary) hypertension: Secondary | ICD-10-CM | POA: Diagnosis not present

## 2023-03-12 DIAGNOSIS — Z79899 Other long term (current) drug therapy: Secondary | ICD-10-CM | POA: Diagnosis not present

## 2023-03-12 DIAGNOSIS — E1140 Type 2 diabetes mellitus with diabetic neuropathy, unspecified: Secondary | ICD-10-CM | POA: Insufficient documentation

## 2023-03-12 DIAGNOSIS — G4489 Other headache syndrome: Secondary | ICD-10-CM | POA: Diagnosis not present

## 2023-03-12 DIAGNOSIS — Z794 Long term (current) use of insulin: Secondary | ICD-10-CM | POA: Diagnosis not present

## 2023-03-12 DIAGNOSIS — R42 Dizziness and giddiness: Secondary | ICD-10-CM

## 2023-03-12 DIAGNOSIS — R519 Headache, unspecified: Secondary | ICD-10-CM | POA: Diagnosis not present

## 2023-03-12 LAB — CBC
HCT: 38 % (ref 36.0–46.0)
Hemoglobin: 13 g/dL (ref 12.0–15.0)
MCH: 29.8 pg (ref 26.0–34.0)
MCHC: 34.2 g/dL (ref 30.0–36.0)
MCV: 87.2 fL (ref 80.0–100.0)
Platelets: 182 10*3/uL (ref 150–400)
RBC: 4.36 MIL/uL (ref 3.87–5.11)
RDW: 12.3 % (ref 11.5–15.5)
WBC: 10.8 10*3/uL — ABNORMAL HIGH (ref 4.0–10.5)
nRBC: 0 % (ref 0.0–0.2)

## 2023-03-12 LAB — COMPREHENSIVE METABOLIC PANEL
ALT: 32 U/L (ref 0–44)
AST: 40 U/L (ref 15–41)
Albumin: 3.6 g/dL (ref 3.5–5.0)
Alkaline Phosphatase: 292 U/L — ABNORMAL HIGH (ref 38–126)
Anion gap: 15 (ref 5–15)
BUN: 29 mg/dL — ABNORMAL HIGH (ref 6–20)
CO2: 21 mmol/L — ABNORMAL LOW (ref 22–32)
Calcium: 8.9 mg/dL (ref 8.9–10.3)
Chloride: 95 mmol/L — ABNORMAL LOW (ref 98–111)
Creatinine, Ser: 1.15 mg/dL — ABNORMAL HIGH (ref 0.44–1.00)
GFR, Estimated: 59 mL/min — ABNORMAL LOW (ref >60)
Glucose, Bld: 284 mg/dL — ABNORMAL HIGH (ref 70–99)
Potassium: 4.5 mmol/L (ref 3.5–5.1)
Sodium: 131 mmol/L — ABNORMAL LOW (ref 135–145)
Total Bilirubin: 0.8 mg/dL (ref 0.3–1.2)
Total Protein: 7.5 g/dL (ref 6.5–8.1)

## 2023-03-12 LAB — HCG, SERUM, QUALITATIVE: Preg, Serum: NEGATIVE

## 2023-03-12 LAB — CBG MONITORING, ED: Glucose-Capillary: 148 mg/dL — ABNORMAL HIGH (ref 70–99)

## 2023-03-12 MED ORDER — LABETALOL HCL 5 MG/ML IV SOLN
20.0000 mg | Freq: Once | INTRAVENOUS | Status: AC
  Administered 2023-03-12: 20 mg via INTRAVENOUS
  Filled 2023-03-12: qty 4

## 2023-03-12 MED ORDER — METOCLOPRAMIDE HCL 5 MG/ML IJ SOLN
10.0000 mg | Freq: Once | INTRAMUSCULAR | Status: AC
  Administered 2023-03-12: 10 mg via INTRAVENOUS
  Filled 2023-03-12: qty 2

## 2023-03-12 MED ORDER — MECLIZINE HCL 12.5 MG PO TABS
25.0000 mg | ORAL_TABLET | Freq: Once | ORAL | Status: AC
  Administered 2023-03-12: 25 mg via ORAL
  Filled 2023-03-12: qty 2

## 2023-03-12 MED ORDER — MECLIZINE HCL 25 MG PO TABS: 25.0000 mg | ORAL_TABLET | Freq: Three times a day (TID) | ORAL | 0 refills | Status: DC | PRN

## 2023-03-12 MED ORDER — SODIUM CHLORIDE 0.9 % IV BOLUS
1000.0000 mL | Freq: Once | INTRAVENOUS | Status: AC
  Administered 2023-03-12: 1000 mL via INTRAVENOUS

## 2023-03-12 MED ORDER — GADOBUTROL 1 MMOL/ML IV SOLN
7.0000 mL | Freq: Once | INTRAVENOUS | Status: AC | PRN
  Administered 2023-03-12: 7 mL via INTRAVENOUS

## 2023-03-12 MED ORDER — LORAZEPAM 2 MG/ML IJ SOLN
1.0000 mg | Freq: Once | INTRAMUSCULAR | Status: AC
  Administered 2023-03-12: 1 mg via INTRAVENOUS
  Filled 2023-03-12: qty 1

## 2023-03-12 MED ORDER — DIPHENHYDRAMINE HCL 50 MG/ML IJ SOLN
25.0000 mg | Freq: Once | INTRAMUSCULAR | Status: AC
  Administered 2023-03-12: 25 mg via INTRAVENOUS
  Filled 2023-03-12: qty 1

## 2023-03-12 MED ORDER — IBUPROFEN 800 MG PO TABS: 800.0000 mg | ORAL_TABLET | Freq: Three times a day (TID) | ORAL | 0 refills | Status: DC | PRN

## 2023-03-12 MED ORDER — KETOROLAC TROMETHAMINE 30 MG/ML IJ SOLN
30.0000 mg | Freq: Once | INTRAMUSCULAR | Status: AC
  Administered 2023-03-12: 30 mg via INTRAVENOUS
  Filled 2023-03-12: qty 1

## 2023-03-12 MED ORDER — ONDANSETRON 4 MG PO TBDP: ORAL_TABLET | ORAL | 0 refills | Status: DC

## 2023-03-12 NOTE — ED Triage Notes (Signed)
Pt c/o frontal headache for the past several hours. Pt states she just doesn't feel right. Pt also endorses vomiting prior to leaving home tonight.

## 2023-03-12 NOTE — ED Notes (Signed)
Patient transported to MRI 

## 2023-03-12 NOTE — ED Notes (Signed)
Pt passed swallow screen

## 2023-03-12 NOTE — ED Notes (Signed)
Pts HR in the 120s, MD made aware

## 2023-03-12 NOTE — ED Provider Notes (Signed)
[facility] EMERGENCY DEPARTMENT AT [facility] Provider Note   CSN: [id number] Arrival date & time: 03/12/23  0134     History  Chief Complaint  Patient presents with   Headache    Anne Carney is a 47 y.o. female.  The history is provided by the patient.  Headache Pain location:  Generalized Onset quality:  Gradual Timing:  Constant Progression:  Worsening Chronicity:  New Relieved by:  Nothing Exacerbated by: movement. Associated symptoms: vomiting   Patient with history of hypertension and diabetes presents with headache.  She reports gradual onset of headache since 1700 yesterday She reports nausea and vomiting.  No fevers.  She reports feeling generalized weakness but no unilateral weakness.  No slurred speech. She reports previous history of headaches but has been several years No recent trauma    Past Medical History:  Diagnosis Date   Diabetes mellitus    Hypertension    Kidney disease    Neuropathy     Home Medications Prior to Admission medications   Medication Sig Start Date End Date Taking? Authorizing Provider  blood glucose meter kit and supplies KIT Dispense based on patient and insurance preference. Use to check sugar up ro four times daily as directed. 10/24/20   Vassie Loll, MD  Continuous Glucose Sensor (FREESTYLE LIBRE 3 SENSOR) MISC Apply every 2 weeks. Use continuously to check blood sugar. 10/18/22   [name], DO  furosemide (LASIX) 20 MG tablet Take 20 mg by mouth daily as needed. 09/28/20   [provider]  gabapentin (NEURONTIN) 100 MG capsule Take 1 capsule (100 mg total) by mouth 2 (two) times daily. Patient taking differently: Take 200 mg by mouth 2 (two) times daily. Take 2 100 mg tablets twice daily 10/24/20   Vassie Loll, MD  insulin aspart (NOVOLOG FLEXPEN) 100 UNIT/ML FlexPen Inject 6-12 Units into the skin 3 (three) times daily with meals. 01/16/23   Dani Gobble, NP  insulin glargine (LANTUS) 100  unit/mL SOPN Inject 30 Units into the skin at bedtime. 01/16/23   Dani Gobble, NP  Insulin Pen Needle 32G X 4 MM MISC Use up to 5 times daily to administer insulin. 11/27/22   [name], DO  Iron, Ferrous Sulfate, 325 (65 Fe) MG TABS Take 325 mg by mouth every other day. 10/11/22   [name], DO  lisinopril-hydrochlorothiazide (ZESTORETIC) 10-12.5 MG tablet Take 1 tablet by mouth daily. 10/05/22   [name], DO  meclizine (ANTIVERT) 25 MG tablet Take 1 tablet (25 mg total) by mouth every 8 (eight) hours as needed for dizziness. 10/24/20   Vassie Loll, MD  rosuvastatin (CRESTOR) 20 MG tablet Take 1 tablet (20 mg total) by mouth daily. 10/11/22   [name], DO      Allergies    Patient has no known allergies.    Review of Systems   Review of Systems  Gastrointestinal:  Positive for vomiting.  Neurological:  Positive for headaches.    Physical Exam Updated Vital Signs BP (!) 189/93   Pulse (!) 113   Temp 98.8 F (37.1 C) (Oral)   Resp (!) 21   Ht 1.676 m (5\' 6" )   Wt 79.1 kg   SpO2 99%   BMI 28.15 kg/m  Physical Exam CONSTITUTIONAL: Well developed/well nourished, uncomfortable appearing HEAD: Normocephalic/atraumatic EYES: EOMI/PERRL, no nystagmus, no ptosis, no corneal haziness ENMT: Mucous membranes moist NECK: supple no meningeal signs, no bruits CV: S1/S2 noted, no murmurs/rubs/gallops  noted LUNGS: Lungs are clear to auscultation bilaterally, no apparent distress ABDOMEN: soft NEURO:Awake/alert, face symmetric, no arm or leg drift is noted Equal 5/5 strength with shoulder abduction, elbow flex/extension, wrist flex/extension in upper extremities and equal hand grips bilaterally Equal 5/5 strength with hip flexion,knee flex/extension, foot dorsi/plantar flexion Cranial nerves 3/4/5/6/12/17/08/11/12 tested and intact EXTREMITIES: pulses normal, full ROM SKIN: warm, color normal  ED Results / Procedures / Treatments   Labs (all labs ordered are listed, but  only abnormal results are displayed) Labs Reviewed  CBC - Abnormal; Notable for the following components:      Result Value   WBC 10.8 (*)    All other components within normal limits  CBG MONITORING, ED - Abnormal; Notable for the following components:   Glucose-Capillary 148 (*)    All other components within normal limits  HCG, SERUM, QUALITATIVE  COMPREHENSIVE METABOLIC PANEL    EKG EKG Interpretation Date/Time:  Tuesday March 12 2023 05:23:26 EDT Ventricular Rate:  111 PR Interval:  171 QRS Duration:  91 QT Interval:  335 QTC Calculation: 456 R Axis:   2  Text Interpretation: Sinus tachycardia Low voltage, precordial leads No significant change since last tracing Confirmed by Zadie Rhine ([id number]) on 03/12/2023 5:50:56 AM  Radiology CT HEAD WO CONTRAST  Result Date: 03/12/2023 CLINICAL DATA:  Headache, tension type EXAM: CT HEAD WITHOUT CONTRAST TECHNIQUE: Contiguous axial images were obtained from the base of the skull through the vertex without intravenous contrast. RADIATION DOSE REDUCTION: This exam was performed according to the departmental dose-optimization program which includes automated exposure control, adjustment of the mA and/or kV according to patient size and/or use of iterative reconstruction technique. COMPARISON:  10/23/2020 FINDINGS: Brain: No evidence of acute infarction, hemorrhage, hydrocephalus, extra-axial collection or mass lesion/mass effect. Vascular: No hyperdense vessel or unexpected calcification. Skull: Normal. Negative for fracture or focal lesion. Sinuses/Orbits: No acute finding. IMPRESSION: Negative head CT.  No explanation for headache. Electronically Signed   By: Tiburcio Pea M.D.   On: 03/12/2023 05:14    Procedures Procedures    Medications Ordered in ED Medications  LORazepam (ATIVAN) injection 1 mg (has no administration in time range)  metoCLOPramide (REGLAN) injection 10 mg (10 mg Intravenous Given 03/12/23 0544)   diphenhydrAMINE (BENADRYL) injection 25 mg (25 mg Intravenous Given 03/12/23 0544)    ED Course/ Medical Decision Making/ A&P Clinical Course as of 03/12/23 0700  Tue Mar 12, 2023  0508 Glucose-Capillary(!): 148 Mild hyperglycemia [DW]  7829 Patient reports gradual onset of headache this been worsening over the past 12 hours.  She denies any fevers or infectious type symptoms.  Patient is ill-appearing at this time.  I elected to order CT head and treat headache. [DW]  5621 CT head is negative.  Patient reports continued headache.  I sat her up in the bed and she had immediate onset of dizziness and felt as she was going to vomit.  Patient is a poor historian but appears a headaches been gradually worsening over the past 12 hours with associated nausea, vomiting and dizziness.  There is no gross neurodeficits.  However due to persistent symptoms with hypertension, will pursue further evaluation.  Plan for MRI/MRV of brain to evaluate for CVA as well as other acute neurologic emergencies. [DW]  [phone] Signed out to Dr Estell Harpin at shift change to f/u on MRI/MRV.  If negative and she is improved she may be amenable to d/c home [DW]    Clinical Course User Index [DW] Bebe Shaggy,  Dorinda Hill, MD                                 Medical Decision Making Amount and/or Complexity of Data Reviewed Labs: ordered. Decision-making details documented in ED Course. Radiology: ordered.  Risk Prescription drug management.   This patient presents to the ED for concern of headache, this involves an extensive number of treatment options, and is a complaint that carries with it a high risk of complications and morbidity.  The differential diagnosis includes but is not limited to subarachnoid hemorrhage, intracranial hemorrhage, meningitis, encephalitis, CVST, temporal arteritis, idiopathic intracranial hypertension, migraine    Comorbidities that complicate the patient evaluation: Patient's presentation is  complicated by their history of diabetes   Additional history obtained: Records reviewed previous admission documents  Lab Tests: I Ordered, and personally interpreted labs.  The pertinent results include:  hyperglycemia  Imaging Studies ordered: I ordered imaging studies including CT scan head   I independently visualized and interpreted imaging which showed no acute findings I agree with the radiologist interpretation  Cardiac Monitoring: The patient was maintained on a cardiac monitor.  I personally viewed and interpreted the cardiac monitor which showed an underlying rhythm of:  sinus rhythm  Medicines ordered and prescription drug management: I ordered medication including reglan and benadryl  for headache  Reevaluation of the patient after these medicines showed that the patient    improved  Reevaluation: After the interventions noted above, I reevaluated the patient and found that they have :improved  Complexity of problems addressed: Patient's presentation is most consistent with  acute presentation with potential threat to life or bodily function          Final Clinical Impression(s) / ED Diagnoses Final diagnoses:  Other headache syndrome    Rx / DC Orders ED Discharge Orders     None         Zadie Rhine, MD 03/12/23 0700

## 2023-03-13 DIAGNOSIS — N179 Acute kidney failure, unspecified: Secondary | ICD-10-CM | POA: Diagnosis not present

## 2023-03-13 DIAGNOSIS — G4489 Other headache syndrome: Secondary | ICD-10-CM | POA: Diagnosis not present

## 2023-03-13 DIAGNOSIS — R42 Dizziness and giddiness: Secondary | ICD-10-CM | POA: Diagnosis not present

## 2023-03-13 DIAGNOSIS — E1100 Type 2 diabetes mellitus with hyperosmolarity without nonketotic hyperglycemic-hyperosmolar coma (NKHHC): Secondary | ICD-10-CM | POA: Diagnosis not present

## 2023-03-13 DIAGNOSIS — R519 Headache, unspecified: Secondary | ICD-10-CM | POA: Diagnosis not present

## 2023-03-13 DIAGNOSIS — E871 Hypo-osmolality and hyponatremia: Secondary | ICD-10-CM | POA: Diagnosis not present

## 2023-03-13 DIAGNOSIS — R4182 Altered mental status, unspecified: Secondary | ICD-10-CM | POA: Diagnosis not present

## 2023-03-13 DIAGNOSIS — R531 Weakness: Secondary | ICD-10-CM | POA: Diagnosis not present

## 2023-03-13 DIAGNOSIS — E872 Acidosis, unspecified: Secondary | ICD-10-CM | POA: Diagnosis not present

## 2023-03-13 DIAGNOSIS — R000 Tachycardia, unspecified: Secondary | ICD-10-CM | POA: Diagnosis not present

## 2023-03-13 DIAGNOSIS — E875 Hyperkalemia: Secondary | ICD-10-CM | POA: Diagnosis not present

## 2023-03-13 DIAGNOSIS — I10 Essential (primary) hypertension: Secondary | ICD-10-CM | POA: Diagnosis not present

## 2023-03-13 DIAGNOSIS — R739 Hyperglycemia, unspecified: Secondary | ICD-10-CM | POA: Diagnosis not present

## 2023-03-13 DIAGNOSIS — G9341 Metabolic encephalopathy: Secondary | ICD-10-CM | POA: Diagnosis not present

## 2023-03-13 DIAGNOSIS — I639 Cerebral infarction, unspecified: Secondary | ICD-10-CM | POA: Diagnosis not present

## 2023-03-13 DIAGNOSIS — R748 Abnormal levels of other serum enzymes: Secondary | ICD-10-CM | POA: Diagnosis not present

## 2023-03-13 DIAGNOSIS — Z79899 Other long term (current) drug therapy: Secondary | ICD-10-CM | POA: Diagnosis not present

## 2023-03-13 DIAGNOSIS — R29898 Other symptoms and signs involving the musculoskeletal system: Secondary | ICD-10-CM | POA: Diagnosis not present

## 2023-03-13 DIAGNOSIS — E861 Hypovolemia: Secondary | ICD-10-CM | POA: Diagnosis not present

## 2023-03-13 DIAGNOSIS — J0110 Acute frontal sinusitis, unspecified: Secondary | ICD-10-CM | POA: Diagnosis not present

## 2023-03-13 DIAGNOSIS — Z794 Long term (current) use of insulin: Secondary | ICD-10-CM | POA: Diagnosis not present

## 2023-03-14 DIAGNOSIS — R519 Headache, unspecified: Secondary | ICD-10-CM | POA: Diagnosis not present

## 2023-03-14 DIAGNOSIS — E1100 Type 2 diabetes mellitus with hyperosmolarity without nonketotic hyperglycemic-hyperosmolar coma (NKHHC): Secondary | ICD-10-CM | POA: Diagnosis not present

## 2023-03-14 DIAGNOSIS — E871 Hypo-osmolality and hyponatremia: Secondary | ICD-10-CM | POA: Diagnosis not present

## 2023-03-14 DIAGNOSIS — I10 Essential (primary) hypertension: Secondary | ICD-10-CM | POA: Diagnosis not present

## 2023-03-14 DIAGNOSIS — R531 Weakness: Secondary | ICD-10-CM | POA: Diagnosis not present

## 2023-03-15 ENCOUNTER — Encounter: Payer: Self-pay | Admitting: Family Medicine

## 2023-03-15 ENCOUNTER — Ambulatory Visit: Payer: Medicaid Other | Admitting: Family Medicine

## 2023-03-15 DIAGNOSIS — N179 Acute kidney failure, unspecified: Secondary | ICD-10-CM | POA: Diagnosis not present

## 2023-03-15 DIAGNOSIS — E871 Hypo-osmolality and hyponatremia: Secondary | ICD-10-CM | POA: Diagnosis not present

## 2023-03-15 DIAGNOSIS — E875 Hyperkalemia: Secondary | ICD-10-CM | POA: Diagnosis not present

## 2023-03-15 DIAGNOSIS — R519 Headache, unspecified: Secondary | ICD-10-CM | POA: Diagnosis not present

## 2023-03-15 DIAGNOSIS — I10 Essential (primary) hypertension: Secondary | ICD-10-CM | POA: Diagnosis not present

## 2023-03-15 DIAGNOSIS — Z79899 Other long term (current) drug therapy: Secondary | ICD-10-CM | POA: Diagnosis not present

## 2023-03-15 DIAGNOSIS — Z794 Long term (current) use of insulin: Secondary | ICD-10-CM | POA: Diagnosis not present

## 2023-03-15 DIAGNOSIS — E1100 Type 2 diabetes mellitus with hyperosmolarity without nonketotic hyperglycemic-hyperosmolar coma (NKHHC): Secondary | ICD-10-CM | POA: Diagnosis not present

## 2023-03-15 DIAGNOSIS — R531 Weakness: Secondary | ICD-10-CM | POA: Diagnosis not present

## 2023-03-15 DIAGNOSIS — E872 Acidosis, unspecified: Secondary | ICD-10-CM | POA: Diagnosis not present

## 2023-03-18 ENCOUNTER — Other Ambulatory Visit: Payer: Self-pay | Admitting: *Deleted

## 2023-03-18 ENCOUNTER — Telehealth: Payer: Self-pay | Admitting: Nurse Practitioner

## 2023-03-18 DIAGNOSIS — R262 Difficulty in walking, not elsewhere classified: Secondary | ICD-10-CM | POA: Diagnosis not present

## 2023-03-18 DIAGNOSIS — R42 Dizziness and giddiness: Secondary | ICD-10-CM | POA: Diagnosis not present

## 2023-03-18 DIAGNOSIS — Z7985 Long-term (current) use of injectable non-insulin antidiabetic drugs: Secondary | ICD-10-CM

## 2023-03-18 DIAGNOSIS — R2681 Unsteadiness on feet: Secondary | ICD-10-CM | POA: Diagnosis not present

## 2023-03-18 DIAGNOSIS — M6281 Muscle weakness (generalized): Secondary | ICD-10-CM | POA: Diagnosis not present

## 2023-03-18 DIAGNOSIS — E11649 Type 2 diabetes mellitus with hypoglycemia without coma: Secondary | ICD-10-CM

## 2023-03-18 DIAGNOSIS — Z794 Long term (current) use of insulin: Secondary | ICD-10-CM

## 2023-03-18 DIAGNOSIS — R260 Ataxic gait: Secondary | ICD-10-CM | POA: Diagnosis not present

## 2023-03-18 MED ORDER — FREESTYLE LIBRE 3 PLUS SENSOR MISC
3 refills | Status: DC
Start: 2023-03-18 — End: 2023-10-10

## 2023-03-18 MED ORDER — FREESTYLE LIBRE 3 PLUS SENSOR MISC
3 refills | Status: DC
Start: 2023-03-18 — End: 2023-04-18

## 2023-03-18 MED ORDER — FREESTYLE LIBRE 3 PLUS SENSOR MISC: 3 refills | Status: DC

## 2023-03-18 NOTE — Telephone Encounter (Signed)
Pt said ins is needing prescription sent in for the [place] plus 3 to [facility] in [place].  Said they would no longer cover dexcom.

## 2023-03-18 NOTE — Telephone Encounter (Signed)
done

## 2023-03-19 ENCOUNTER — Encounter: Payer: Self-pay | Admitting: Gastroenterology

## 2023-03-19 ENCOUNTER — Ambulatory Visit: Payer: Medicaid Other | Admitting: Gastroenterology

## 2023-03-19 ENCOUNTER — Telehealth: Payer: Self-pay

## 2023-03-19 VITALS — BP 124/80 | HR 91 | Temp 97.9°F | Ht 66.0 in | Wt 174.6 lb

## 2023-03-19 DIAGNOSIS — R7989 Other specified abnormal findings of blood chemistry: Secondary | ICD-10-CM | POA: Diagnosis not present

## 2023-03-19 DIAGNOSIS — K5900 Constipation, unspecified: Secondary | ICD-10-CM | POA: Diagnosis not present

## 2023-03-19 DIAGNOSIS — D509 Iron deficiency anemia, unspecified: Secondary | ICD-10-CM | POA: Diagnosis not present

## 2023-03-19 MED ORDER — LINACLOTIDE 145 MCG PO CAPS: 145.0000 ug | ORAL_CAPSULE | Freq: Every day | ORAL | 5 refills | Status: DC

## 2023-03-19 NOTE — Progress Notes (Unsigned)
GI Office Note    Referring Provider: [name], DO Primary Care Physician:  [name], DO  Primary Gastroenterologist: Hennie Duos. Marletta Lor, DO   Chief Complaint   Chief Complaint  Patient presents with   Hospitalization Follow-up    Was dc'd from [facility] on 03/15/2023    History of Present Illness   Anne Carney is a 47 y.o. female presenting today for hospital follow up. She has history of GERD, LUQ pain, early satiety/nausea, elevated LFTs, IDA.  Recent admission at [facility] for hyperosmolar hyperglycemic state.  Presents back today for follow-up.  Her son Lolita Patella Murchinson is a patient here, had liver biopsy (nonspecific findings). Has had elevated ferritin, iron sat, but negative hemochromatosis genetics. Referral to [facility] due to inconclusive work up. Patient with chronically elevated LFTS, mostly alk phos, undetermined etiology to date.   Labs during recent hospitalization, 03/2023: Hgb 11.2, platelet 209K, alb 2.6, Tbili 0.6, AP 322-->253H, AST 21, ALT 21, Tbili 0.6. Glucose 503. UDS negative. Ethanol <3. Creatinine 0.88  Today:  No BM in 8 days. Pass some gas. Drank some prune juice with no results. Sometimes has issues with constipation. No melena, brbpr. No abdominal pain. Some nausea and vomiting while in the hospital. None in the past two days. Headache is better then before her hospitalizaiton. No heartburn. Working with endocrinology on your diabetes. Last A1C 8.7 in 01/2023, down from 13.1.  Prior data:   Labs for elevated LFTs March 2023, LFTs normal except for alk phos of 251.  Hep B and C screen negative.  AMA less than 20.  Platelets slightly low, 142,000.  No iron overload or trend towards iron deficiency.  A1c was 11.5. Labs from May 2023: GGT 163.   Labs from April 2024: PBC panel negative.  Hemoglobin 11.6, hematocrit 37, MCV 86.4, platelets normal, ferritin 13, iron 106, TIBC 432, iron saturation is 25%, B12 and folate normal.  A1c 13.1,  total bilirubin 0.6, alkaline phosphatase 382, AST 50, ALT 38, albumin 3.8.   Labs from May 2024: GGT 238, alkaline phosphatase 364, intestinal isoenzyme 0, bone isoenzymes 23, liver enzymes 63, macro hepatic isoenzymes 14.   Abdominal ultrasound April 2023: Liver unremarkable, 3.7 cm left renal upper pole mass noted.   CT abdomen with and without contrast April 2023 for renal mass: Liver unremarkable.  No renal mass seen.  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    Right upper quadrant ultrasound January 2024: Normal.   MRI Abd/MRCP 03/2022: IMPRESSION: Motion degraded examination.  Within this context: 1. No acute abnormality identified in the abdomen. 2. Moderate volume of formed stool throughout the colon. 3. Uterine leiomyomas.   CT abdomen pelvis with contrast November 2023: IMPRESSION: 1. No acute intra-abdominal or pelvic pathology. 2. Moderate colonic stool burden. No bowel obstruction. Normal appendix. 3. Uterine fibroid. 4. Displaced left tubal ligation clip.  RUQ U/S 06/2022: normal   Colonoscopy 04/2022: -two 3-25mm polyps in descending colon, stool in entire colon -repeat colonoscopy in 5 years for surveillance and borderline prep -sessile serrated adenoma.     EGD April 2023: - Z-line regular, 35 cm from the incisors. - Erythematous mucosa in the antrum. Biopsied. - Normal duodenal bulb, first portion of the duodenum and second portion of the duodenum. -Gastric biopsy with mild nonspecific reactive gastropathy, no H. Pylori   Colonoscopy April 2023: - Preparation of the colon was inadequate. - Stool in the entire  examined colon. - No specimens collected. -Attempt colonoscopy in 3 to 6 months.      Medications   Current Outpatient Medications  Medication Sig Dispense Refill   blood glucose meter kit and supplies KIT Dispense based on patient and insurance preference. Use to check sugar up ro four times daily  as directed. 1 each 0   Continuous Glucose Sensor (FREESTYLE LIBRE 3 PLUS SENSOR) MISC Change sensor every 15 days. 2 each 3   Continuous Glucose Sensor (FREESTYLE LIBRE 3 PLUS SENSOR) MISC Change sensor every 15 days. 6 each 3   furosemide (LASIX) 20 MG tablet Take 20 mg by mouth daily as needed.     gabapentin (NEURONTIN) 100 MG capsule Take 1 capsule (100 mg total) by mouth 2 (two) times daily. (Patient taking differently: Take 200 mg by mouth 2 (two) times daily. Take 2 100 mg tablets twice daily)     ibuprofen (ADVIL) 800 MG tablet Take 1 tablet (800 mg total) by mouth every 8 (eight) hours as needed for headache. 21 tablet 0   insulin aspart (NOVOLOG FLEXPEN) 100 UNIT/ML FlexPen Inject 6-12 Units into the skin 3 (three) times daily with meals. (Patient taking differently: Inject 10-16 Units into the skin 3 (three) times daily with meals.) 30 mL 3   insulin glargine (LANTUS) 100 unit/mL SOPN Inject 30 Units into the skin at bedtime. 18 mL 6   Insulin Pen Needle 32G X 4 MM MISC Use up to 5 times daily to administer insulin. 200 each 3   Iron, Ferrous Sulfate, 325 (65 Fe) MG TABS Take 325 mg by mouth every other day. 45 tablet 1   lisinopril-hydrochlorothiazide (ZESTORETIC) 10-12.5 MG tablet Take 1 tablet by mouth daily. 90 tablet 3   meclizine (ANTIVERT) 25 MG tablet Take 1 tablet (25 mg total) by mouth 3 (three) times daily as needed for dizziness. 30 tablet 0   ondansetron (ZOFRAN-ODT) 4 MG disintegrating tablet 4mg  ODT q4 hours prn nausea/vomit 10 tablet 0   No current facility-administered medications for this visit.    Allergies   Allergies as of 03/19/2023   (No Known Allergies)       Review of Systems   General: Negative for  weight loss, fever, chills, fatigue, weakness. ENT: Negative for hoarseness, difficulty swallowing , nasal congestion. CV: Negative for chest pain, angina, palpitations, dyspnea on exertion, peripheral edema.  Respiratory: Negative for dyspnea at rest,  dyspnea on exertion, cough, sputum, wheezing.  GI: See history of present illness. GU:  Negative for dysuria, hematuria, urinary incontinence, urinary frequency, nocturnal urination.  Endo: Negative for unusual weight change.     Physical Exam   BP 124/80 (BP Location: Right Arm, Patient Position: Sitting, Cuff Size: Normal)   Pulse 91   Temp 97.9 F (36.6 C) (Oral)   Ht 5\' 6"  (1.676 m)   Wt 174 lb 9.6 oz (79.2 kg)   LMP 03/12/2023 (Approximate)   SpO2 100%   BMI 28.18 kg/m    General: Well-nourished, well-developed in no acute distress.  Eyes: No icterus. Mouth: Oropharyngeal mucosa moist and pink  Abdomen: Bowel sounds are normal, nontender, nondistended, no hepatosplenomegaly or masses,  no abdominal bruits or hernia , no rebound or guarding.  Rectal: not performed  Extremities: No lower extremity edema. No clubbing or deformities. Neuro: Alert and oriented x 4   Skin: Warm and dry, no jaundice.   Psych: Alert and cooperative, normal mood and affect.  Labs   See hpi  Imaging Studies  MR MRV HEAD WO CM  Result Date: 03/12/2023 CLINICAL DATA:  Dural venous sinus thrombosis suspected.  Headache. EXAM: MR VENOGRAM HEAD WITHOUT CONTRAST TECHNIQUE: Angiographic images of the intracranial venous structures were acquired using MRV technique without intravenous contrast. COMPARISON:  Contemporaneous brain MRI. FINDINGS: Single motion degraded acquisition shows no dural venous sinus thrombosis or stenosis. Arachnoid granulation type defect with asymmetrically slowed flow at the left transverse sigmoid junction. The deep and cortical veins show no evidence of thrombosis. IMPRESSION: Motion degraded and truncated study that is negative for dural venous sinus thrombosis. Electronically Signed   By: Tiburcio Pea M.D.   On: 03/12/2023 09:14   MR BRAIN WO CONTRAST  Result Date: 03/12/2023 CLINICAL DATA:  Headache with increasing frequency or severity. EXAM: MRI HEAD WITHOUT CONTRAST  TECHNIQUE: Multiplanar, multiecho pulse sequences of the brain and surrounding structures were obtained without intravenous contrast. COMPARISON:  Head CT from earlier today FINDINGS: Brain: No acute infarction, hemorrhage, hydrocephalus, extra-axial collection or mass lesion. Vascular: Normal flow voids. Skull and upper cervical spine: Normal marrow signal. Sinuses/Orbits: Trace mucosal thickening in paranasal sinuses. Negative orbits. Other: Intermittent motion artifact IMPRESSION: Normal MRI of the brain.  No explanation for headache. Electronically Signed   By: Tiburcio Pea M.D.   On: 03/12/2023 09:10   CT HEAD WO CONTRAST  Result Date: 03/12/2023 CLINICAL DATA:  Headache, tension type EXAM: CT HEAD WITHOUT CONTRAST TECHNIQUE: Contiguous axial images were obtained from the base of the skull through the vertex without intravenous contrast. RADIATION DOSE REDUCTION: This exam was performed according to the departmental dose-optimization program which includes automated exposure control, adjustment of the mA and/or kV according to patient size and/or use of iterative reconstruction technique. COMPARISON:  10/23/2020 FINDINGS: Brain: No evidence of acute infarction, hemorrhage, hydrocephalus, extra-axial collection or mass lesion/mass effect. Vascular: No hyperdense vessel or unexpected calcification. Skull: Normal. Negative for fracture or focal lesion. Sinuses/Orbits: No acute finding. IMPRESSION: Negative head CT.  No explanation for headache. Electronically Signed   By: Tiburcio Pea M.D.   On: 03/12/2023 05:14    Assessment/Plan:   Elevated LFTs-chronically elevated alkaline phosphatase with elevated GGT, negative AMA. Isoenzymes suggests liver origin. Extensive evaluation as outline above. Son with elevated LFTs, undergoing work up at [facility].  -AFP tumor marker, alkaline phosphatase isoenzymes, GGT, tTG-IgA, IgG/IgA/IgM, alpha-1 antitrypsin, ceruloplasmin Constipation  -Decline in the setting of  acute illness  -Start Linzess 145 mcg daily as needed.  Samples and prescription provided. IDA  -Colonoscopy/EGD up-to-date  -No overt GI bleeding -update CBC, iron/TIBC/ferritin     Leanna Battles. Melvyn Neth, MHS, PA-C [facility]

## 2023-03-19 NOTE — Patient Instructions (Addendum)
Start Linzess daily before a meal for constipation. Samples provided and RX sent to pharmacy. Complete labs at [facility]. Based on results we may need further imaging of your liver and liver biopsy. Do not drink alcohol or use any over the counter herbal medication. It is so very important that you work with your diabetes doctor to control your diabetes.

## 2023-03-19 NOTE — Transitions of Care (Post Inpatient/ED Visit) (Signed)
03/19/2023  Name: Amane Mangone MRN: [id number] DOB: 1976-04-12  Today's TOC FU Call Status: Today's TOC FU Call Status:: Unsuccessful Call (1st Attempt) Unsuccessful Call (1st Attempt) Date: 03/19/23  Attempted to reach the patient regarding the most recent Inpatient/ED visit.  Follow Up Plan: Additional outreach attempts will be made to reach the patient to complete the Transitions of Care (Post Inpatient/ED visit) call.   Alyse Low, RN, BA, [facility], CRRN [facility] Coordinator, Transition of Care Ph # [phone]

## 2023-03-20 DIAGNOSIS — H5203 Hypermetropia, bilateral: Secondary | ICD-10-CM | POA: Diagnosis not present

## 2023-03-20 DIAGNOSIS — H5213 Myopia, bilateral: Secondary | ICD-10-CM | POA: Diagnosis not present

## 2023-03-21 ENCOUNTER — Telehealth: Payer: Self-pay | Admitting: Nurse Practitioner

## 2023-03-21 NOTE — Telephone Encounter (Signed)
Have her increase her meal time insulin to 10-16 units SQ TID with meals and continue her Lantus 20 units SQ nightly.  It may take a few days but hopefully her glucose will start coming down.

## 2023-03-21 NOTE — Telephone Encounter (Signed)
I called her and told her that you pulled her readings and she did not need to bring her CGM by. She said call her and leave a VM with any changes

## 2023-03-21 NOTE — Telephone Encounter (Signed)
Spoke to pt and sent new instructions to daughter.

## 2023-03-21 NOTE — Telephone Encounter (Signed)
Ok, we can give her another one with Lantus 40 units SQ nightly and Novolog 14-20 units TID

## 2023-03-21 NOTE — Telephone Encounter (Signed)
Patient called with high readings. She will bring her CGM by for [place] to pull  Thank you

## 2023-03-21 NOTE — Telephone Encounter (Signed)
Pts Lantus is 30 units at bedtime and Novolog 10-16 units tidac from a previous time she called in Sept. She confirmed this is what she has been taking. She did state she is coming by here. She wants a new insulin instruction forms that she gets at her visits.

## 2023-03-23 LAB — IRON,TIBC AND FERRITIN PANEL
%SAT: 17 % (ref 16–45)
Ferritin: 52 ng/mL (ref 16–232)
Iron: 59 ug/dL (ref 40–190)
TIBC: 344 ug/dL (ref 250–450)

## 2023-03-23 LAB — CBC WITH DIFFERENTIAL/PLATELET
Absolute Monocytes: 570 {cells}/uL (ref 200–950)
Basophils Absolute: 57 {cells}/uL (ref 0–200)
Basophils Relative: 0.6 %
Eosinophils Absolute: 238 {cells}/uL (ref 15–500)
Eosinophils Relative: 2.5 %
HCT: 37.2 % (ref 35.0–45.0)
Hemoglobin: 12.3 g/dL (ref 11.7–15.5)
Lymphs Abs: 1729 {cells}/uL (ref 850–3900)
MCH: 30 pg (ref 27.0–33.0)
MCHC: 33.1 g/dL (ref 32.0–36.0)
MCV: 90.7 fL (ref 80.0–100.0)
MPV: 10.9 fL (ref 7.5–12.5)
Monocytes Relative: 6 %
Neutro Abs: 6907 {cells}/uL (ref 1500–7800)
Neutrophils Relative %: 72.7 %
Platelets: 238 10*3/uL (ref 140–400)
RBC: 4.1 10*6/uL (ref 3.80–5.10)
RDW: 12.3 % (ref 11.0–15.0)
Total Lymphocyte: 18.2 %
WBC: 9.5 10*3/uL (ref 3.8–10.8)

## 2023-03-23 LAB — AFP TUMOR MARKER: AFP-Tumor Marker: 4 ng/mL

## 2023-03-23 LAB — ALKALINE PHOSPHATASE ISOENZYMES
Alkaline phosphatase (APISO): 216 U/L — ABNORMAL HIGH (ref 31–125)
Bone Isoenzymes: 22 % — ABNORMAL LOW (ref 28–66)
Intestinal Isoenzymes: 0 % — ABNORMAL LOW (ref 1–24)
Liver Isoenzymes: 60 % (ref 25–69)
Macrohepatic isoenzymes: 18 % — ABNORMAL HIGH (ref <=0)
Placental isoenzymes: 0 % (ref <=0)

## 2023-03-23 LAB — TISSUE TRANSGLUTAMINASE, IGA: (tTG) Ab, IgA: <1 U/mL

## 2023-03-23 LAB — IGG, IGA, IGM
IgG (Immunoglobin G), Serum: 1259 mg/dL (ref 600–1640)
IgM, Serum: 138 mg/dL (ref 50–300)
Immunoglobulin A: 243 mg/dL (ref 47–310)

## 2023-03-23 LAB — CERULOPLASMIN: Ceruloplasmin: 41 mg/dL (ref 14–48)

## 2023-03-23 LAB — GAMMA GT: GGT: 188 U/L — ABNORMAL HIGH (ref 3–55)

## 2023-03-23 LAB — ALPHA-1-ANTITRYPSIN: A-1 Antitrypsin, Ser: 153 mg/dL (ref 83–199)

## 2023-03-25 ENCOUNTER — Ambulatory Visit: Payer: Medicaid Other | Admitting: Family Medicine

## 2023-03-25 ENCOUNTER — Telehealth: Payer: Self-pay

## 2023-03-25 VITALS — BP 152/88 | HR 100 | Temp 98.6°F | Ht 66.0 in | Wt 181.2 lb

## 2023-03-25 DIAGNOSIS — I10 Essential (primary) hypertension: Secondary | ICD-10-CM

## 2023-03-25 DIAGNOSIS — Z794 Long term (current) use of insulin: Secondary | ICD-10-CM

## 2023-03-25 DIAGNOSIS — E1129 Type 2 diabetes mellitus with other diabetic kidney complication: Secondary | ICD-10-CM

## 2023-03-25 DIAGNOSIS — R29898 Other symptoms and signs involving the musculoskeletal system: Secondary | ICD-10-CM | POA: Diagnosis not present

## 2023-03-25 DIAGNOSIS — R519 Headache, unspecified: Secondary | ICD-10-CM | POA: Insufficient documentation

## 2023-03-25 DIAGNOSIS — Z23 Encounter for immunization: Secondary | ICD-10-CM

## 2023-03-25 DIAGNOSIS — R809 Proteinuria, unspecified: Secondary | ICD-10-CM

## 2023-03-25 MED ORDER — AMLODIPINE BESYLATE 5 MG PO TABS: 5.0000 mg | ORAL_TABLET | Freq: Every day | ORAL | 1 refills | Status: DC

## 2023-03-25 NOTE — Patient Instructions (Signed)
Medication as prescribed.  Follow up in 1 month  Referral placed.

## 2023-03-25 NOTE — Transitions of Care (Post Inpatient/ED Visit) (Signed)
03/25/2023  Name: Anne Carney MRN: [id number] DOB: September 04, 1975  Today's TOC FU Call Status: Today's TOC FU Call Status:: Successful TOC FU Call Completed TOC FU Call Complete Date: 03/25/23 Patient's Name and Date of Birth confirmed.  Transition Care Management Follow-up Telephone Call Date of Discharge: 03/15/23 Discharge Facility: Other [facility]) Name of Other (Non[facility]) Discharge Facility: [facility] Type of Discharge: Inpatient Admission Primary Inpatient Discharge Diagnosis:: CVA How have you been since you were released from the hospital?: Better Any questions or concerns?: No  Items Reviewed: Did you receive and understand the discharge instructions provided?: Yes Medications obtained,verified, and reconciled?: Yes (Medications Reviewed) Any new allergies since your discharge?: No Dietary orders reviewed?: No Do you have support at home?: Yes People in Home: child(ren), adult Name of Support/Comfort Primary Source: daughter  Medications Reviewed Today: Medications Reviewed Today     Reviewed by Marcos Eke, RN (Registered Nurse) on 03/25/23 at 1246  Med List Status: <None>   Medication Order Taking? Sig Documenting Provider Last Dose Status Informant  amLODipine (NORVASC) 5 MG tablet [id number] Yes Take 1 tablet (5 mg total) by mouth daily. [name], DO Taking Active   blood glucose meter kit and supplies KIT [id number]  Dispense based on patient and insurance preference. Use to check sugar up ro four times daily as directed. Vassie Loll, MD  Active   Continuous Glucose Sensor (FREESTYLE LIBRE 3 PLUS SENSOR) [place] [id number] Yes Change sensor every 15 days. Dani Gobble, NP Taking Active   Continuous Glucose Sensor (FREESTYLE LIBRE 3 PLUS SENSOR) MISC [id number] Yes Change sensor every 15 days. Dani Gobble, NP Taking Active   furosemide (LASIX) 20 MG tablet [id number] Yes Take 20 mg by mouth daily as needed. [provider] Taking Active Self  gabapentin (NEURONTIN) 100 MG capsule [id number] Yes Take 1 capsule (100 mg total) by mouth 2 (two) times daily.  Patient taking differently: Take 200 mg by mouth 2 (two) times daily. Take 2 100 mg tablets twice daily   Vassie Loll, MD Taking Active            Med Note Larose Hires M   Wed Jan 16, 2023  2:02 PM) Patient states that she taked one by mouth at bedtime  ibuprofen (ADVIL) 800 MG tablet [id number] Yes Take 1 tablet (800 mg total) by mouth every 8 (eight) hours as needed for headache. Bethann Berkshire, MD Taking Active   insulin aspart (NOVOLOG FLEXPEN) 100 UNIT/ML FlexPen [id number] Yes Inject 6-12 Units into the skin 3 (three) times daily with meals.  Patient taking differently: Inject 15-20 Units into the skin 3 (three) times daily with meals.   Dani Gobble, NP Taking Active   insulin glargine (LANTUS) 100 unit/mL SOPN [id number] Yes Inject 30 Units into the skin at bedtime.  Patient taking differently: Inject 40 Units into the skin at bedtime.   Dani Gobble, NP Taking Active   Insulin Pen Needle 32G X 4 MM MISC [id number] Yes Use up to 5 times daily to administer insulin. [name], DO Taking Active   Iron, Ferrous Sulfate, 325 (65 Fe) MG TABS [id number] Yes Take 325 mg by mouth every other day. [name], DO Taking Active   linaclotide Karlene Einstein) 145 MCG CAPS capsule [id number] Yes Take 1 capsule (145 mcg total) by mouth daily before breakfast. [name], PA-C Taking Active   lisinopril-hydrochlorothiazide (ZESTORETIC) 10-12.5 MG tablet [id number] Yes Take 1 tablet by mouth daily.  [name], DO Taking Active            Med Note Larose Hires M   Wed Jan 16, 2023  2:03 PM) Patient states that she nay take 2 per week  meclizine (ANTIVERT) 25 MG tablet [id number] Yes Take 1 tablet (25 mg total) by mouth 3 (three) times daily as needed for dizziness. Bethann Berkshire, MD Taking Active   ondansetron (ZOFRAN-ODT) 4 MG disintegrating  tablet [id number] Yes 4mg  ODT q4 hours prn nausea/vomit Bethann Berkshire, MD Taking Active             Home Care and Equipment/Supplies: Were Home Health Services Ordered?: No Any new equipment or medical supplies ordered?: No  Functional Questionnaire: Do you need assistance with bathing/showering or dressing?: No Do you need assistance with meal preparation?: No Do you need assistance with eating?: No Do you have difficulty maintaining continence: No Do you need assistance with getting out of bed/getting out of a chair/moving?: No Do you have difficulty managing or taking your medications?: No  Follow up appointments reviewed: PCP Follow-up appointment confirmed?: Yes Date of PCP follow-up appointment?: 03/25/23 Follow-up Provider: Dr. Everlene Other (received flu shot as well as reviewed hospitalization) Specialist Hospital Follow-up appointment confirmed?: Yes Date of Specialist follow-up appointment?: 04/18/23 Follow-Up Specialty Provider:: Endocrinology Ronny Bacon) regarding Diabetes follow up and A1c lab draw Do you need transportation to your follow-up appointment?: No Do you understand care options if your condition(s) worsen?: Yes-patient verbalized understanding    Alyse Low, RN, BA, [facility], CRRN [facility] Population Health Care Management Coordinator, Transition of Care Ph # [phone]

## 2023-03-25 NOTE — Assessment & Plan Note (Signed)
Referring to neurology for evaluation. This is atypical for her.

## 2023-03-25 NOTE — Assessment & Plan Note (Signed)
Uncontrolled. Recent HHS. Insulin has been increased by Endo. Needs close follow up.

## 2023-03-25 NOTE — Progress Notes (Signed)
Subjective:  Patient ID: Anne Carney, female    DOB: 1975/07/09  Age: 47 y.o. MRN: [id number]  CC: Hospital follow up  HPI:  47 year old female with uncontrolled DM-2, HTN, HLD presents for hospital follow up.  Was seen in the ER @ [facility] on 10/1 and was discharged home.  Admitted to [facility] after presenting with headache and AMS. Admitted from 10/2 - 10/4. Diagnosed with HHS and treated with fluids and insuiln ggt. Neuroimaging negative regarding headache. Had AKI as well.  Presents today for follow up. Feeling better but still having intermittent frontal headache. Endorses that she has had LUE weakness as well. Neuroimaging was negative. She has reached out to Endo and Lantus and Novolog have been increased.   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) 12/28/2022   Proteinuria 10/05/2022   Hyperlipidemia 10/05/2022   Elevated alkaline phosphatase level 01/26/2022   Elevated LFTs 11/28/2021   Gastroesophageal reflux disease    Primary hypertension    DM type 2 (diabetes mellitus, type 2) (HCC) 11/21/2015    Social Hx   Social History   Socioeconomic History   Marital status: Divorced    Spouse name: Not on file   Number of children: Not on file   Years of education: Not on file   Highest education level: Not on file  Occupational History   Not on file  Tobacco Use   Smoking status: Never   Smokeless tobacco: Never  Substance and Sexual Activity   Alcohol use: Yes    Comment: occ.   Drug use: Not Currently    Types: Marijuana    Comment: occasionally   Sexual activity: Yes    Birth control/protection: None  Other Topics Concern   Not on file  Social History Narrative   Not on file   Social Determinants of Health   Financial Resource Strain: Not on file  Food Insecurity: No Food Insecurity (03/25/2023)    Hunger Vital Sign    Worried About Running Out of Food in the Last Year: Never true    Ran Out of Food in the Last Year: Never true  Transportation Needs: No Transportation Needs (03/25/2023)   PRAPARE - Administrator, Civil Service (Medical): No    Lack of Transportation (Non-Medical): No  Physical Activity: Not on file  Stress: Not on file  Social Connections: Not on file    Review of Systems Per HPI  Objective:  BP (!) 152/88   Pulse 100   Temp 98.6 F (37 C) (Oral)   Ht 5\' 6"  (1.676 m)   Wt 181 lb 3.2 oz (82.2 kg)   LMP 03/12/2023 (Approximate)   SpO2 100%   BMI 29.25 kg/m      03/25/2023   11:44 AM 03/25/2023   11:21 AM 03/19/2023    8:47 AM  BP/Weight  Systolic BP 152 162 124  Diastolic BP 88 101 80  Wt. (Lbs)  181.2   BMI  29.25 kg/m2     Physical Exam Vitals and nursing note reviewed.  Constitutional:      General: She is not in acute distress.    Appearance: Normal appearance.  HENT:     Head: Normocephalic and atraumatic.  Eyes:     General:        Right eye: No discharge.  Left eye: No discharge.     Conjunctiva/sclera: Conjunctivae normal.  Cardiovascular:     Rate and Rhythm: Normal rate and regular rhythm.  Pulmonary:     Effort: Pulmonary effort is normal.     Breath sounds: Normal breath sounds.  Neurological:     General: No focal deficit present.     Mental Status: She is alert.  Psychiatric:        Mood and Affect: Mood normal.        Behavior: Behavior normal.     Lab Results  Component Value Date   WBC 9.5 03/19/2023   HGB 12.3 03/19/2023   HCT 37.2 03/19/2023   PLT 238 03/19/2023   GLUCOSE 284 (H) 03/12/2023   CHOL 210 (H) 10/04/2022   TRIG 49 10/04/2022   HDL 88 10/04/2022   LDLCALC 108 (H) 10/04/2022   ALT 32 03/12/2023   AST 40 03/12/2023   NA 131 (L) 03/12/2023   K 4.5 03/12/2023   CL 95 (L) 03/12/2023   CREATININE 1.15 (H) 03/12/2023   BUN 29 (H) 03/12/2023   CO2 21 (L) 03/12/2023    HGBA1C 8.7 (A) 01/16/2023   MICROALBUR 17.2 10/04/2022     Assessment & Plan:   Problem List Items Addressed This Visit       Cardiovascular and Mediastinum   Primary hypertension - Primary    Uncontrolled. Adding Amlodipine.      Relevant Medications   amLODipine (NORVASC) 5 MG tablet     Endocrine   DM type 2 (diabetes mellitus, type 2) (HCC)    Uncontrolled. Recent HHS. Insulin has been increased by Endo. Needs close follow up.        Other   Acute nonintractable headache    Referring to neurology for evaluation. This is atypical for her.       Relevant Medications   amLODipine (NORVASC) 5 MG tablet   Other Relevant Orders   Ambulatory referral to Neurology   Other Visit Diagnoses     Needs flu shot       Relevant Orders   Flu vaccine trivalent PF, 6mos and older(Flulaval,Afluria,Fluarix,Fluzone) (Completed)   Left arm weakness       Relevant Orders   Ambulatory referral to Neurology       Meds ordered this encounter  Medications   amLODipine (NORVASC) 5 MG tablet    Sig: Take 1 tablet (5 mg total) by mouth daily.    Dispense:  90 tablet    Refill:  1    Follow-up:  1 month  Syrenity Klepacki DO [facility]

## 2023-03-25 NOTE — Assessment & Plan Note (Signed)
Uncontrolled. Adding Amlodipine.

## 2023-04-01 ENCOUNTER — Other Ambulatory Visit: Payer: Self-pay | Admitting: *Deleted

## 2023-04-01 DIAGNOSIS — R748 Abnormal levels of other serum enzymes: Secondary | ICD-10-CM

## 2023-04-02 ENCOUNTER — Encounter (HOSPITAL_COMMUNITY): Payer: Self-pay

## 2023-04-02 ENCOUNTER — Ambulatory Visit (HOSPITAL_COMMUNITY)
Admission: RE | Admit: 2023-04-02 | Discharge: 2023-04-02 | Disposition: A | Discharge status: Home / Self Care | Payer: Medicaid Other | Source: Ambulatory Visit | Attending: Gastroenterology | Admitting: Gastroenterology

## 2023-04-02 ENCOUNTER — Other Ambulatory Visit: Payer: Self-pay | Admitting: Gastroenterology

## 2023-04-02 DIAGNOSIS — R748 Abnormal levels of other serum enzymes: Secondary | ICD-10-CM | POA: Insufficient documentation

## 2023-04-02 MED ORDER — GADOBUTROL 1 MMOL/ML IV SOLN
8.0000 mL | Freq: Once | INTRAVENOUS | Status: AC | PRN
  Administered 2023-04-02: 8 mL via INTRAVENOUS

## 2023-04-03 ENCOUNTER — Telehealth: Payer: Self-pay | Admitting: Nurse Practitioner

## 2023-04-03 ENCOUNTER — Other Ambulatory Visit: Payer: Self-pay | Admitting: Nurse Practitioner

## 2023-04-03 MED ORDER — CONTOUR NEXT TEST VI STRP: ORAL_STRIP | 6 refills | Status: DC

## 2023-04-03 NOTE — Telephone Encounter (Signed)
done

## 2023-04-03 NOTE — Telephone Encounter (Signed)
Pt needs strips called in for her [employer] meter. [facility] [place]

## 2023-04-16 ENCOUNTER — Telehealth: Payer: Self-pay | Admitting: Nurse Practitioner

## 2023-04-16 DIAGNOSIS — Z794 Long term (current) use of insulin: Secondary | ICD-10-CM

## 2023-04-16 NOTE — Telephone Encounter (Signed)
Pt's insurance called and said the pt called them and said she was suppose to be getting signed up for a diet class and has not heard from anyone. I did not see a referral for [facility], if that is what she is referring to. Please Advise

## 2023-04-16 NOTE — Telephone Encounter (Signed)
Not sure, I can enter a referral for [facility] though.

## 2023-04-18 ENCOUNTER — Ambulatory Visit (INDEPENDENT_AMBULATORY_CARE_PROVIDER_SITE_OTHER): Payer: Medicaid Other | Admitting: Nurse Practitioner

## 2023-04-18 ENCOUNTER — Encounter: Payer: Self-pay | Admitting: Nurse Practitioner

## 2023-04-18 VITALS — BP 140/66 | HR 105 | Ht 66.0 in | Wt 182.4 lb

## 2023-04-18 DIAGNOSIS — E782 Mixed hyperlipidemia: Secondary | ICD-10-CM

## 2023-04-18 DIAGNOSIS — I10 Essential (primary) hypertension: Secondary | ICD-10-CM

## 2023-04-18 DIAGNOSIS — E11649 Type 2 diabetes mellitus with hypoglycemia without coma: Secondary | ICD-10-CM | POA: Diagnosis not present

## 2023-04-18 DIAGNOSIS — Z794 Long term (current) use of insulin: Secondary | ICD-10-CM | POA: Diagnosis not present

## 2023-04-18 DIAGNOSIS — Z7985 Long-term (current) use of injectable non-insulin antidiabetic drugs: Secondary | ICD-10-CM | POA: Diagnosis not present

## 2023-04-18 MED ORDER — NOVOLOG FLEXPEN 100 UNIT/ML ~~LOC~~ SOPN: 14.0000 [IU] | PEN_INJECTOR | Freq: Three times a day (TID) | SUBCUTANEOUS | 3 refills | Status: DC

## 2023-04-18 MED ORDER — INSULIN GLARGINE 100 UNITS/ML SOLOSTAR PEN: 30.0000 [IU] | PEN_INJECTOR | Freq: Every day | SUBCUTANEOUS | 6 refills | Status: DC

## 2023-04-18 MED ORDER — FREESTYLE LIBRE 3 PLUS SENSOR MISC: 3 refills | Status: DC

## 2023-04-18 NOTE — Progress Notes (Signed)
Endocrinology Follow Up Note       04/18/2023, 4:13 PM   Subjective:    Patient ID: Anne Carney, female    DOB: 1976/02/24.  Anne Carney is being seen in follow up after being seen in consultation for management of currently uncontrolled symptomatic diabetes requested by  [name], DO.   Past Medical History:  Diagnosis Date   Diabetes mellitus    Hypertension    Kidney disease    Neuropathy     Past Surgical History:  Procedure Laterality Date   BIOPSY  09/25/2021   Procedure: BIOPSY;  Surgeon: Lanelle Bal, DO;  [facility];  Service: Endoscopy;;   CESAREAN SECTION     COLONOSCOPY WITH PROPOFOL N/A 09/25/2021   Procedure: COLONOSCOPY WITH PROPOFOL;  Surgeon: Lanelle Bal, DO;  [facility];  Service: Endoscopy;  Laterality: N/A;  9:30am, ASA 3   COLONOSCOPY WITH PROPOFOL N/A 04/24/2022   Procedure: COLONOSCOPY WITH PROPOFOL;  Surgeon: Lanelle Bal, DO;  [facility];  Service: Endoscopy;  Laterality: N/A;  12:00pm, asa 2   ESOPHAGOGASTRODUODENOSCOPY (EGD) WITH PROPOFOL N/A 09/25/2021   Procedure: ESOPHAGOGASTRODUODENOSCOPY (EGD) WITH PROPOFOL;  Surgeon: Lanelle Bal, DO;  [facility];  Service: Endoscopy;  Laterality: N/A;   POLYPECTOMY  04/24/2022   Procedure: POLYPECTOMY INTESTINAL;  Surgeon: Lanelle Bal, DO;  [facility];  Service: Endoscopy;;   TOE SURGERY Right    TOE SURGERY Left     Social History   Socioeconomic History   Marital status: Divorced    Spouse name: Not on file   Number of children: Not on file   Years of education: Not on file   Highest education level: Not on file  Occupational History   Not on file  Tobacco Use   Smoking status: Never   Smokeless tobacco: Never  Substance and Sexual Activity   Alcohol use: Yes    Comment: occ.   Drug use: Not Currently    Types: Marijuana     Comment: occasionally   Sexual activity: Yes    Birth control/protection: None  Other Topics Concern   Not on file  Social History Narrative   Not on file   Social Determinants of Health   Financial Resource Strain: Not on file  Food Insecurity: No Food Insecurity (03/25/2023)   Hunger Vital Sign    Worried About Running Out of Food in the Last Year: Never true    Ran Out of Food in the Last Year: Never true  Transportation Needs: No Transportation Needs (03/25/2023)   PRAPARE - Administrator, Civil Service (Medical): No    Lack of Transportation (Non-Medical): No  Physical Activity: Not on file  Stress: Not on file  Social Connections: Not on file    Family History  Problem Relation Age of Onset   Huntington's disease Father    Asthma Mother    Diabetes Daughter     Outpatient Encounter Medications as of 04/18/2023  Medication Sig   amLODipine (NORVASC) 5 MG tablet Take 1 tablet (5 mg total) by mouth daily.   blood glucose meter kit and  supplies KIT Dispense based on patient and insurance preference. Use to check sugar up ro four times daily as directed.   Continuous Glucose Sensor (FREESTYLE LIBRE 3 PLUS SENSOR) MISC Change sensor every 15 days.   furosemide (LASIX) 20 MG tablet Take 20 mg by mouth daily as needed.   gabapentin (NEURONTIN) 100 MG capsule Take 1 capsule (100 mg total) by mouth 2 (two) times daily. (Patient taking differently: Take 200 mg by mouth 2 (two) times daily. Take 2 100 mg tablets twice daily)   glucose blood (CONTOUR NEXT TEST) test strip Use as instructed to monitor glucose 4 times daily.   ibuprofen (ADVIL) 800 MG tablet Take 1 tablet (800 mg total) by mouth every 8 (eight) hours as needed for headache.   Insulin Pen Needle 32G X 4 MM MISC Use up to 5 times daily to administer insulin.   Iron, Ferrous Sulfate, 325 (65 Fe) MG TABS Take 325 mg by mouth every other day.   linaclotide (LINZESS) 145 MCG CAPS capsule Take 1 capsule (145 mcg  total) by mouth daily before breakfast.   lisinopril-hydrochlorothiazide (ZESTORETIC) 10-12.5 MG tablet Take 1 tablet by mouth daily.   meclizine (ANTIVERT) 25 MG tablet Take 1 tablet (25 mg total) by mouth 3 (three) times daily as needed for dizziness.   ondansetron (ZOFRAN-ODT) 4 MG disintegrating tablet 4mg  ODT q4 hours prn nausea/vomit   [DISCONTINUED] Continuous Glucose Sensor (FREESTYLE LIBRE 3 PLUS SENSOR) MISC Change sensor every 15 days.   [DISCONTINUED] insulin aspart (NOVOLOG FLEXPEN) 100 UNIT/ML FlexPen Inject 6-12 Units into the skin 3 (three) times daily with meals. (Patient taking differently: Inject 15-20 Units into the skin 3 (three) times daily with meals.)   [DISCONTINUED] insulin glargine (LANTUS) 100 unit/mL SOPN Inject 30 Units into the skin at bedtime. (Patient taking differently: Inject 40 Units into the skin at bedtime.)   Continuous Glucose Sensor (FREESTYLE LIBRE 3 PLUS SENSOR) MISC Change sensor every 15 days.   insulin aspart (NOVOLOG FLEXPEN) 100 UNIT/ML FlexPen Inject 14-20 Units into the skin 3 (three) times daily with meals.   insulin glargine (LANTUS) 100 unit/mL SOPN Inject 30 Units into the skin at bedtime.   No facility-administered encounter medications on file as of 04/18/2023.    ALLERGIES: No Known Allergies  VACCINATION STATUS: Immunization History  Administered Date(s) Administered   Influenza, Seasonal, Injecte, Preservative Fre 03/25/2023   Influenza,inj,Quad PF,6+ Mos 04/02/2018   Influenza,trivalent, recombinat, inj, PF 04/14/2017   PPD Test 10/16/2022   Pneumococcal Polysaccharide-23 11/22/2015    Diabetes She presents for her follow-up diabetic visit. She has type 2 diabetes mellitus. Onset time: diagnosed at approx age of 89. Her disease course has been fluctuating. There are no hypoglycemic associated symptoms. Associated symptoms include fatigue and foot paresthesias. There are no hypoglycemic complications. Diabetic complications include  PVD (hx partial toe amputation both feet). Risk factors for coronary artery disease include diabetes mellitus, dyslipidemia, family history and hypertension. Current diabetic treatment includes intensive insulin program. She is compliant with treatment most of the time. Her weight is fluctuating minimally. She is following a generally unhealthy diet. When asked about meal planning, she reported none. She has not had a previous visit with a dietitian. She participates in exercise intermittently. Her home blood glucose trend is fluctuating dramatically. Her overall blood glucose range is >200 mg/dl. (She presents today, accompanied by her daughter (also my patient), with her CGM showing dramatically fluctuating glycemic profile.  Her most recent A1c on 10/2 was 9.7%, increasing slightly  from last visit of 8.7%.  She admits her diet is not the best but she is making positive changes, scheduled to see the nutritionist in December.  Analysis of her CGM shows TIR 40%, TAR 53%, TBR 7% with a GMI of 8.3%.  She has been hospitalized since last visit for high glucose.) An ACE inhibitor/angiotensin II receptor blocker is being taken. She does not see a podiatrist.Eye exam is current.     Review of systems  Constitutional: + Minimally fluctuating body weight,  current Body mass index is 29.44 kg/m. , no fatigue, no subjective hyperthermia, no subjective hypothermia Eyes: no blurry vision, no xerophthalmia ENT: no sore throat, no nodules palpated in throat, no dysphagia/odynophagia, no hoarseness Cardiovascular: no chest pain, no shortness of breath, no palpitations, + leg swelling Respiratory: no cough, no shortness of breath Gastrointestinal: no nausea/vomiting/diarrhea Musculoskeletal: no muscle/joint aches Skin: no rashes, no hyperemia Neurological: no tremors, no numbness, no tingling, no dizziness Psychiatric: no depression, no anxiety  Objective:     BP (!) 140/66 (BP Location: Left Arm, Patient  Position: Sitting, Cuff Size: Large)   Pulse (!) 105   Ht 5\' 6"  (1.676 m)   Wt 182 lb 6.4 oz (82.7 kg)   LMP 03/12/2023 (Approximate)   BMI 29.44 kg/m   Wt Readings from Last 3 Encounters:  04/18/23 182 lb 6.4 oz (82.7 kg)  03/25/23 181 lb 3.2 oz (82.2 kg)  03/19/23 174 lb 9.6 oz (79.2 kg)     BP Readings from Last 3 Encounters:  04/18/23 (!) 140/66  03/25/23 (!) 152/88  03/19/23 124/80      Physical Exam- Limited  Constitutional:  Body mass index is 29.44 kg/m. , not in acute distress, normal state of mind Eyes:  EOMI, no exophthalmos Musculoskeletal: no gross deformities, strength intact in all four extremities, no gross restriction of joint movements Skin:  no rashes, no hyperemia Neurological: no tremor with outstretched hands   Diabetic Foot Exam - Simple   Simple Foot Form Diabetic Foot exam was performed with the following findings: Yes 04/18/2023  3:43 PM  Visual Inspection No deformities, no ulcerations, no other skin breakdown bilaterally: Yes Sensation Testing Intact to touch and monofilament testing bilaterally: Yes Pulse Check Posterior Tibialis and Dorsalis pulse intact bilaterally: Yes Comments Missing tips of second toes on bilateral feet, onychomycosis bilaterally, nails in need of trim.  Excoriation to right ankle from scratching.     CMP ( most recent) CMP     Component Value Date/Time   NA 131 (L) 03/12/2023 0644   NA 132 (L) 09/04/2021 1456   K 4.5 03/12/2023 0644   CL 95 (L) 03/12/2023 0644   CO2 21 (L) 03/12/2023 0644   GLUCOSE 284 (H) 03/12/2023 0644   BUN 29 (H) 03/12/2023 0644   BUN 21 09/04/2021 1456   CREATININE 1.15 (H) 03/12/2023 0644   CREATININE 0.87 10/19/2022 1139   CALCIUM 8.9 03/12/2023 0644   PROT 7.5 03/12/2023 0644   PROT 6.8 01/11/2022 0916   ALBUMIN 3.6 03/12/2023 0644   ALBUMIN 3.9 01/11/2022 0916   AST 40 03/12/2023 0644   ALT 32 03/12/2023 0644   ALKPHOS 292 (H) 03/12/2023 0644   BILITOT 0.8 03/12/2023 0644    BILITOT 0.5 01/11/2022 0916   EGFR 64 09/04/2021 1456   GFRNONAA 59 (L) 03/12/2023 0644     Diabetic Labs (most recent): Lab Results  Component Value Date   HGBA1C 8.7 (A) 01/16/2023   HGBA1C 13.1 (H) 10/04/2022  HGBA1C 11.5 (H) 09/04/2021   MICROALBUR 17.2 10/04/2022     Lipid Panel ( most recent) Lipid Panel     Component Value Date/Time   CHOL 210 (H) 10/04/2022 1530   TRIG 49 10/04/2022 1530   HDL 88 10/04/2022 1530   CHOLHDL 2.4 10/04/2022 1530   LDLCALC 108 (H) 10/04/2022 1530      No results found for: "TSH", "FREET4"         Assessment & Plan:   1) Type 2 diabetes mellitus with hypoglycemia without coma, with long-term current use of insulin (HCC)  She presents today, accompanied by her daughter (also my patient), with her CGM showing dramatically fluctuating glycemic profile.  Her most recent A1c on 10/2 was 9.7%, increasing slightly from last visit of 8.7%.  She admits her diet is not the best but she is making positive changes, scheduled to see the nutritionist in December.  Analysis of her CGM shows TIR 40%, TAR 53%, TBR 7% with a GMI of 8.3%.  She has been hospitalized since last visit for high glucose.  - Anne Carney has currently uncontrolled symptomatic type 2 DM since 47 years of age.   -Recent labs reviewed.  - I had a long discussion with her about the progressive nature of diabetes and the pathology behind its complications. -her diabetes is complicated by neuropathy, PVD- partial amputation of toe on bilateral feet and she remains at a high risk for more acute and chronic complications which include CAD, CVA, CKD, retinopathy, and neuropathy. These are all discussed in detail with her.  The following Lifestyle Medicine recommendations according to American College of Lifestyle Medicine [facility]) were discussed and offered to patient and she agrees to start the journey:  A. Whole Foods, Plant-based plate comprising of fruits and vegetables,  plant-based proteins, whole-grain carbohydrates was discussed in detail with the patient.   A list for source of those nutrients were also provided to the patient.  Patient will use only water or unsweetened tea for hydration. B.  The need to stay away from risky substances including alcohol, smoking; obtaining 7 to 9 hours of restorative sleep, at least 150 minutes of moderate intensity exercise weekly, the importance of healthy social connections,  and stress reduction techniques were discussed. C.  A full color page of  Calorie density of various food groups per pound showing examples of each food groups was provided to the patient.  - Nutritional counseling repeated at each appointment due to patients tendency to fall back in to old habits.  - The patient admits there is a room for improvement in their diet and drink choices. -  Suggestion is made for the patient to avoid simple carbohydrates from their diet including Cakes, Sweet Desserts / Pastries, Ice Cream, Soda (diet and regular), Sweet Tea, Candies, Chips, Cookies, Sweet Pastries, Store Bought Juices, Alcohol in Excess of 1-2 drinks a day, Artificial Sweeteners, Coffee Creamer, and "Sugar-free" Products. This will help patient to have stable blood glucose profile and potentially avoid unintended weight gain.   - I encouraged the patient to switch to unprocessed or minimally processed complex starch and increased protein intake (animal or plant source), fruits, and vegetables.   - Patient is advised to stick to a routine mealtimes to eat 3 meals a day and avoid unnecessary snacks (to snack only to correct hypoglycemia).  - I have approached her with the following individualized plan to manage her diabetes and patient agrees:   -She is advised to adjust her  Lantus to 35 units SQ nightly and continue Novolog to 10-16 units TID with meals if glucose is above 90 and she is eating (Specific instructions on how to titrate insulin dosage based on  glucose readings given to patient in writing).   -she is encouraged to continue monitoring glucose 4 times daily (using her CGM), before meals and before bed, and to call the clinic if she has readings less than 70 or above 300 for 3 tests in a row.  - she is warned not to take insulin without proper monitoring per orders. - Adjustment parameters are given to her for hypo and hyperglycemia in writing.  - I advised her not to start the Ozempic (says it required PA and PCP hasn't yet completed it).  Due to cost and BMI of near 25, she may not be the best candidate for this type of medication.  - Specific targets for  A1c; LDL, HDL, and Triglycerides were discussed with the patient.  2) Blood Pressure /Hypertension:  her blood pressure is not controlled to target today.   she is advised to continue her current medications including Lisinopril/HCT 10-12.5 mg p.o. daily with breakfast.  3) Lipids/Hyperlipidemia:    Review of her recent lipid panel from 10/04/22 showed uncontrolled LDL at 108 .  she is advised to continue Crestor 20 mg daily at bedtime.  Side effects and precautions discussed with her.  4)  Weight/Diet:  her Body mass index is 29.44 kg/m.  -  she is NOT a candidate for major weight loss. I discussed with her the fact that loss of 5 - 10% of her  current body weight will have the most impact on her diabetes management.  Exercise, and detailed carbohydrates information provided  -  detailed on discharge instructions.  5) Chronic Care/Health Maintenance: -she is on ACEI/ARB and Statin medications and is encouraged to initiate and continue to follow up with Ophthalmology, Dentist, Podiatrist at least yearly or according to recommendations, and advised to stay away from smoking. I have recommended yearly flu vaccine and pneumonia vaccine at least every 5 years; moderate intensity exercise for up to 150 minutes weekly; and sleep for at least 7 hours a day.  - she is advised to maintain  close follow up with [name], DO for primary care needs, as well as her other providers for optimal and coordinated care.     I spent  41  minutes in the care of the patient today including review of labs from CMP, Lipids, Thyroid Function, Hematology (current and previous including abstractions from other facilities); face-to-face time discussing  her blood glucose readings/logs, discussing hypoglycemia and hyperglycemia episodes and symptoms, medications doses, her options of short and long term treatment based on the latest standards of care / guidelines;  discussion about incorporating lifestyle medicine;  and documenting the encounter. Risk reduction counseling performed per USPSTF guidelines to reduce obesity and cardiovascular risk factors.     Please refer to Patient Instructions for Blood Glucose Monitoring and Insulin/Medications Dosing Guide"  in media tab for additional information. Please  also refer to " Patient Self Inventory" in the Media  tab for reviewed elements of pertinent patient history.  Anne Carney participated in the discussions, expressed understanding, and voiced agreement with the above plans.  All questions were answered to her satisfaction. she is encouraged to contact clinic should she have any questions or concerns prior to her return visit.     Follow up plan: - Return in about  3 months (around 07/19/2023) for Diabetes F/U with A1c in office, No previsit labs, Bring meter and logs.   Ronny Bacon, [facility] [address] [place], [place] [address] Phone: [phone] Fax: [phone]  04/18/2023, 4:13 PM

## 2023-04-23 ENCOUNTER — Telehealth: Payer: Self-pay

## 2023-04-23 NOTE — Telephone Encounter (Signed)
Copied from CRM [id number]. Topic: Clinical - Medication Question >> Apr 23, 2023 11:59 AM Prudencio Pair wrote: Reason for CRM: [facility] Delivery) calling about follow up on paperwork that is needed for pt's Diabetic Supplies. CB # [id number].

## 2023-04-23 NOTE — Telephone Encounter (Signed)
Copied from CRM [id number]. Topic: Clinical - Medication Question >> Apr 23, 2023 11:59 AM Prudencio Pair wrote: Reason for CRM: [facility] Delivery) calling about follow up on paperwork that is needed for pt's Diabetic Supplies. CB # [id number].    Information routed to Endocrinology per Dr Adriana Simas

## 2023-04-24 ENCOUNTER — Telehealth: Payer: Self-pay | Admitting: *Deleted

## 2023-04-24 NOTE — Telephone Encounter (Signed)
Talked with the patient, asking her what did she need. She states that she thinks that she needs a prescription sent in for her diabetic supplies. She shared that she has so many phone calls , that she is not sure.

## 2023-04-24 NOTE — Telephone Encounter (Signed)
-----   Message from Dani Gobble sent at 04/23/2023  3:56 PM EST ----- Can we find out what exactly they are needing? ----- Message ----- From: Elizbeth Squires, CMA Sent: 04/23/2023   3:33 PM EST To: Dani Gobble, NP  Copied from CRM [id number]. Topic: Clinical - Medication Question >> Apr 23, 2023 11:59 AM Prudencio Pair wrote: Reason for CRM: [facility] Delivery) calling about follow up on paperwork that is needed for pt's Diabetic Supplies. CB # [id number].

## 2023-05-01 ENCOUNTER — Telehealth: Payer: Self-pay | Admitting: *Deleted

## 2023-05-01 ENCOUNTER — Ambulatory Visit: Payer: Medicaid Other | Admitting: Orthopaedic Surgery

## 2023-05-01 ENCOUNTER — Encounter: Payer: Self-pay | Admitting: Orthopaedic Surgery

## 2023-05-01 DIAGNOSIS — M654 Radial styloid tenosynovitis [de Quervain]: Secondary | ICD-10-CM

## 2023-05-01 MED ORDER — METHYLPREDNISOLONE ACETATE 40 MG/ML IJ SUSP
40.0000 mg | Freq: Once | INTRAMUSCULAR | Status: AC
  Administered 2023-05-01: 40 mg via INTRA_ARTICULAR

## 2023-05-01 NOTE — Progress Notes (Signed)
My wrist is hurting again.  She has recurrence of de quervains on the left.  Procedure note: After permission from the patient, the first extensor compartment was prepped.  I injected 1 % xylocaine and 1 cc DepoMedrol 40 mgm by sterile technique into the first extensor compartment of the left wrist tolerated well.  Encounter Diagnosis  Name Primary?   De Quervain's tenosynovitis, left Yes   Return in two weeks.  Call if any problem.  Precautions discussed.  Electronically Signed Darreld Mclean, MD 11/20/202410:12 AM

## 2023-05-01 NOTE — Telephone Encounter (Unsigned)
Copied from CRM [id number]. Topic: General - Other >> May 01, 2023 12:05 PM Dimitri Ped wrote: Reason for CRM: [facility] [facility] delivery calling about some paperwork that was faxed about medical equipment [phone] there fax number and it was for a blood pressure cup I have attempted to contact this patient by phone with the following results: call back number [phone]

## 2023-05-02 NOTE — Telephone Encounter (Signed)
Maxwell Caul is calling back in regarding pt bp cuff and her diabetes medical supplies. She wants to know if the fax was received.

## 2023-05-02 NOTE — Telephone Encounter (Signed)
Everlene Other G, DO     I have been getting repeated requests for a BP cuff. I presumed it was junk as patient has not requested it.

## 2023-05-06 NOTE — Telephone Encounter (Signed)
Anne Carney     We have not received any faxes for [name]

## 2023-05-08 ENCOUNTER — Telehealth: Payer: Self-pay

## 2023-05-08 ENCOUNTER — Other Ambulatory Visit: Payer: Self-pay

## 2023-05-08 ENCOUNTER — Ambulatory Visit (INDEPENDENT_AMBULATORY_CARE_PROVIDER_SITE_OTHER): Payer: Medicaid Other | Admitting: Allergy & Immunology

## 2023-05-08 ENCOUNTER — Encounter: Payer: Self-pay | Admitting: Allergy & Immunology

## 2023-05-08 VITALS — BP 150/88 | HR 101 | Temp 98.2°F | Ht 63.5 in | Wt 193.6 lb

## 2023-05-08 DIAGNOSIS — L299 Pruritus, unspecified: Secondary | ICD-10-CM

## 2023-05-08 DIAGNOSIS — R062 Wheezing: Secondary | ICD-10-CM

## 2023-05-08 MED ORDER — CETIRIZINE HCL 10 MG PO TABS: 10.0000 mg | ORAL_TABLET | Freq: Two times a day (BID) | ORAL | 0 refills | Status: DC

## 2023-05-08 MED ORDER — FAMOTIDINE 20 MG PO TABS
20.0000 mg | ORAL_TABLET | Freq: Two times a day (BID) | ORAL | 0 refills | Status: DC
Start: 2023-05-08 — End: 2023-10-28

## 2023-05-08 NOTE — Patient Instructions (Addendum)
1. Pruritus - We are going to get some labs to look for weird causes of itching. - We will call you in 1-2 weeks with the results of your testing.  - I do not think that this is related to the contrast agent since you had that one month ago (and it would be well out of your system by then). - Start cetirizine 10mg  twice daily to help with itching. - Start famotidine 10mg  twice daily to help with itching.   2. Wheezing - Lung testing was in the low 70% range and it did not really improve with the albuterol treatment. - Let's start Trelegy one puff once daily.  - Sample and demonstration provided today. - I want to see whether this helps with your shortness of breath and wheezing. - Daily controller medication(s): Trelegy 100/62.5/25 one puff once daily - Prior to physical activity: albuterol 2 puffs 10-15 minutes before physical activity. - Rescue medications: albuterol 4 puffs every 4-6 hours as needed - Asthma control goals:  * Full participation in all desired activities (may need albuterol before activity) * Albuterol use two time or less a week on average (not counting use with activity) * Cough interfering with sleep two time or less a month * Oral steroids no more than once a year * No hospitalizations  3. Return in about 1 week (around 05/15/2023) for ALLERGY TESTING (1-55). You can have the follow up appointment with Dr. Dellis Anes or a Nurse Practicioner (our Nurse Practitioners are excellent and always have Physician oversight!).    Please inform us of any Emergency Department visits, hospitalizations, or changes in symptoms. Call us before going to the ED for breathing or allergy symptoms since we might be able to fit you in for a sick visit. Feel free to contact us anytime with any questions, problems, or concerns.  It was a pleasure to meet you today!  Websites that have reliable patient information: 1. American Academy of Asthma, Allergy, and Immunology:  www.aaaai.org 2. Food Allergy Research and Education (FARE): foodallergy.org 3. Mothers of Asthmatics: http://www.asthmacommunitynetwork.org 4. American College of Allergy, Asthma, and Immunology: www.acaai.org      "Like" [place] on Facebook and Instagram for our latest updates!      A healthy democracy works best when [employer] participate! Make sure you are registered to vote! If you have moved or changed any of your contact information, you will need to get this updated before voting! Scan the QR codes below to learn more!

## 2023-05-08 NOTE — Telephone Encounter (Signed)
Pt called wanting to know if you talked to Dr. Jena Gauss and if she will be scheduled for a biopsy

## 2023-05-08 NOTE — Progress Notes (Signed)
NEW PATIENT  Date of Service/Encounter:  05/08/23  Consult requested by: [name], DO   Assessment:   Pruritus - without clear rash  Wheezing - with spiro in the mid 70% range and no improvement with the albuterol treatment  Complicated past medical history, including a recent fall, elevated LFTs, and a sensation akin to vertigo   Plan/Recommendations:   1. Pruritus - We are going to get some labs to look for weird causes of itching. - We will call you in 1-2 weeks with the results of your testing.  - I do not think that this is related to the contrast agent since you had that one month ago (and it would be well out of your system by then). - Start cetirizine 10mg  twice daily to help with itching. - Start famotidine 10mg  twice daily to help with itching.   2. Wheezing - Lung testing was in the low 70% range and it did not really improve with the albuterol treatment. - Let's start Trelegy one puff once daily.  - Sample and demonstration provided today. - I want to see whether this helps with your shortness of breath and wheezing. - Daily controller medication(s): Trelegy 100/62.5/25 one puff once daily - Prior to physical activity: albuterol 2 puffs 10-15 minutes before physical activity. - Rescue medications: albuterol 4 puffs every 4-6 hours as needed - Asthma control goals:  * Full participation in all desired activities (may need albuterol before activity) * Albuterol use two time or less a week on average (not counting use with activity) * Cough interfering with sleep two time or less a month * Oral steroids no more than once a year * No hospitalizations  3. Return in about 6 weeks (around 06/19/2023). You can have the follow up appointment with Dr. Dellis Anes or a Nurse Practicioner (our Nurse Practitioners are excellent and always have Physician oversight!).    Subjective:   Anne Carney is a 47 y.o. female presenting today for evaluation of  Chief  Complaint  Patient presents with   Pruritus    States that after she had an MRI w/contrast she has been exteremly itchy   Breathing Problem   Wheezing    Anne Carney has a history of the following: 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) 12/28/2022   Proteinuria 10/05/2022   Hyperlipidemia 10/05/2022   Elevated alkaline phosphatase level 01/26/2022   Elevated LFTs 11/28/2021   Gastroesophageal reflux disease    Primary hypertension    DM type 2 (diabetes mellitus, type 2) (HCC) 11/21/2015    History obtained from: chart review and patient.  Discussed the use of AI scribe software for clinical note transcription with the patient and/or guardian, who gave verbal consent to proceed.  Melodye Clingman was referred by Shona Cothron     Anne Carney is a 47 y.o. female presenting for an evaluation of wheezing and itching .  The patient presents with a chief complaint of persistent itching, which began approximately a month ago following an MRI with contrast for her abdomen. The itching is described as severe and involves the entire body, with particular emphasis on the back and ankles. The patient denies any associated hives or significant skin changes, aside from scratches due to the itching. She has not tried any over-the-counter remedies such as Benadryl or  Zyrtec.  In addition to the itching, the patient reports a recent fall that resulted in a head injury. The fall was associated with a severe headache and subsequent loss of consciousness. The patient was evaluated for a potential stroke, but the results were negative. However, the patient plans to seek a second opinion.  The patient also mentions having liver problems, which were identified during the aforementioned MRI. The exact nature of these liver problems is not  specified. The patient also has a history of kidney issues.  The patient also reports a history of asthma, but has not used an inhaler in over a decade. She also reports symptoms consistent with environmental allergies, including burning eyes and a runny nose, which occur year-round.  Lastly, the patient mentions a recent steroid injection in her wrist, which did not provide the expected relief. She also reports a sensation of falling at times. The patient has not tried any treatments for the itching.   Otherwise, there is no history of other atopic diseases, including drug allergies, stinging insect allergies, or contact dermatitis. There is no significant infectious history. Vaccinations are up to date.    Past Medical History: 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) 12/28/2022   Proteinuria 10/05/2022   Hyperlipidemia 10/05/2022   Elevated alkaline phosphatase level 01/26/2022   Elevated LFTs 11/28/2021   Gastroesophageal reflux disease    Primary hypertension    DM type 2 (diabetes mellitus, type 2) (HCC) 11/21/2015    Medication List:  Allergies as of 05/08/2023   No Known Allergies      Medication List        Accurate as of May 08, 2023  4:13 PM. If you have any questions, ask your nurse or doctor.          amLODipine 5 MG tablet Commonly known as: NORVASC Take 1 tablet (5 mg total) by mouth daily.   blood glucose meter kit and supplies Kit Dispense based on patient and insurance preference. Use to check sugar up ro four times daily as directed.   Contour Next Test test strip Generic drug: glucose blood Use as instructed to monitor glucose 4 times daily.   FreeStyle Libre 3 Plus Sensor Misc Change sensor every 15 days.   FreeStyle Libre 3 Plus Sensor Misc Change sensor every 15 days.   furosemide  20 MG tablet Commonly known as: LASIX Take 20 mg by mouth daily as needed.   gabapentin 100 MG capsule Commonly known as: NEURONTIN Take 1 capsule (100 mg total) by mouth 2 (two) times daily. What changed:  how much to take additional instructions   ibuprofen 800 MG tablet Commonly known as: ADVIL Take 1 tablet (800 mg total) by mouth every 8 (eight) hours as needed for headache.   insulin glargine 100 unit/mL Sopn Commonly known as: LANTUS Inject 30 Units into the skin at bedtime.   Insulin Pen Needle 32G X 4 MM Misc Use up to 5 times daily to administer insulin.   Iron (Ferrous Sulfate) 325 (65 Fe) MG Tabs Take 325 mg by mouth every other day.   linaclotide 145 MCG Caps capsule Commonly known as: Linzess Take 1 capsule (145 mcg total) by mouth daily before breakfast.   lisinopril-hydrochlorothiazide 10-12.5 MG tablet Commonly known as: ZESTORETIC Take 1 tablet by mouth daily.   meclizine 25 MG  tablet Commonly known as: ANTIVERT Take 1 tablet (25 mg total) by mouth 3 (three) times daily as needed for dizziness.   NovoLOG FlexPen 100 UNIT/ML FlexPen Generic drug: insulin aspart Inject 14-20 Units into the skin 3 (three) times daily with meals.   ondansetron 4 MG disintegrating tablet Commonly known as: ZOFRAN-ODT 4mg  ODT q4 hours prn nausea/vomit        Birth History: non-contributory  Developmental History: non-contributory  Past Surgical History: Past Surgical History:  Procedure Laterality Date   BIOPSY  09/25/2021   Procedure: BIOPSY;  Surgeon: Lanelle Bal, DO;  [facility];  Service: Endoscopy;;   CESAREAN SECTION     COLONOSCOPY WITH PROPOFOL N/A 09/25/2021   Procedure: COLONOSCOPY WITH PROPOFOL;  Surgeon: Lanelle Bal, DO;  [facility]: [facility];  Service: Endoscopy;  Laterality: N/A;  9:30am, ASA 3   COLONOSCOPY WITH PROPOFOL N/A 04/24/2022   Procedure: COLONOSCOPY WITH PROPOFOL;  Surgeon: Lanelle Bal, DO;  [facility]:  [facility];  Service: Endoscopy;  Laterality: N/A;  12:00pm, asa 2   ESOPHAGOGASTRODUODENOSCOPY (EGD) WITH PROPOFOL N/A 09/25/2021   Procedure: ESOPHAGOGASTRODUODENOSCOPY (EGD) WITH PROPOFOL;  Surgeon: Lanelle Bal, DO;  [facility];  Service: Endoscopy;  Laterality: N/A;   POLYPECTOMY  04/24/2022   Procedure: POLYPECTOMY INTESTINAL;  Surgeon: Lanelle Bal, DO;  [facility]: [facility];  Service: Endoscopy;;   TOE SURGERY Right    TOE SURGERY Left      Family History: Family History  Problem Relation Age of Onset   Asthma Mother    Huntington's disease Father    Asthma Daughter    Diabetes Daughter    Asthma Son      Social History: Anne Carney lives at home with her family.  She lives in a house that is 49 years old.  They have carpeting and vinyl in the main living areas and carpeting in the bedroom.  They have electric heating and central cooling.  There are no animals inside or outside of the home.  There are no dust mite covers on the bedding.  There is no tobacco exposure.  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  There is no fume, chemical, or dust exposure.  She does use a HEPA filter in her home.  She does not live near an interstate or industrial area.   Review of systems otherwise negative other than that mentioned in the HPI.    Objective:   Blood pressure (!) 148/70, pulse (!) 101, temperature 98.2 F (36.8 C), height 5' 3.5" (1.613 m), weight 193 lb 9.6 oz (87.8 kg), SpO2 98%. Body mass index is 33.76 kg/m.     Physical Exam Vitals reviewed.  Constitutional:      Appearance: She is well-developed.  HENT:     Head: Normocephalic and atraumatic.     Right Ear: Tympanic membrane, ear canal and external ear normal. No drainage, swelling or tenderness. Tympanic membrane is not injected, scarred, erythematous, retracted or bulging.     Left Ear: Tympanic membrane, ear canal and external ear normal. No drainage, swelling or  tenderness. Tympanic membrane is not injected, scarred, erythematous, retracted or bulging.     Nose: No nasal deformity, septal deviation, mucosal edema or rhinorrhea.     Right Turbinates: Enlarged, swollen and pale.     Left Turbinates: Enlarged, swollen and pale.     Right Sinus: No maxillary sinus tenderness or frontal sinus tenderness.  Left Sinus: No maxillary sinus tenderness or frontal sinus tenderness.     Mouth/Throat:     Mouth: Mucous membranes are not pale and not dry.     Pharynx: Uvula midline.  Eyes:     General:        Right eye: No discharge.        Left eye: No discharge.     Conjunctiva/sclera: Conjunctivae normal.     Right eye: Right conjunctiva is not injected. No chemosis.    Left eye: Left conjunctiva is not injected. No chemosis.    Pupils: Pupils are equal, round, and reactive to light.  Cardiovascular:     Rate and Rhythm: Normal rate and regular rhythm.     Heart sounds: Normal heart sounds.  Pulmonary:     Effort: Pulmonary effort is normal. No tachypnea, accessory muscle usage or respiratory distress.     Breath sounds: Normal breath sounds. No wheezing, rhonchi or rales.  Chest:     Chest wall: No tenderness.  Abdominal:     Tenderness: There is no abdominal tenderness. There is no guarding or rebound.  Lymphadenopathy:     Head:     Right side of head: No submandibular, tonsillar or occipital adenopathy.     Left side of head: No submandibular, tonsillar or occipital adenopathy.     Cervical: No cervical adenopathy.  Skin:    General: Skin is warm.     Capillary Refill: Capillary refill takes less than 2 seconds.     Coloration: Skin is not pale.     Findings: No abrasion, erythema, petechiae or rash. Rash is not papular, urticarial or vesicular.  Neurological:     Mental Status: She is alert.  Psychiatric:        Behavior: Behavior is cooperative.      Diagnostic studies:    Spirometry: results abnormal (FEV1: 1.65/71%, FVC:  2.04/71%, FEV1/FVC: 81%).    Spirometry consistent with possible restrictive disease. Xopenex four puffs via MDI treatment given in clinic with no improvement.  Allergy Studies: labs sent instead           Malachi Bonds, MD [facility]

## 2023-05-11 ENCOUNTER — Encounter: Payer: Self-pay | Admitting: Allergy & Immunology

## 2023-05-15 ENCOUNTER — Ambulatory Visit: Payer: Medicaid Other | Admitting: Orthopaedic Surgery

## 2023-05-15 ENCOUNTER — Other Ambulatory Visit: Payer: Self-pay | Admitting: *Deleted

## 2023-05-15 DIAGNOSIS — R7989 Other specified abnormal findings of blood chemistry: Secondary | ICD-10-CM

## 2023-05-15 NOTE — Telephone Encounter (Signed)
Order placed and pt will be contacted to set up appt.

## 2023-05-15 NOTE — Telephone Encounter (Addendum)
Yes, she needs an US guided liver biopsy for elevated LFTs.

## 2023-05-16 ENCOUNTER — Telehealth: Payer: Self-pay | Admitting: *Deleted

## 2023-05-16 NOTE — Telephone Encounter (Signed)
Copied from CRM [id number]. Topic: Medical Record Request - Other >> May 15, 2023  3:37 PM Alvino Blood C wrote: Reason for CRM:  DME (monitoring equipment) Alexia Freestone [phone] is requesting additional info be sent to   Carlyn Reichert with [facility] delivered [phone] for the Pt's monitoring system

## 2023-05-17 ENCOUNTER — Other Ambulatory Visit: Payer: Self-pay | Admitting: Family Medicine

## 2023-05-17 ENCOUNTER — Telehealth: Payer: Self-pay | Admitting: Family Medicine

## 2023-05-17 ENCOUNTER — Ambulatory Visit (INDEPENDENT_AMBULATORY_CARE_PROVIDER_SITE_OTHER): Payer: Medicaid Other | Admitting: Allergy & Immunology

## 2023-05-17 DIAGNOSIS — L299 Pruritus, unspecified: Secondary | ICD-10-CM | POA: Diagnosis not present

## 2023-05-17 DIAGNOSIS — J302 Other seasonal allergic rhinitis: Secondary | ICD-10-CM

## 2023-05-17 DIAGNOSIS — R062 Wheezing: Secondary | ICD-10-CM

## 2023-05-17 DIAGNOSIS — J3089 Other allergic rhinitis: Secondary | ICD-10-CM

## 2023-05-17 MED ORDER — TRIAMCINOLONE ACETONIDE 0.1 % EX CREA: 1.0000 | TOPICAL_CREAM | Freq: Two times a day (BID) | CUTANEOUS | 1 refills | Status: DC | PRN

## 2023-05-17 NOTE — Telephone Encounter (Signed)
Patient is requesting prescription for gabapentin 100 mg to sent into [facility] last filled by different provider.

## 2023-05-17 NOTE — Patient Instructions (Addendum)
1. Pruritus - Be sure to get the labs that I ordered when you get a chance.  - Testing today was positive to grasses, weeds, trees, indoor molds, outdoor molds, dog, and cockroach. - Testing to the most common foods was slightly reactive to sesame and cashew.  - You can certainly avoid these if you would like, but they are not too large and can likely be eaten without worrying about having a reaction.  - Continue with cetirizine 10mg  twice daily to help with itching. - Continue with famotidine 10mg  twice daily to help with itching.  - Start triamcinolone 0.1% cream twice daily as needed to the itchiest spots (avoid the face).  2. Wheezing - We will retest your breathing at the next visit. - we are not going to make any changes. - The Trelegy is meant to ne used every day for the best effect.  - Daily controller medication(s): Trelegy 100/62.5/25 one puff once daily - Prior to physical activity: albuterol 2 puffs 10-15 minutes before physical activity. - Rescue medications: albuterol 4 puffs every 4-6 hours as needed - Asthma control goals:  * Full participation in all desired activities (may need albuterol before activity) * Albuterol use two time or less a week on average (not counting use with activity) * Cough interfering with sleep two time or less a month * Oral steroids no more than once a year * No hospitalizations  3. Return in about 3 months (around 08/15/2023). You can have the follow up appointment with Dr. Dellis Anes or a Nurse Practicioner (our Nurse Practitioners are excellent and always have Physician oversight!).    Please inform us of any Emergency Department visits, hospitalizations, or changes in symptoms. Call us before going to the ED for breathing or allergy symptoms since we might be able to fit you in for a sick visit. Feel free to contact us anytime with any questions, problems, or concerns.  It was a pleasure to see you again today!  Websites that have reliable  patient information: 1. American Academy of Asthma, Allergy, and Immunology: www.aaaai.org 2. Food Allergy Research and Education (FARE): foodallergy.org 3. Mothers of Asthmatics: http://www.asthmacommunitynetwork.org 4. American College of Allergy, Asthma, and Immunology: www.acaai.org      "Like" [place] on Facebook and Instagram for our latest updates!      A healthy democracy works best when [employer] participate! Make sure you are registered to vote! If you have moved or changed any of your contact information, you will need to get this updated before voting! Scan the QR codes below to learn more!        Airborne Adult Perc - 05/17/23 1406     Time Antigen Placed 1406    Allergen Manufacturer Waynette Buttery    Location Back    Number of Test 55    1. Control-Buffer 50% Glycerol Negative    2. Control-Histamine 2+    3. [place] 2+    4. [place] Territ[place] Negative    5. Johnson Negative    6. [place] Blue Negative    7. Meadow Fescue Negative    8. Perennial Rye 2+    9. Timothy Negative    10. Ragweed Mix Negative    11. Cocklebur 2+    12. Plantain,  English Negative    13. Baccharis Negative    14. Dog Fennel 2+    15. [place] Thistle 2+    16. Lamb's Quarters 3+    17. Sheep Sorrell Negative    18. Rough  Pigweed Negative    19. Marsh Elder, Rough Negative    20. Mugwort, Common Negative    21. Box, Elder Negative    22. Cedar, red Negative    23. Sweet Gum Negative    24. Pecan Pollen Negative    25. Pine Mix Negative    26. Walnut, Black Pollen 3+    27. Red Mulberry Negative    28. Ash Mix Negative    29. Birch Mix Negative    30. Beech American Negative    31. Cottonwood, [place] 2+    32. Hickory, White 2+    33. Maple Mix Negative    34. Oak, [place] Mix Negative    35. Sycamore Eastern Negative    36. Alternaria Alternata 3+    37. Cladosporium Herbarum Negative    38. Aspergillus Mix Negative    39. Penicillium Mix Negative    40. Bipolaris Sorokiniana  (Helminthosporium) Negative    41. Drechslera Spicifera (Curvularia) Negative    42. Mucor Plumbeus Negative    43. Fusarium Moniliforme Negative    44. Aureobasidium Pullulans (pullulara) Negative    45. Rhizopus Oryzae Negative    46. Botrytis Cinera Negative    47. Epicoccum Nigrum Negative    48. Phoma Betae Negative    49. Dust Mite Mix Negative    50. Cat Hair 10,000 BAU/ml Negative    51.  Dog Epithelia 2+    52. Mixed Feathers Negative    53. Horse Epithelia Negative    54. Cockroach, German 2+    55. Tobacco Leaf Negative             13 Food Perc - 05/17/23 1442       Test Information   Time Antigen Placed 1425    Allergen Manufacturer Waynette Buttery    Location Back    Number of allergen test 13      Food   1. Peanut Negative    2. Soybean Negative    3. Wheat Negative    4. Sesame --   2 x 3   5. Milk, Cow Negative    6. Casein Negative    7. Egg White, Chicken Negative    8. Shellfish Mix Negative    9. Fish Mix Negative    10. Cashew --   4 x 5   11. Walnut Food Negative    12. Almond Negative    13. Hazelnut Negative             Intradermal - 05/17/23 1442     Time Antigen Placed 1445    Allergen Manufacturer Waynette Buttery    Location Arm    Number of Test 9    Control Negative    French Southern Territories Negative    Johnson Negative    Ragweed Mix Negative    Mold 2 2+    Mold 3 2+    Mold 4 4+    Mite Mix Negative    Cat Negative             Reducing Pollen Exposure  The American Academy of Allergy, Asthma and Immunology suggests the following steps to reduce your exposure to pollen during allergy seasons.    Do not hang sheets or clothing out to dry; pollen may collect on these items. Do not mow lawns or spend time around freshly cut grass; mowing stirs up pollen. Keep windows closed at night.  Keep car windows closed while driving. Minimize morning activities outdoors, a time  when pollen counts are usually at their highest. Stay indoors as much as  possible when pollen counts or humidity is high and on windy days when pollen tends to remain in the air longer. Use air conditioning when possible.  Many air conditioners have filters that trap the pollen spores. Use a HEPA room air filter to remove pollen form the indoor air you breathe.  Control of Mold Allergen   Mold and fungi can grow on a variety of surfaces provided certain temperature and moisture conditions exist.  Outdoor molds grow on plants, decaying vegetation and soil.  The major outdoor mold, Alternaria and Cladosporium, are found in very high numbers during hot and dry conditions.  Generally, a late Summer - Fall peak is seen for common outdoor fungal spores.  Rain will temporarily lower outdoor mold spore count, but counts rise rapidly when the rainy period ends.  The most important indoor molds are Aspergillus and Penicillium.  Dark, humid and poorly ventilated basements are ideal sites for mold growth.  The next most common sites of mold growth are the bathroom and the kitchen.  Outdoor (Seasonal) Mold Control  Positive outdoor molds via skin testing: Alternaria, Bipolaris (Helminthsporium), Drechslera (Curvalaria), and Mucor  Use air conditioning and keep windows closed Avoid exposure to decaying vegetation. Avoid leaf raking. Avoid grain handling. Consider wearing a face mask if working in moldy areas.    Indoor (Perennial) Mold Control   Positive indoor molds via skin testing: Aspergillus, Penicillium, Fusarium, Aureobasidium (Pullulara), and Rhizopus  Maintain humidity below 50%. Clean washable surfaces with 5% bleach solution. Remove sources e.g. contaminated carpets.    Control of Dog or Cat Allergen  Avoidance is the best way to manage a dog or cat allergy. If you have a dog or cat and are allergic to dog or cats, consider removing the dog or cat from the home. If you have a dog or cat but don't want to find it a new home, or if your family wants a pet even  though someone in the household is allergic, here are some strategies that may help keep symptoms at bay:  Keep the pet out of your bedroom and restrict it to only a few rooms. Be advised that keeping the dog or cat in only one room will not limit the allergens to that room. Don't pet, hug or kiss the dog or cat; if you do, wash your hands with soap and water. High-efficiency particulate air (HEPA) cleaners run continuously in a bedroom or living room can reduce allergen levels over time. Regular use of a high-efficiency vacuum cleaner or a central vacuum can reduce allergen levels. Giving your dog or cat a bath at least once a week can reduce airborne allergen.  Control of Cockroach Allergen  Cockroach allergen has been identified as an important cause of acute attacks of asthma, especially in urban settings.  There are fifty-five species of cockroach that exist in the [place], however only three, the [place], [place] species produce allergen that can affect patients with Asthma.  Allergens can be obtained from fecal particles, egg casings and secretions from cockroaches.    Remove food sources. Reduce access to water. Seal access and entry points. Spray runways with 0.5-1% Diazinon or Chlorpyrifos Blow boric acid power under stoves and refrigerator. Place bait stations (hydramethylnon) at feeding sites.

## 2023-05-17 NOTE — Progress Notes (Unsigned)
FOLLOW UP  Date of Service/Encounter:  05/17/23   Assessment:   Pruritus - without clear rash  Perennial and seasonal allergic rhinitis (grasses, weeds, trees, indoor molds, outdoor molds, dog, and cockroach)   Wheezing - with spiro in the mid 70% range and no improvement with the albuterol treatment   Complicated past medical history, including a recent fall, elevated LFTs, and a sensation akin to vertigo   Plan/Recommendations:   1. Pruritus - Be sure to get the labs that I ordered when you get a chance.  - Testing today was positive to grasses, weeds, trees, indoor molds, outdoor molds, dog, and cockroach. - Testing to the most common foods was slightly reactive to sesame and cashew.  - You can certainly avoid these if you would like, but they are not too large and can likely be eaten without worrying about having a reaction.  - Continue with cetirizine 10mg  twice daily to help with itching. - Continue with famotidine 10mg  twice daily to help with itching.  - Start triamcinolone 0.1% cream twice daily as needed to the itchiest spots (avoid the face).  2. Wheezing - We will retest your breathing at the next visit. - we are not going to make any changes. - The Trelegy is meant to ne used every day for the best effect.  - Daily controller medication(s): Trelegy 100/62.5/25 one puff once daily - Prior to physical activity: albuterol 2 puffs 10-15 minutes before physical activity. - Rescue medications: albuterol 4 puffs every 4-6 hours as needed - Asthma control goals:  * Full participation in all desired activities (may need albuterol before activity) * Albuterol use two time or less a week on average (not counting use with activity) * Cough interfering with sleep two time or less a month * Oral steroids no more than once a year * No hospitalizations  3. Return in about 3 months (around 08/15/2023). You can have the follow up appointment with Dr. Dellis Anes or a Nurse  Practicioner (our Nurse Practitioners are excellent and always have Physician oversight!).    Subjective:   Anne Carney is a 48 y.o. female presenting today for follow up of No chief complaint on file.   Anne Carney has a history of the following: 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) 12/28/2022   Proteinuria 10/05/2022   Hyperlipidemia 10/05/2022   Elevated alkaline phosphatase level 01/26/2022   Elevated LFTs 11/28/2021   Gastroesophageal reflux disease    Primary hypertension    DM type 2 (diabetes mellitus, type 2) (HCC) 11/21/2015    History obtained from: chart review and patient.  Discussed the use of AI scribe software for clinical note transcription with the patient and/or guardian, who gave verbal consent to proceed.  Anne Carney is a 47 y.o. female presenting for skin testing. She was last seen on November 27. We could not do testing because her insurance company does not cover testing on the same day as a New Patient visit. She has been off of all antihistamines 3 days in anticipation of the testing.   At that time, we decided to order some labs to look for weird causes of itching.  We also plan to do environmental testing today.  For her wheezing, she had testing that was in the 70% range and did not really improve much with albuterol.  We gave her Trelegy to use 1 puff once daily.  In the interim, she has done well.  She has not been using her Trelegy much at all.  She might of used it once or twice. She thinks that it helps when she uses it.  Otherwise, there have been no changes to her past medical history, surgical history, family history, or social history.    Review of systems otherwise negative other than that mentioned in the HPI.    Objective:   There were no vitals taken for this visit. There  is no height or weight on file to calculate BMI.    Physical exam deferred since this was a skin testing appointment only.   Diagnostic studies:   Allergy Studies:     Airborne Adult Perc - 05/17/23 1406     Time Antigen Placed 1406    Allergen Manufacturer Waynette Buttery    Location Back    Number of Test 55    1. Control-Buffer 50% Glycerol Negative    2. Control-Histamine 2+    3. Bahia 2+    4. French Southern Territories Negative    5. Johnson Negative    6. [place] Blue Negative    7. Meadow Fescue Negative    8. Perennial Rye 2+    9. Timothy Negative    10. Ragweed Mix Negative    11. Cocklebur 2+    12. Plantain,  English Negative    13. Baccharis Negative    14. Dog Fennel 2+    15. Guernsey Thistle 2+    16. Lamb's Quarters 3+    17. Sheep Sorrell Negative    18. Rough Pigweed Negative    19. Marsh Elder, Rough Negative    20. Mugwort, Common Negative    21. Box, Elder Negative    22. Cedar, red Negative    23. Sweet Gum Negative    24. Pecan Pollen Negative    25. Pine Mix Negative    26. Walnut, Black Pollen 3+    27. Red Mulberry Negative    28. Ash Mix Negative    29. Birch Mix Negative    30. Beech American Negative    31. Cottonwood, Guinea-Bissau 2+    32. Hickory, White 2+    33. Maple Mix Negative    34. Oak, [place] Mix Negative    35. Sycamore Eastern Negative    36. Alternaria Alternata 3+    37. Cladosporium Herbarum Negative    38. Aspergillus Mix Negative    39. Penicillium Mix Negative    40. Bipolaris Sorokiniana (Helminthosporium) Negative    41. Drechslera Spicifera (Curvularia) Negative    42. Mucor Plumbeus Negative    43. Fusarium Moniliforme Negative    44. Aureobasidium Pullulans (pullulara) Negative    45. Rhizopus Oryzae Negative    46. Botrytis Cinera Negative    47. Epicoccum Nigrum Negative    48. Phoma Betae Negative    49. Dust Mite Mix Negative    50. Cat Hair 10,000 BAU/ml Negative    51.  Dog Epithelia 2+    52. Mixed Feathers Negative     53. Horse Epithelia Negative    54. Cockroach, German 2+    55. Tobacco Leaf Negative             13 Food Perc - 05/17/23 1442       Test Information   Time Antigen Placed 1425    Allergen Manufacturer Waynette Buttery    Location Back  Number of allergen test 13      Food   1. Peanut Negative    2. Soybean Negative    3. Wheat Negative    4. Sesame --   2 x 3   5. Milk, Cow Negative    6. Casein Negative    7. Egg White, Chicken Negative    8. Shellfish Mix Negative    9. Fish Mix Negative    10. Cashew --   4 x 5   11. Walnut Food Negative    12. Almond Negative    13. Hazelnut Negative             Intradermal - 05/17/23 1442     Time Antigen Placed 1445    Allergen Manufacturer Waynette Buttery    Location Arm    Number of Test 9    Control Negative    French Southern Territories Negative    Johnson Negative    Ragweed Mix Negative    Mold 2 2+    Mold 3 2+    Mold 4 4+    Mite Mix Negative    Cat Negative             Allergy testing results were read and interpreted by myself, documented by clinical staff.      Malachi Bonds, MD  [facility]

## 2023-05-17 NOTE — Telephone Encounter (Signed)
Spoke with patient today  She is able to get libre 3+CGM at [facility]; endo is filling and no problems with PA I'm not sure what this company is? And what meter they are referring to?

## 2023-05-20 ENCOUNTER — Encounter: Payer: Self-pay | Admitting: Allergy & Immunology

## 2023-05-20 NOTE — Telephone Encounter (Signed)
Noted  

## 2023-05-20 NOTE — Telephone Encounter (Signed)
Anne Carney from home care delivery is stating that patient is needing her diabetes supplies she is sending over another fax and need's this filled out and sent back

## 2023-05-24 ENCOUNTER — Other Ambulatory Visit: Payer: Self-pay | Admitting: Family Medicine

## 2023-05-24 NOTE — Telephone Encounter (Unsigned)
Copied from CRM [id number]. Topic: Clinical - Medication Refill >> May 24, 2023  2:11 PM Dimitri Ped wrote: Most Recent Primary Care Visit:  Provider: [name]  Department: [facility]  Visit Type: HOSPITAL FU  Date: 03/25/2023  Medication: ***  Has the patient contacted their pharmacy? {yes/no:20286} (Agent: If no, request that the patient contact the pharmacy for the refill. If patient does not wish to contact the pharmacy document the reason why and proceed with request.) (Agent: If yes, when and what did the pharmacy advise?)  Is this the correct pharmacy for this prescription? {yes/no:20286} If no, delete pharmacy and type the correct one.  This is the patient's preferred pharmacy:  [facility] [address], [place] - [address] [place] [address] [place] [address] Phone: [phone] Fax: [phone]   Has the prescription been filled recently? {yes/no:20286}  Is the patient out of the medication? {yes/no:20286}  Has the patient been seen for an appointment in the last year OR does the patient have an upcoming appointment? {yes/no:20286}  Can we respond through MyChart? {yes/no:20286}  Agent: Please be advised that Rx refills may take up to 3 business days. We ask that you follow-up with your pharmacy.

## 2023-05-26 MED ORDER — GABAPENTIN 100 MG PO CAPS: 200.0000 mg | ORAL_CAPSULE | Freq: Two times a day (BID) | ORAL | 3 refills | Status: DC

## 2023-06-04 ENCOUNTER — Other Ambulatory Visit (HOSPITAL_COMMUNITY): Payer: Self-pay | Admitting: Student

## 2023-06-04 DIAGNOSIS — R7989 Other specified abnormal findings of blood chemistry: Secondary | ICD-10-CM

## 2023-06-06 ENCOUNTER — Telehealth: Payer: Self-pay

## 2023-06-06 ENCOUNTER — Encounter (HOSPITAL_COMMUNITY): Payer: Self-pay

## 2023-06-06 ENCOUNTER — Ambulatory Visit (HOSPITAL_COMMUNITY)
Admission: RE | Admit: 2023-06-06 | Discharge: 2023-06-06 | Disposition: A | Discharge status: Home / Self Care | Payer: Medicaid Other | Source: Ambulatory Visit | Attending: Gastroenterology | Admitting: Gastroenterology

## 2023-06-06 ENCOUNTER — Other Ambulatory Visit: Payer: Self-pay

## 2023-06-06 VITALS — BP 140/78 | HR 94 | Temp 98.2°F | Resp 24 | Ht 65.0 in | Wt 162.0 lb

## 2023-06-06 DIAGNOSIS — I10 Essential (primary) hypertension: Secondary | ICD-10-CM | POA: Insufficient documentation

## 2023-06-06 DIAGNOSIS — E785 Hyperlipidemia, unspecified: Secondary | ICD-10-CM | POA: Insufficient documentation

## 2023-06-06 DIAGNOSIS — Z87891 Personal history of nicotine dependence: Secondary | ICD-10-CM | POA: Insufficient documentation

## 2023-06-06 DIAGNOSIS — Z794 Long term (current) use of insulin: Secondary | ICD-10-CM | POA: Insufficient documentation

## 2023-06-06 DIAGNOSIS — K219 Gastro-esophageal reflux disease without esophagitis: Secondary | ICD-10-CM | POA: Insufficient documentation

## 2023-06-06 DIAGNOSIS — E1140 Type 2 diabetes mellitus with diabetic neuropathy, unspecified: Secondary | ICD-10-CM | POA: Diagnosis not present

## 2023-06-06 DIAGNOSIS — K759 Inflammatory liver disease, unspecified: Secondary | ICD-10-CM | POA: Diagnosis not present

## 2023-06-06 DIAGNOSIS — R7989 Other specified abnormal findings of blood chemistry: Secondary | ICD-10-CM | POA: Insufficient documentation

## 2023-06-06 LAB — GLUCOSE, CAPILLARY
Glucose-Capillary: 78 mg/dL (ref 70–99)
Glucose-Capillary: 58 mg/dL — ABNORMAL LOW (ref 70–99)
Glucose-Capillary: 63 mg/dL — ABNORMAL LOW (ref 70–99)
Glucose-Capillary: 165 mg/dL — ABNORMAL HIGH (ref 70–99)

## 2023-06-06 LAB — PREGNANCY, URINE: Preg Test, Ur: NEGATIVE

## 2023-06-06 LAB — CBC
HCT: 36.4 % (ref 36.0–46.0)
Hemoglobin: 12.2 g/dL (ref 12.0–15.0)
MCH: 30.3 pg (ref 26.0–34.0)
MCHC: 33.5 g/dL (ref 30.0–36.0)
MCV: 90.5 fL (ref 80.0–100.0)
Platelets: 146 10*3/uL — ABNORMAL LOW (ref 150–400)
RBC: 4.02 MIL/uL (ref 3.87–5.11)
RDW: 13 % (ref 11.5–15.5)
WBC: 9.5 10*3/uL (ref 4.0–10.5)
nRBC: 0 % (ref 0.0–0.2)

## 2023-06-06 LAB — PROTIME-INR
INR: 1 (ref 0.8–1.2)
Prothrombin Time: 12.9 s (ref 11.4–15.2)

## 2023-06-06 MED ORDER — MIDAZOLAM HCL 2 MG/2ML IJ SOLN
INTRAMUSCULAR | Status: AC | PRN
  Administered 2023-06-06: 1 mg via INTRAVENOUS
  Administered 2023-06-06: .5 mg via INTRAVENOUS

## 2023-06-06 MED ORDER — FENTANYL CITRATE (PF) 100 MCG/2ML IJ SOLN
INTRAMUSCULAR | Status: AC
  Filled 2023-06-06: qty 2

## 2023-06-06 MED ORDER — MIDAZOLAM HCL 2 MG/2ML IJ SOLN
INTRAMUSCULAR | Status: AC
Start: 2023-06-06 — End: ?
  Filled 2023-06-06: qty 2

## 2023-06-06 MED ORDER — LIDOCAINE HCL (PF) 1 % IJ SOLN
10.0000 mL | Freq: Once | INTRAMUSCULAR | Status: AC
  Administered 2023-06-06: 10 mL

## 2023-06-06 MED ORDER — FENTANYL CITRATE (PF) 100 MCG/2ML IJ SOLN
INTRAMUSCULAR | Status: AC | PRN
  Administered 2023-06-06 (×2): 25 ug via INTRAVENOUS

## 2023-06-06 NOTE — Procedures (Signed)
Interventional Radiology Procedure Note ° °Procedure: US Guided Biopsy of liver ° °Complications: None ° °Estimated Blood Loss: < 10 mL ° °Findings: °18 G core biopsy of liver performed under US guidance.  Three core samples obtained and sent to Pathology. ° °Anne Carney, M.D °Pager:  [phone] ° °  °

## 2023-06-06 NOTE — H&P (Signed)
Chief Complaint: Patient was seen in consultation today for random liver biopsy secondary to elevated LFTs.   Referring Provider(s): Ms. [name], PA-C  Supervising Physician: [name]  Patient Status: Anne Carney - Out-pt  Patient is Full Code  History of Present Illness: Anne Carney is a 47 y.o. female with PMHx of T2DM, GERD, HTN, HLD, IDA, chronic headaches.  Per [name]'s progress note on 10/08: Patient was recently admitted at [facility] for hyperosmolar hyperglycemic state. Per patient's son, patient had liver biopsy (nonspecific findings). Has had elevated ferritin, iron sat, but negative hemochromatosis genetics. Referral to [facility] due to inconclusive work up. Patient with chronically elevated LFTS, mostly alk phos, undetermined etiology to date.    Labs during recent hospitalization, 03/2023: Hgb 11.2, platelet 209K, alb 2.6, Tbili 0.6, AP 322-->253H, AST 21, ALT 21, Tbili 0.6. Glucose 503. UDS negative. Ethanol <3. Creatinine 0.88   MRCP w/wo on 10/22: IMPRESSION: 1. No MR findings of the abdomen to explain elevated alkaline phosphatase. No evidence of liver mass or metastatic disease. 2. Large burden of stool in the included colon.  Interventional Radiology was requested for random liver biopsy random liver biopsy. Patient is scheduled for same in IR today.  All labs and medications are within acceptable parameters. No pertinent allergies. Patient has been NPO since midnight.    Currently without any significant complaints. Patient alert and laying in bed,calm. Denies any fevers, headache, chest pain, SOB, cough, abdominal pain, nausea, vomiting or bleeding.     Past Medical History:  Diagnosis Date   Diabetes mellitus    Hypertension    Kidney disease    Neuropathy     Past Surgical History:  Procedure Laterality Date   BIOPSY  09/25/2021   Procedure: BIOPSY;  Surgeon: Lanelle Bal, DO;  [facility];  Service:  Endoscopy;;   CESAREAN SECTION     COLONOSCOPY WITH PROPOFOL N/A 09/25/2021   Procedure: COLONOSCOPY WITH PROPOFOL;  Surgeon: Lanelle Bal, DO;  [facility];  Service: Endoscopy;  Laterality: N/A;  9:30am, ASA 3   COLONOSCOPY WITH PROPOFOL N/A 04/24/2022   Procedure: COLONOSCOPY WITH PROPOFOL;  Surgeon: Lanelle Bal, DO;  [facility];  Service: Endoscopy;  Laterality: N/A;  12:00pm, asa 2   ESOPHAGOGASTRODUODENOSCOPY (EGD) WITH PROPOFOL N/A 09/25/2021   Procedure: ESOPHAGOGASTRODUODENOSCOPY (EGD) WITH PROPOFOL;  Surgeon: Lanelle Bal, DO;  [facility];  Service: Endoscopy;  Laterality: N/A;   POLYPECTOMY  04/24/2022   Procedure: POLYPECTOMY INTESTINAL;  Surgeon: Lanelle Bal, DO;  [facility];  Service: Endoscopy;;   TOE SURGERY Right    TOE SURGERY Left     Allergies: Patient has no known allergies.  Medications: Prior to Admission medications   Medication Sig Start Date End Date Taking? Authorizing Provider  amLODipine (NORVASC) 5 MG tablet Take 1 tablet (5 mg total) by mouth daily. 03/25/23   [name], DO  blood glucose meter kit and supplies KIT Dispense based on patient and insurance preference. Use to check sugar up ro four times daily as directed. 10/24/20   Vassie Loll, MD  cetirizine (ZYRTEC) 10 MG tablet Take 1 tablet (10 mg total) by mouth 2 (two) times daily. 05/08/23 08/06/23  Alfonse Spruce, MD  Continuous Glucose Sensor (FREESTYLE LIBRE 3 PLUS SENSOR) MISC Change sensor every 15 days. 03/18/23   Dani Gobble, NP  Continuous Glucose Sensor (FREESTYLE LIBRE 3 PLUS SENSOR) MISC  Change sensor every 15 days. 04/18/23   Dani Gobble, NP  famotidine (PEPCID) 20 MG tablet Take 1 tablet (20 mg total) by mouth 2 (two) times daily. 05/08/23 08/06/23  Alfonse Spruce, MD  furosemide (LASIX) 20 MG tablet Take 20 mg by mouth daily as needed. 09/28/20   [provider]  gabapentin  (NEURONTIN) 100 MG capsule Take 2 capsules (200 mg total) by mouth 2 (two) times daily. 05/26/23   Everlene Other G, DO  glucose blood (CONTOUR NEXT TEST) test strip Use as instructed to monitor glucose 4 times daily. 04/03/23   Dani Gobble, NP  ibuprofen (ADVIL) 800 MG tablet Take 1 tablet (800 mg total) by mouth every 8 (eight) hours as needed for headache. 03/12/23   Bethann Berkshire, MD  insulin aspart (NOVOLOG FLEXPEN) 100 UNIT/ML FlexPen Inject 14-20 Units into the skin 3 (three) times daily with meals. 04/18/23   Dani Gobble, NP  insulin glargine (LANTUS) 100 unit/mL SOPN Inject 30 Units into the skin at bedtime. 04/18/23   Dani Gobble, NP  Insulin Pen Needle 32G X 4 MM MISC Use up to 5 times daily to administer insulin. 11/27/22   [name], DO  Iron, Ferrous Sulfate, 325 (65 Fe) MG TABS Take 325 mg by mouth every other day. 10/11/22   [name], DO  linaclotide [facility]) 145 MCG CAPS capsule Take 1 capsule (145 mcg total) by mouth daily before breakfast. 03/19/23   [name], PA-C  lisinopril-hydrochlorothiazide (ZESTORETIC) 10-12.5 MG tablet Take 1 tablet by mouth daily. 10/05/22   [name], DO  meclizine (ANTIVERT) 25 MG tablet Take 1 tablet (25 mg total) by mouth 3 (three) times daily as needed for dizziness. 03/12/23   Bethann Berkshire, MD  ondansetron (ZOFRAN-ODT) 4 MG disintegrating tablet 4mg  ODT q4 hours prn nausea/vomit 03/12/23   Bethann Berkshire, MD  triamcinolone cream (KENALOG) 0.1 % Apply 1 Application topically 2 (two) times daily as needed. 05/17/23   Alfonse Spruce, MD     Family History  Problem Relation Age of Onset   Asthma Mother    Huntington's disease Father    Asthma Daughter    Diabetes Daughter    Asthma Son     Social History   Socioeconomic History   Marital status: Divorced    Spouse name: Not on file   Number of children: Not on file   Years of education: Not on file   Highest education level: Not on file  Occupational  History   Not on file  Tobacco Use   Smoking status: Former    Types: Cigarettes    Passive exposure: Past   Smokeless tobacco: Never  Vaping Use   Vaping status: Never Used  Substance and Sexual Activity   Alcohol use: Yes    Comment: occ.   Drug use: Not Currently    Types: Marijuana    Comment: occasionally   Sexual activity: Yes    Birth control/protection: None  Other Topics Concern   Not on file  Social History Narrative   Not on file   Social Drivers of Health   Financial Resource Strain: Not on file  Food Insecurity: No Food Insecurity (03/25/2023)   Hunger Vital Sign    Worried About Running Out of Food in the Last Year: Never true    Ran Out of Food in the Last Year: Never true  Transportation Needs: No Transportation Needs (03/25/2023)   PRAPARE - Transportation  Lack of Transportation (Medical): No    Lack of Transportation (Non-Medical): No  Physical Activity: Not on file  Stress: Not on file  Social Connections: Not on file     Review of Systems: A 12 point ROS discussed and pertinent positives are indicated in the HPI above.  All other systems are negative.  Review of Systems  Constitutional:  Negative for activity change, fatigue and fever.  Respiratory:  Negative for chest tightness, shortness of breath and wheezing.   Cardiovascular:  Negative for chest pain.  Skin:  Negative for color change.  Psychiatric/Behavioral:  Negative for behavioral problems and confusion.     Vital Signs: There were no vitals taken for this visit.  Advance Care Plan: The advanced care place/surrogate decision maker was discussed at the time of visit and the patient did not wish to discuss or was not able to name a surrogate decision maker or provide an advance care plan.  Physical Exam Constitutional:      General: She is not in acute distress.    Appearance: Normal appearance.  HENT:     Mouth/Throat:     Mouth: Mucous membranes are moist.  Cardiovascular:      Rate and Rhythm: Normal rate and regular rhythm.     Heart sounds: No murmur heard. Pulmonary:     Effort: Pulmonary effort is normal.     Breath sounds: Normal breath sounds. No wheezing.  Skin:    General: Skin is warm and dry.  Neurological:     Mental Status: She is alert and oriented to person, place, and time.  Psychiatric:        Mood and Affect: Mood normal.        Behavior: Behavior normal.        Thought Content: Thought content normal.        Judgment: Judgment normal.     Imaging: No results found.  Labs:  CBC: Recent Labs    11/08/22 2242 11/24/22 0016 03/12/23 0537 03/19/23 1307  WBC 8.7 9.2 10.8* 9.5  HGB 11.2* 12.2 13.0 12.3  HCT 34.7* 35.5* 38.0 37.2  PLT 151 165 182 238    COAGS: No results for input(s): "INR", "APTT" in the last 8760 hours.  BMP: Recent Labs    07/03/22 1923 10/04/22 1530 10/19/22 1139 11/08/22 2242 11/24/22 0016 03/12/23 0644  NA 129*   < > 135 136 133* 131*  K 4.3   < > 4.4 3.5 4.3 4.5  CL 91*   < > 101 97* 97* 95*  CO2 28   < > 29 27 25  21*  GLUCOSE 506*   < > 147* 66* 420* 284*  BUN 16   < > 27* 35* 25* 29*  CALCIUM 9.0   < > 9.5 9.0 9.1 8.9  CREATININE 0.85   < > 0.87 1.01* 1.01* 1.15*  GFRNONAA >60  --   --  >60 >60 59*   < > = values in this interval not displayed.    LIVER FUNCTION TESTS: Recent Labs    07/03/22 1923 10/04/22 1530 11/24/22 0016 03/12/23 0644  BILITOT 0.7 0.6 0.9 0.8  AST 34 50* 17 40  ALT 32 38* 26 32  ALKPHOS 213*  --  389* 292*  PROT 7.2 6.6 7.0 7.5  ALBUMIN 3.4*  --  3.3* 3.6    TUMOR MARKERS: Recent Labs    03/19/23 1307  AFPTM 4.0    Assessment and Plan: Per [name]'s progress note on  10/08: Patient was recently admitted at [facility] for hyperosmolar hyperglycemic state. Per patient's son, patient had liver biopsy (nonspecific findings). Has had elevated ferritin, iron sat, but negative hemochromatosis genetics. Referral to [facility] due to inconclusive  work up. Patient with chronically elevated LFTS, mostly alk phos, undetermined etiology to date.   Patient presents for scheduled random liver biopsy in IR today.  Risks and benefits of random liver biopsy was discussed with the patient and/or patient's family including, but not limited to bleeding, infection, damage to adjacent structures or low yield requiring additional tests.  All of the questions were answered and there is agreement to proceed.  Consent signed and in chart.   Thank you for allowing our service to participate in Pipper Underhill 's care.  Electronically Signed: Sable Feil, PA-C   06/06/2023, 10:03 AM      I spent a total of  30 Minutes   in face to face in clinical consultation, greater than 50% of which was counseling/coordinating care for random liver biopsy.

## 2023-06-06 NOTE — Telephone Encounter (Signed)
Reason for CRM: Anne Carney from [facility] is calling regarding a request for blood pressure montior that was sent on Dec 9 - call back [phone]   [place] delivered and spoke with Pamelia Hoit regarding the Blood Pressure Monitor, Pamelia Hoit explained that for this pt they have a different last name and the form they have been sending for pt to receive Blood pressure monitor has had the different last name, Home health delivered will be reaching out to clarify this confusion with pt

## 2023-06-07 LAB — SURGICAL PATHOLOGY

## 2023-06-10 ENCOUNTER — Ambulatory Visit: Payer: Medicaid Other | Admitting: Nutrition

## 2023-06-18 ENCOUNTER — Other Ambulatory Visit: Payer: Self-pay | Admitting: *Deleted

## 2023-06-18 ENCOUNTER — Other Ambulatory Visit (HOSPITAL_COMMUNITY): Payer: Self-pay

## 2023-06-18 ENCOUNTER — Telehealth: Payer: Self-pay

## 2023-06-18 DIAGNOSIS — R7989 Other specified abnormal findings of blood chemistry: Secondary | ICD-10-CM

## 2023-06-18 DIAGNOSIS — R748 Abnormal levels of other serum enzymes: Secondary | ICD-10-CM

## 2023-06-18 NOTE — Telephone Encounter (Signed)
 Pharmacy Patient Advocate Encounter   Received notification from CoverMyMeds that prior authorization for Freestyle libre 3 plus is required/requested.   Insurance verification completed.   The patient is insured through [facility] .   Per test claim: PA required; PA started via CoverMyMeds. KEY [id number] . Waiting for clinical questions to populate.

## 2023-06-19 ENCOUNTER — Encounter: Payer: Medicaid Other | Attending: Family Medicine | Admitting: Nutrition

## 2023-06-19 VITALS — Ht 65.0 in | Wt 192.0 lb

## 2023-06-19 DIAGNOSIS — I10 Essential (primary) hypertension: Secondary | ICD-10-CM | POA: Diagnosis not present

## 2023-06-19 DIAGNOSIS — E782 Mixed hyperlipidemia: Secondary | ICD-10-CM | POA: Insufficient documentation

## 2023-06-19 DIAGNOSIS — R7989 Other specified abnormal findings of blood chemistry: Secondary | ICD-10-CM | POA: Insufficient documentation

## 2023-06-19 DIAGNOSIS — E118 Type 2 diabetes mellitus with unspecified complications: Secondary | ICD-10-CM | POA: Diagnosis not present

## 2023-06-19 NOTE — Progress Notes (Signed)
 Medical Nutrition Therapy  Appointment Start time:  1600  Appointment End time:  1700  Primary concerns today: DM Type 2  Referral diagnosis: E11.8 Preferred learning style: NO Preference  Learning readiness: Ready   NUTRITION ASSESSMENT  48 yr old female referred for Type 2 DM PCP Dr. Bluford. Sees Benton Rio, FNP at [facility]. A1C 8.7%. Has the LIbre 3+, Lantus  30 units at night and Novolog  10 units plus sliding scale with meals. Admits to skipping doses and skipping meals. Complains of frequent urination, increased thirst and blurry vision at times. Tired often. Eats out a lot. Skips breakfast or lunch usually daily. She is willing to work on eating better and taking her medications as prescribed. Will try to focus on more whole plant based foods.  Libre showed 38% TIR, 60% TAR and 2% below range. She notes she would have to eat when her blood sugar drops due to giving extra insulin  or not eating on time.   Clinical Medical Hx:  Past Medical History:  Diagnosis Date   Diabetes mellitus    Hypertension    Kidney disease    Neuropathy     Medications:  Current Outpatient Medications on File Prior to Visit  Medication Sig Dispense Refill   amLODipine  (NORVASC ) 5 MG tablet Take 1 tablet (5 mg total) by mouth daily. 90 tablet 1   blood glucose meter kit and supplies KIT Dispense based on patient and insurance preference. Use to check sugar up ro four times daily as directed. 1 each 0   cetirizine  (ZYRTEC ) 10 MG tablet Take 1 tablet (10 mg total) by mouth 2 (two) times daily. 180 tablet 0   Continuous Glucose Sensor (FREESTYLE LIBRE 3 PLUS SENSOR) MISC Change sensor every 15 days. 2 each 3   Continuous Glucose Sensor (FREESTYLE LIBRE 3 PLUS SENSOR) MISC Change sensor every 15 days. 6 each 3   famotidine  (PEPCID ) 20 MG tablet Take 1 tablet (20 mg total) by mouth 2 (two) times daily. 180 tablet 0   furosemide  (LASIX ) 20 MG tablet Take 20 mg by mouth daily as needed.      gabapentin  (NEURONTIN ) 100 MG capsule Take 2 capsules (200 mg total) by mouth 2 (two) times daily. 360 capsule 3   glucose blood (CONTOUR NEXT TEST) test strip Use as instructed to monitor glucose 4 times daily. 400 each 6   ibuprofen  (ADVIL ) 800 MG tablet Take 1 tablet (800 mg total) by mouth every 8 (eight) hours as needed for headache. 21 tablet 0   insulin  aspart (NOVOLOG  FLEXPEN) 100 UNIT/ML FlexPen Inject 14-20 Units into the skin 3 (three) times daily with meals. 30 mL 3   insulin  glargine (LANTUS ) 100 unit/mL SOPN Inject 30 Units into the skin at bedtime. 18 mL 6   Insulin  Pen Needle 32G X 4 MM MISC Use up to 5 times daily to administer insulin . 200 each 3   Iron , Ferrous Sulfate , 325 (65 Fe) MG TABS Take 325 mg by mouth every other day. 45 tablet 1   linaclotide  (LINZESS ) 145 MCG CAPS capsule Take 1 capsule (145 mcg total) by mouth daily before breakfast. 30 capsule 5   lisinopril -hydrochlorothiazide  (ZESTORETIC ) 10-12.5 MG tablet Take 1 tablet by mouth daily. 90 tablet 3   meclizine  (ANTIVERT ) 25 MG tablet Take 1 tablet (25 mg total) by mouth 3 (three) times daily as needed for dizziness. 30 tablet 0   ondansetron  (ZOFRAN -ODT) 4 MG disintegrating tablet 4mg  ODT q4 hours prn nausea/vomit 10 tablet 0  triamcinolone  cream (KENALOG ) 0.1 % Apply 1 Application topically 2 (two) times daily as needed. 454 g 1   No current facility-administered medications on file prior to visit.    Labs:  Lab Results  Component Value Date   HGBA1C 8.7 (A) 01/16/2023    Notable Signs/Symptoms: frequent urination, increased thirst and blurry vision at times. Tired often  Lifestyle & Dietary Hx LIves with her kids Eats out often.  Estimated daily fluid intake: 30 oz Supplements:  Sleep:  Stress / self-care:  Current average weekly physical activity: ADL  24-Hr Dietary Recall First Meal: chicken and cheese steak,  Snack:  Second Meal: bacon and egg wrap, coffee Snack:  Third Meal: pasta,  steak and salad,  Snack:  Beverages: soda and diet soda, water  Estimated Energy Needs Calories: 1200 Carbohydrate: 135g Protein: 90g Fat: 33g   NUTRITION DIAGNOSIS  NB-1.1 Food and nutrition-related knowledge deficit As related to Diabetes Type 2.  As evidenced by A1C 8.7%.   NUTRITION INTERVENTION  Nutrition education (E-1) on the following topics:  Nutrition and Diabetes education provided on My Plate, CHO counting, meal planning, portion sizes, timing of meals, avoiding snacks between meals unless having a low blood sugar, target ranges for A1C and blood sugars, signs/symptoms and treatment of hyper/hypoglycemia, monitoring blood sugars, taking medications as prescribed, benefits of exercising 30 minutes per day and prevention of complications of DM.  Lifestyle Medicine  - Whole Food, Plant Predominant Nutrition is highly recommended: Eat Plenty of vegetables, Mushrooms, fruits, Legumes, Whole Grains, Nuts, seeds in lieu of processed meats, processed snacks/pastries red meat, poultry, eggs.    -It is better to avoid simple carbohydrates including: Cakes, Sweet Desserts, Ice Cream, Soda (diet and regular), Sweet Tea, Candies, Chips, Cookies, Store Bought Juices, Alcohol in Excess of  1-2 drinks a day, Lemonade,  Artificial Sweeteners, Doughnuts, Coffee Creamers, Sugar-free Products, etc, etc.  This is not a complete list.....  Exercise: If you are able: 30 -60 minutes a day ,4 days a week, or 150 minutes a week.  The longer the better.  Combine stretch, strength, and aerobic activities.  If you were told in the past that you have high risk for cardiovascular diseases, you may seek evaluation by your heart doctor prior to initiating moderate to intense exercise programs.   Handouts Provided Include  Lifestyle Medicine  - Whole Food, Plant Predominant Nutrition is highly recommended: Eat Plenty of vegetables, Mushrooms, fruits, Legumes, Whole Grains, Nuts, seeds in lieu of processed  meats, processed snacks/pastries red meat, poultry, eggs.    -It is better to avoid simple carbohydrates including: Cakes, Sweet Desserts, Ice Cream, Soda (diet and regular), Sweet Tea, Candies, Chips, Cookies, Store Bought Juices, Alcohol in Excess of  1-2 drinks a day, Lemonade,  Artificial Sweeteners, Doughnuts, Coffee Creamers, Sugar-free Products, etc, etc.  This is not a complete list.....  Exercise: If you are able: 30 -60 minutes a day ,4 days a week, or 150 minutes a week.  The longer the better.  Combine stretch, strength, and aerobic activities.  If you were told in the past that you have high risk for cardiovascular diseases, you may seek evaluation by your heart doctor prior to initiating moderate to intense exercise programs.   Nutrition and Diabetes education provided on My Plate, CHO counting, meal planning, portion sizes, timing of meals, avoiding snacks between meals unless having a low blood sugar, target ranges for A1C and blood sugars, signs/symptoms and treatment of hyper/hypoglycemia, monitoring blood sugars, taking medications  as prescribed, benefits of exercising 30 minutes per day and prevention of complications of DM.   Learning Style & Readiness for Change Teaching method utilized: Visual & Auditory  Demonstrated degree of understanding via: Teach Back  Barriers to learning/adherence to lifestyle change: None  Goals Established by Pt Goals  Eat three meals per day at times discussed Use sliding scale for meal time insulin . Eat 30-45 grams of carbs plus protein and veggies with meals Do not skip meals Don't eat after dinner. Walk 30 minutes or more daily. Get A1C down to 7%.   MONITORING & EVALUATION Dietary intake, weekly physical activity, and blood sugars in 1 month.  Next Steps  Patient is to work on eating better balanced meals and take insulin  consistently.SABRA

## 2023-06-25 ENCOUNTER — Encounter: Payer: Self-pay | Admitting: Nutrition

## 2023-06-25 NOTE — Patient Instructions (Signed)
 Goals  Eat three meals per day at times discussed Use sliding scale for meal time insulin. Eat 30-45 grams of carbs plus protein and veggies with meals Do not skip meals Don't eat after dinner. Walk 30 minutes or more daily. Get A1C down to 7%.

## 2023-06-28 ENCOUNTER — Ambulatory Visit
Admission: EM | Admit: 2023-06-28 | Discharge: 2023-06-28 | Disposition: A | Discharge status: Home / Self Care | Payer: Medicaid Other | Attending: Nurse Practitioner | Admitting: Nurse Practitioner

## 2023-06-28 ENCOUNTER — Other Ambulatory Visit: Payer: Self-pay

## 2023-06-28 ENCOUNTER — Encounter (HOSPITAL_COMMUNITY): Payer: Self-pay

## 2023-06-28 ENCOUNTER — Emergency Department (HOSPITAL_COMMUNITY)
Admission: EM | Admit: 2023-06-28 | Discharge: 2023-06-29 | Disposition: A | Discharge status: Home / Self Care | Payer: Medicaid Other | Attending: Emergency Medicine | Admitting: Emergency Medicine

## 2023-06-28 DIAGNOSIS — Z79899 Other long term (current) drug therapy: Secondary | ICD-10-CM | POA: Diagnosis not present

## 2023-06-28 DIAGNOSIS — I10 Essential (primary) hypertension: Secondary | ICD-10-CM | POA: Diagnosis not present

## 2023-06-28 DIAGNOSIS — E1165 Type 2 diabetes mellitus with hyperglycemia: Secondary | ICD-10-CM | POA: Diagnosis not present

## 2023-06-28 DIAGNOSIS — A084 Viral intestinal infection, unspecified: Secondary | ICD-10-CM

## 2023-06-28 DIAGNOSIS — Z794 Long term (current) use of insulin: Secondary | ICD-10-CM | POA: Diagnosis not present

## 2023-06-28 DIAGNOSIS — R000 Tachycardia, unspecified: Secondary | ICD-10-CM | POA: Insufficient documentation

## 2023-06-28 DIAGNOSIS — R112 Nausea with vomiting, unspecified: Secondary | ICD-10-CM | POA: Diagnosis present

## 2023-06-28 LAB — POC COVID19/FLU A&B COMBO
Covid Antigen, POC: NEGATIVE
Influenza A Antigen, POC: NEGATIVE
Influenza B Antigen, POC: NEGATIVE

## 2023-06-28 NOTE — ED Triage Notes (Signed)
Pt reports she feels sick on her stomach, had vomiting, and no appetite x 3 days

## 2023-06-28 NOTE — ED Triage Notes (Signed)
Pt having stomach pain since Tuesday and has been vomiting since. Pt unable to keep anything down. Zofran not helping

## 2023-06-28 NOTE — Discharge Instructions (Addendum)
The COVID/flu test was negative. May take OTC Tylenol as needed for pain, fever or general discomfort. Recommend a step up diet. Start with clear a liquid diet to include broth, Sprite, ginger ale, or popsicles.  If you can tolerate that, you can step up to a soft diet with soft vegetables and meats, and if you tolerate that, you can return to a regular diet. Make sure you are drinking at least 8-10 8 ounce glasses of water while symptoms persist to remain hydrated.  You can also drink Pedialyte or Gatorlyte to prevent dehydration. Go to the emergency department immediately if you experience worsening nausea with new symptoms of fever, chills, vomiting, or worsening abdominal pain. Follow-up as needed.

## 2023-06-28 NOTE — ED Provider Notes (Signed)
[facility]    CSN: [id number] Arrival date & time: 06/28/23  1609      History   Chief Complaint No chief complaint on file.   HPI Anne Carney is a 48 y.o. female.   The history is provided by the patient.   Patient presents with a 3-day history of abdominal pain, nausea, and vomiting.  Patient also reports decreased appetite.  She denies fever, chills, chest pain, diarrhea, constipation, gas, bloating, reflux symptoms, or urinary symptoms.  Patient states that she has been drinking, but has not tried eating anything.  Denies any obvious known sick contacts.  Past Medical History:  Diagnosis Date   Diabetes mellitus    Hypertension    Kidney disease    Neuropathy     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) 12/28/2022   Proteinuria 10/05/2022   Hyperlipidemia 10/05/2022   Elevated alkaline phosphatase level 01/26/2022   Elevated LFTs 11/28/2021   Gastroesophageal reflux disease    Primary hypertension    DM type 2 (diabetes mellitus, type 2) (HCC) 11/21/2015    Past Surgical History:  Procedure Laterality Date   BIOPSY  09/25/2021   Procedure: BIOPSY;  Surgeon: Lanelle Bal, DO;  Location: [facility];  Service: Endoscopy;;   CESAREAN SECTION     COLONOSCOPY WITH PROPOFOL N/A 09/25/2021   Procedure: COLONOSCOPY WITH PROPOFOL;  Surgeon: Lanelle Bal, DO;  Location: [facility];  Service: Endoscopy;  Laterality: N/A;  9:30am, ASA 3   COLONOSCOPY WITH PROPOFOL N/A 04/24/2022   Procedure: COLONOSCOPY WITH PROPOFOL;  Surgeon: Lanelle Bal, DO;  Location: [facility];  Service: Endoscopy;  Laterality: N/A;  12:00pm, asa 2   ESOPHAGOGASTRODUODENOSCOPY (EGD) WITH PROPOFOL N/A 09/25/2021   Procedure: ESOPHAGOGASTRODUODENOSCOPY (EGD) WITH PROPOFOL;  Surgeon: Lanelle Bal, DO;   Location: [facility];  Service: Endoscopy;  Laterality: N/A;   POLYPECTOMY  04/24/2022   Procedure: POLYPECTOMY INTESTINAL;  Surgeon: Lanelle Bal, DO;  Location: [facility];  Service: Endoscopy;;   TOE SURGERY Right    TOE SURGERY Left     OB History   No obstetric history on file.      Home Medications    Prior to Admission medications   Medication Sig Start Date End Date Taking? Authorizing Provider  amLODipine (NORVASC) 5 MG tablet Take 1 tablet (5 mg total) by mouth daily. 03/25/23   [name], DO  blood glucose meter kit and supplies KIT Dispense based on patient and insurance preference. Use to check sugar up ro four times daily as directed. 10/24/20   Vassie Loll, MD  cetirizine (ZYRTEC) 10 MG tablet Take 1 tablet (10 mg total) by mouth 2 (two) times daily. 05/08/23 08/06/23  Alfonse Spruce, MD  Continuous Glucose Sensor (FREESTYLE LIBRE 3 PLUS SENSOR) MISC Change sensor every 15 days. 03/18/23   Dani Gobble, NP  Continuous Glucose Sensor (FREESTYLE LIBRE 3 PLUS SENSOR) MISC Change sensor every 15 days. 04/18/23   Dani Gobble, NP  famotidine (PEPCID) 20 MG tablet Take 1 tablet (20 mg total) by mouth 2 (two) times daily. 05/08/23 08/06/23  Alfonse Spruce, MD  furosemide (LASIX) 20 MG tablet Take 20 mg by mouth daily as needed. 09/28/20   [provider]  gabapentin (NEURONTIN) 100 MG capsule Take 2  capsules (200 mg total) by mouth 2 (two) times daily. 05/26/23   Everlene Other G, DO  glucose blood (CONTOUR NEXT TEST) test strip Use as instructed to monitor glucose 4 times daily. 04/03/23   Dani Gobble, NP  ibuprofen (ADVIL) 800 MG tablet Take 1 tablet (800 mg total) by mouth every 8 (eight) hours as needed for headache. 03/12/23   Bethann Berkshire, MD  insulin aspart (NOVOLOG FLEXPEN) 100 UNIT/ML FlexPen Inject 14-20 Units into the skin 3 (three) times daily with meals. 04/18/23   Dani Gobble, NP  insulin glargine (LANTUS) 100  unit/mL SOPN Inject 30 Units into the skin at bedtime. 04/18/23   Dani Gobble, NP  Insulin Pen Needle 32G X 4 MM MISC Use up to 5 times daily to administer insulin. 11/27/22   [name], DO  Iron, Ferrous Sulfate, 325 (65 Fe) MG TABS Take 325 mg by mouth every other day. 10/11/22   [name], DO  linaclotide [facility]) 145 MCG CAPS capsule Take 1 capsule (145 mcg total) by mouth daily before breakfast. 03/19/23   [name], PA-C  lisinopril-hydrochlorothiazide (ZESTORETIC) 10-12.5 MG tablet Take 1 tablet by mouth daily. 10/05/22   [name], DO  meclizine (ANTIVERT) 25 MG tablet Take 1 tablet (25 mg total) by mouth 3 (three) times daily as needed for dizziness. 03/12/23   Bethann Berkshire, MD  ondansetron (ZOFRAN-ODT) 4 MG disintegrating tablet 4mg  ODT q4 hours prn nausea/vomit 03/12/23   Bethann Berkshire, MD  triamcinolone cream (KENALOG) 0.1 % Apply 1 Application topically 2 (two) times daily as needed. 05/17/23   Alfonse Spruce, MD    Family History Family History  Problem Relation Age of Onset   Asthma Mother    Huntington's disease Father    Asthma Daughter    Diabetes Daughter    Asthma Son     Social History Social History   Tobacco Use   Smoking status: Former    Types: Cigarettes    Passive exposure: Past   Smokeless tobacco: Never  Vaping Use   Vaping status: Never Used  Substance Use Topics   Alcohol use: Yes    Comment: occ.   Drug use: Not Currently    Types: Marijuana    Comment: occasionally     Allergies   Patient has no known allergies.   Review of Systems Review of Systems Per HPI  Physical Exam Triage Vital Signs ED Triage Vitals  Encounter Vitals Group     BP 06/28/23 1654 (!) 145/83     Systolic BP Percentile --      Diastolic BP Percentile --      Pulse Rate 06/28/23 1654 (!) 109     Resp 06/28/23 1654 20     Temp 06/28/23 1654 99.4 F (37.4 C)     Temp Source 06/28/23 1654 Oral     SpO2 06/28/23 1654 98 %     Weight  --      Height --      Head Circumference --      Peak Flow --      Pain Score 06/28/23 1655 5     Pain Loc --      Pain Education --      Exclude from Growth Chart --    No data found.  Updated Vital Signs BP (!) 145/83 (BP Location: Right Arm)   Pulse (!) 109   Temp 99.4 F (37.4 C) (Oral)   Resp 20  LMP 06/27/2023 Comment: start  SpO2 98%   Visual Acuity Right Eye Distance:   Left Eye Distance:   Bilateral Distance:    Right Eye Near:   Left Eye Near:    Bilateral Near:     Physical Exam Vitals and nursing note reviewed.  Constitutional:      General: She is not in acute distress.    Appearance: Normal appearance.  HENT:     Head: Normocephalic.     Mouth/Throat:     Mouth: Mucous membranes are moist.  Eyes:     Extraocular Movements: Extraocular movements intact.     Pupils: Pupils are equal, round, and reactive to light.  Cardiovascular:     Rate and Rhythm: Normal rate and regular rhythm.     Pulses: Normal pulses.     Heart sounds: Normal heart sounds.  Pulmonary:     Effort: Pulmonary effort is normal. No respiratory distress.     Breath sounds: Normal breath sounds. No stridor. No wheezing, rhonchi or rales.  Abdominal:     General: Bowel sounds are normal.     Palpations: Abdomen is soft.     Tenderness: There is abdominal tenderness.  Musculoskeletal:     Cervical back: Normal range of motion.  Lymphadenopathy:     Cervical: No cervical adenopathy.  Neurological:     General: No focal deficit present.     Mental Status: She is alert and oriented to person, place, and time.  Psychiatric:        Mood and Affect: Mood normal.        Behavior: Behavior normal.     UC Treatments / Results  Labs (all labs ordered are listed, but only abnormal results are displayed) Labs Reviewed  POC COVID19/FLU A&B COMBO    EKG   Radiology No results found.  Procedures Procedures (including critical care time)  Medications Ordered in  UC Medications - No data to display  Initial Impression / Assessment and Plan / UC Course  I have reviewed the triage vital signs and the nursing notes.  Pertinent labs & imaging results that were available during my care of the patient were reviewed by me and considered in my medical decision making (see chart for details).  On exam, lung sounds are clear throughout, room air sats at 98%.  Patient does have generalized abdominal tenderness.  COVID/flu test was negative.  Suspect viral gastroenteritis.  Offered patient ondansetron for nausea, states that she will use some of her daughters medications.  Supportive care recommendations were provided and discussed with the patient to include fluids, rest, a step up diet, and over-the-counter analgesics.  Discussed indications with the patient regarding ER follow-up.  Patient was in agreement with this plan of care and verbalized understanding.  All questions were answered.  Patient stable for discharge.  Work note was provided.  Final Clinical Impressions(s) / UC Diagnoses   Final diagnoses:  Viral gastroenteritis     Discharge Instructions      The COVID/flu test was negative. May take OTC Tylenol as needed for pain, fever or general discomfort. Recommend a step up diet. Start with clear a liquid diet to include broth, Sprite, ginger ale, or popsicles.  If you can tolerate that, you can step up to a soft diet with soft vegetables and meats, and if you tolerate that, you can return to a regular diet. Make sure you are drinking at least 8-10 8 ounce glasses of water while symptoms persist to remain  hydrated.  You can also drink Pedialyte or Gatorlyte to prevent dehydration. Go to the emergency department immediately if you experience worsening nausea with new symptoms of fever, chills, vomiting, or worsening abdominal pain. Follow-up as needed.     ED Prescriptions   None    PDMP not reviewed this encounter.   Abran Cantor, NP 06/28/23 1759

## 2023-06-29 LAB — URINALYSIS, ROUTINE W REFLEX MICROSCOPIC
Bacteria, UA: NONE SEEN
Bilirubin Urine: NEGATIVE
Glucose, UA: >=500 mg/dL — AB
Ketones, ur: 20 mg/dL — AB
Leukocytes,Ua: NEGATIVE
Nitrite: NEGATIVE
Protein, ur: 100 mg/dL — AB
RBC / HPF: >50 RBC/hpf (ref 0–5)
Specific Gravity, Urine: 1.013 (ref 1.005–1.030)
pH: 6 (ref 5.0–8.0)

## 2023-06-29 LAB — CBC
HCT: 36.5 % (ref 36.0–46.0)
Hemoglobin: 12.6 g/dL (ref 12.0–15.0)
MCH: 30.1 pg (ref 26.0–34.0)
MCHC: 34.5 g/dL (ref 30.0–36.0)
MCV: 87.3 fL (ref 80.0–100.0)
Platelets: 182 10*3/uL (ref 150–400)
RBC: 4.18 MIL/uL (ref 3.87–5.11)
RDW: 12.1 % (ref 11.5–15.5)
WBC: 10 10*3/uL (ref 4.0–10.5)
nRBC: 0 % (ref 0.0–0.2)

## 2023-06-29 LAB — COMPREHENSIVE METABOLIC PANEL
ALT: 27 U/L (ref 0–44)
AST: 22 U/L (ref 15–41)
Albumin: 3.5 g/dL (ref 3.5–5.0)
Alkaline Phosphatase: 296 U/L — ABNORMAL HIGH (ref 38–126)
Anion gap: 18 — ABNORMAL HIGH (ref 5–15)
BUN: 38 mg/dL — ABNORMAL HIGH (ref 6–20)
CO2: 20 mmol/L — ABNORMAL LOW (ref 22–32)
Calcium: 9 mg/dL (ref 8.9–10.3)
Chloride: 94 mmol/L — ABNORMAL LOW (ref 98–111)
Creatinine, Ser: 1.4 mg/dL — ABNORMAL HIGH (ref 0.44–1.00)
GFR, Estimated: 46 mL/min — ABNORMAL LOW (ref >60)
Glucose, Bld: 384 mg/dL — ABNORMAL HIGH (ref 70–99)
Potassium: 4.6 mmol/L (ref 3.5–5.1)
Sodium: 132 mmol/L — ABNORMAL LOW (ref 135–145)
Total Bilirubin: 1.3 mg/dL — ABNORMAL HIGH (ref 0.0–1.2)
Total Protein: 7.8 g/dL (ref 6.5–8.1)

## 2023-06-29 LAB — LIPASE, BLOOD: Lipase: 48 U/L (ref 11–51)

## 2023-06-29 LAB — POC URINE PREG, ED: Preg Test, Ur: NEGATIVE

## 2023-06-29 MED ORDER — PROMETHAZINE HCL 25 MG PO TABS: 25.0000 mg | ORAL_TABLET | Freq: Four times a day (QID) | ORAL | 0 refills | Status: DC | PRN

## 2023-06-29 MED ORDER — ONDANSETRON HCL 4 MG/2ML IJ SOLN
4.0000 mg | Freq: Once | INTRAMUSCULAR | Status: AC
  Administered 2023-06-29: 4 mg via INTRAMUSCULAR
  Filled 2023-06-29: qty 2

## 2023-06-29 MED ORDER — ONDANSETRON HCL 4 MG/2ML IJ SOLN: 4.0000 mg | Freq: Once | INTRAMUSCULAR | Status: DC

## 2023-06-29 MED ORDER — PROMETHAZINE HCL 25 MG/ML IJ SOLN
INTRAMUSCULAR | Status: AC
  Filled 2023-06-29: qty 1

## 2023-06-29 MED ORDER — SODIUM CHLORIDE 0.9 % IV SOLN
12.5000 mg | Freq: Four times a day (QID) | INTRAVENOUS | Status: DC | PRN
  Administered 2023-06-29: 12.5 mg via INTRAVENOUS
  Filled 2023-06-29: qty 0.5

## 2023-06-29 NOTE — ED Notes (Signed)
Called x1 for room 

## 2023-06-29 NOTE — ED Provider Notes (Signed)
[place] EMERGENCY DEPARTMENT AT [facility] Provider Note   CSN: [id number] Arrival date & time: 06/28/23  2254     History  Chief Complaint  Patient presents with   Abdominal Pain    Anne Carney is a 48 y.o. female.  HPI     This is a 48 year old female who presents with ongoing nausea, vomiting, diarrhea.  She has had several days of symptoms.  She was seen and evaluated urgent care yesterday for the same.  She states she has tried Zofran at home with minimal relief.  She has not been able to tolerate much in the way of fluids.  This is been ongoing for the last 3 to 4 days.  She reports overall decreased appetite.  No fevers.  No known sick contacts.  Denies chest pain or abdominal pain.  Home Medications Prior to Admission medications   Medication Sig Start Date End Date Taking? Authorizing Provider  promethazine (PHENERGAN) 25 MG tablet Take 1 tablet (25 mg total) by mouth every 6 (six) hours as needed for nausea or vomiting. 06/29/23  Yes Izear Pine, Mayer Masker, MD  amLODipine (NORVASC) 5 MG tablet Take 1 tablet (5 mg total) by mouth daily. 03/25/23   [name], DO  blood glucose meter kit and supplies KIT Dispense based on patient and insurance preference. Use to check sugar up ro four times daily as directed. 10/24/20   Vassie Loll, MD  cetirizine (ZYRTEC) 10 MG tablet Take 1 tablet (10 mg total) by mouth 2 (two) times daily. 05/08/23 08/06/23  Alfonse Spruce, MD  Continuous Glucose Sensor (FREESTYLE LIBRE 3 PLUS SENSOR) MISC Change sensor every 15 days. 03/18/23   Dani Gobble, NP  Continuous Glucose Sensor (FREESTYLE LIBRE 3 PLUS SENSOR) MISC Change sensor every 15 days. 04/18/23   Dani Gobble, NP  famotidine (PEPCID) 20 MG tablet Take 1 tablet (20 mg total) by mouth 2 (two) times daily. 05/08/23 08/06/23  Alfonse Spruce, MD  furosemide (LASIX) 20 MG tablet Take 20 mg by mouth daily as needed. 09/28/20   [provider]   gabapentin (NEURONTIN) 100 MG capsule Take 2 capsules (200 mg total) by mouth 2 (two) times daily. 05/26/23   Everlene Other G, DO  glucose blood (CONTOUR NEXT TEST) test strip Use as instructed to monitor glucose 4 times daily. 04/03/23   Dani Gobble, NP  ibuprofen (ADVIL) 800 MG tablet Take 1 tablet (800 mg total) by mouth every 8 (eight) hours as needed for headache. 03/12/23   Bethann Berkshire, MD  insulin aspart (NOVOLOG FLEXPEN) 100 UNIT/ML FlexPen Inject 14-20 Units into the skin 3 (three) times daily with meals. 04/18/23   Dani Gobble, NP  insulin glargine (LANTUS) 100 unit/mL SOPN Inject 30 Units into the skin at bedtime. 04/18/23   Dani Gobble, NP  Insulin Pen Needle 32G X 4 MM MISC Use up to 5 times daily to administer insulin. 11/27/22   [name], DO  Iron, Ferrous Sulfate, 325 (65 Fe) MG TABS Take 325 mg by mouth every other day. 10/11/22   [name], DO  linaclotide [facility]) 145 MCG CAPS capsule Take 1 capsule (145 mcg total) by mouth daily before breakfast. 03/19/23   [name], PA-C  lisinopril-hydrochlorothiazide (ZESTORETIC) 10-12.5 MG tablet Take 1 tablet by mouth daily. 10/05/22   [name], DO  meclizine (ANTIVERT) 25 MG tablet Take 1 tablet (25 mg total) by mouth 3 (three) times daily as  needed for dizziness. 03/12/23   Bethann Berkshire, MD  ondansetron (ZOFRAN-ODT) 4 MG disintegrating tablet 4mg  ODT q4 hours prn nausea/vomit 03/12/23   Bethann Berkshire, MD  triamcinolone cream (KENALOG) 0.1 % Apply 1 Application topically 2 (two) times daily as needed. 05/17/23   Alfonse Spruce, MD      Allergies    Patient has no known allergies.    Review of Systems   Review of Systems  Constitutional:  Negative for fever.  Respiratory:  Negative for shortness of breath.   Cardiovascular:  Negative for chest pain.  Gastrointestinal:  Positive for diarrhea, nausea and vomiting. Negative for abdominal pain.  All other systems reviewed and are  negative.   Physical Exam Updated Vital Signs BP (!) 190/102   Pulse (!) 112   Temp 97.9 F (36.6 C)   Resp 16   Ht 1.651 m (5\' 5" )   Wt 87.1 kg   LMP 06/27/2023 Comment: start  SpO2 100%   BMI 31.95 kg/m  Physical Exam Vitals and nursing note reviewed.  Constitutional:      Appearance: She is well-developed.  HENT:     Head: Normocephalic and atraumatic.  Eyes:     Pupils: Pupils are equal, round, and reactive to light.  Cardiovascular:     Rate and Rhythm: Regular rhythm. Tachycardia present.     Heart sounds: Normal heart sounds.  Pulmonary:     Effort: Pulmonary effort is normal. No respiratory distress.     Breath sounds: No wheezing.  Abdominal:     General: Bowel sounds are normal.     Palpations: Abdomen is soft.     Tenderness: There is no abdominal tenderness.  Musculoskeletal:     Cervical back: Neck supple.  Skin:    General: Skin is warm and dry.  Neurological:     Mental Status: She is alert and oriented to person, place, and time.     ED Results / Procedures / Treatments   Labs (all labs ordered are listed, but only abnormal results are displayed) Labs Reviewed  COMPREHENSIVE METABOLIC PANEL - Abnormal; Notable for the following components:      Result Value   Sodium 132 (*)    Chloride 94 (*)    CO2 20 (*)    Glucose, Bld 384 (*)    BUN 38 (*)    Creatinine, Ser 1.40 (*)    Alkaline Phosphatase 296 (*)    Total Bilirubin 1.3 (*)    GFR, Estimated 46 (*)    Anion gap 18 (*)    All other components within normal limits  URINALYSIS, ROUTINE W REFLEX MICROSCOPIC - Abnormal; Notable for the following components:   APPearance HAZY (*)    Glucose, UA >=500 (*)    Hgb urine dipstick MODERATE (*)    Ketones, ur 20 (*)    Protein, ur 100 (*)    All other components within normal limits  LIPASE, BLOOD  CBC  POC URINE PREG, ED    EKG None  Radiology No results found.  Procedures Procedures    Medications Ordered in ED Medications   promethazine (PHENERGAN) 12.5 mg in sodium chloride 0.9 % 50 mL IVPB (0 mg Intravenous Stopped 06/29/23 0307)  ondansetron (ZOFRAN) injection 4 mg (4 mg Intramuscular Given 06/29/23 0120)    ED Course/ Medical Decision Making/ A&P  Medical Decision Making Amount and/or Complexity of Data Reviewed Labs: ordered.  Risk Prescription drug management.   This patient presents to the ED for concern of nausea, vomiting, diarrhea, this involves an extensive number of treatment options, and is a complaint that carries with it a high risk of complications and morbidity.  I considered the following differential and admission for this acute, potentially life threatening condition.  The differential diagnosis includes viral etiology, SBO, gastritis, gastroenteritis, colitis  MDM:    This is a 48 year old female who presents with concerns for ongoing vomiting and diarrhea.  She is overall nontoxic.  She is hypertensive and slightly tachycardic.  She states she has not been able to keep anything down.  She is initially given Zofran but subsequently given Phenergan in order to orally hydrate.  Labs are largely at her baseline with the exception of slight AKI with a creatinine of 1.4 up from around 1.1.  She was slightly hyperglycemic.  She is type II.  Do not feel she is in DKA although she clinically does appear her dry.  She is given fluids and overall states she felt somewhat better.  Will discharge home with Phenergan that she tolerated this better.  (Labs, imaging, consults)  Labs: I Ordered, and personally interpreted labs.  The pertinent results include: CBC, CMP, lipase, urinalysis  Imaging Studies ordered: I ordered imaging studies including none I independently visualized and interpreted imaging. I agree with the radiologist interpretation  Additional history obtained from chart review.  External records from outside source obtained and reviewed including prior  evaluations  Cardiac Monitoring: The patient was maintained on a cardiac monitor.  If on the cardiac monitor, I personally viewed and interpreted the cardiac monitored which showed an underlying rhythm of: Sinus  Reevaluation: After the interventions noted above, I reevaluated the patient and found that they have :improved  Social Determinants of Health:  lives independently  Disposition: Discharge  Co morbidities that complicate the patient evaluation  Past Medical History:  Diagnosis Date   Diabetes mellitus    Hypertension    Kidney disease    Neuropathy      Medicines Meds ordered this encounter  Medications   DISCONTD: ondansetron (ZOFRAN) injection 4 mg   ondansetron (ZOFRAN) injection 4 mg   promethazine (PHENERGAN) 12.5 mg in sodium chloride 0.9 % 50 mL IVPB   promethazine (PHENERGAN) 25 MG tablet    Sig: Take 1 tablet (25 mg total) by mouth every 6 (six) hours as needed for nausea or vomiting.    Dispense:  30 tablet    Refill:  0    I have reviewed the patients home medicines and have made adjustments as needed  Problem List / ED Course: Problem List Items Addressed This Visit   None Visit Diagnoses       Viral gastroenteritis    -  Primary                   Final Clinical Impression(s) / ED Diagnoses Final diagnoses:  Viral gastroenteritis    Rx / DC Orders ED Discharge Orders          Ordered    promethazine (PHENERGAN) 25 MG tablet  Every 6 hours PRN        06/29/23 0512              Shon Baton, MD 06/29/23 [phone]

## 2023-06-29 NOTE — ED Notes (Signed)
Pt says nausea meds are not working, pt has not vomited, mucous/spit noted to emesis bag- Dr Wilkie Aye made aware.

## 2023-06-29 NOTE — ED Notes (Signed)
Water given  

## 2023-06-29 NOTE — ED Notes (Signed)
Herbert Seta, [facility] aware of need for phenergan IVPB from upstairs pharmacy

## 2023-06-29 NOTE — Discharge Instructions (Signed)
You were seen today for ongoing nausea, vomiting, diarrhea.  Make sure that you are staying hydrated.  Take nausea medication as prescribed.

## 2023-06-29 NOTE — ED Notes (Addendum)
Pt family at bedside

## 2023-07-03 ENCOUNTER — Inpatient Hospital Stay (HOSPITAL_COMMUNITY)
Admission: EM | Admit: 2023-07-03 | Discharge: 2023-07-05 | DRG: 637 | Disposition: A | Discharge status: Home / Self Care | Payer: Medicaid Other | Attending: Family Medicine | Admitting: Family Medicine

## 2023-07-03 ENCOUNTER — Encounter (HOSPITAL_COMMUNITY): Payer: Self-pay | Admitting: Emergency Medicine

## 2023-07-03 ENCOUNTER — Other Ambulatory Visit: Payer: Self-pay

## 2023-07-03 DIAGNOSIS — R1115 Cyclical vomiting syndrome unrelated to migraine: Secondary | ICD-10-CM | POA: Diagnosis not present

## 2023-07-03 DIAGNOSIS — E875 Hyperkalemia: Secondary | ICD-10-CM | POA: Diagnosis not present

## 2023-07-03 DIAGNOSIS — Z794 Long term (current) use of insulin: Secondary | ICD-10-CM

## 2023-07-03 DIAGNOSIS — Z833 Family history of diabetes mellitus: Secondary | ICD-10-CM | POA: Diagnosis not present

## 2023-07-03 DIAGNOSIS — I959 Hypotension, unspecified: Secondary | ICD-10-CM | POA: Diagnosis not present

## 2023-07-03 DIAGNOSIS — K219 Gastro-esophageal reflux disease without esophagitis: Secondary | ICD-10-CM | POA: Diagnosis not present

## 2023-07-03 DIAGNOSIS — E860 Dehydration: Principal | ICD-10-CM | POA: Diagnosis present

## 2023-07-03 DIAGNOSIS — R000 Tachycardia, unspecified: Secondary | ICD-10-CM | POA: Diagnosis not present

## 2023-07-03 DIAGNOSIS — R739 Hyperglycemia, unspecified: Secondary | ICD-10-CM | POA: Diagnosis not present

## 2023-07-03 DIAGNOSIS — Z87891 Personal history of nicotine dependence: Secondary | ICD-10-CM | POA: Diagnosis not present

## 2023-07-03 DIAGNOSIS — Z825 Family history of asthma and other chronic lower respiratory diseases: Secondary | ICD-10-CM

## 2023-07-03 DIAGNOSIS — E119 Type 2 diabetes mellitus without complications: Secondary | ICD-10-CM

## 2023-07-03 DIAGNOSIS — I10 Essential (primary) hypertension: Secondary | ICD-10-CM | POA: Diagnosis not present

## 2023-07-03 DIAGNOSIS — Z79899 Other long term (current) drug therapy: Secondary | ICD-10-CM | POA: Diagnosis not present

## 2023-07-03 DIAGNOSIS — E1140 Type 2 diabetes mellitus with diabetic neuropathy, unspecified: Secondary | ICD-10-CM | POA: Diagnosis present

## 2023-07-03 DIAGNOSIS — N179 Acute kidney failure, unspecified: Secondary | ICD-10-CM | POA: Diagnosis present

## 2023-07-03 DIAGNOSIS — R410 Disorientation, unspecified: Secondary | ICD-10-CM | POA: Diagnosis not present

## 2023-07-03 DIAGNOSIS — E0810 Diabetes mellitus due to underlying condition with ketoacidosis without coma: Secondary | ICD-10-CM

## 2023-07-03 DIAGNOSIS — D509 Iron deficiency anemia, unspecified: Secondary | ICD-10-CM | POA: Diagnosis present

## 2023-07-03 DIAGNOSIS — E878 Other disorders of electrolyte and fluid balance, not elsewhere classified: Secondary | ICD-10-CM | POA: Diagnosis not present

## 2023-07-03 DIAGNOSIS — G9341 Metabolic encephalopathy: Secondary | ICD-10-CM | POA: Diagnosis not present

## 2023-07-03 DIAGNOSIS — E1110 Type 2 diabetes mellitus with ketoacidosis without coma: Principal | ICD-10-CM | POA: Diagnosis present

## 2023-07-03 DIAGNOSIS — R0902 Hypoxemia: Secondary | ICD-10-CM | POA: Diagnosis not present

## 2023-07-03 DIAGNOSIS — E1310 Other specified diabetes mellitus with ketoacidosis without coma: Secondary | ICD-10-CM

## 2023-07-03 LAB — CBG MONITORING, ED
Glucose-Capillary: >600 mg/dL (ref 70–99)
Glucose-Capillary: >600 mg/dL (ref 70–99)
Glucose-Capillary: 561 mg/dL (ref 70–99)
Glucose-Capillary: >600 mg/dL (ref 70–99)
Glucose-Capillary: 497 mg/dL — ABNORMAL HIGH (ref 70–99)
Glucose-Capillary: >600 mg/dL (ref 70–99)
Glucose-Capillary: 433 mg/dL — ABNORMAL HIGH (ref 70–99)
Glucose-Capillary: 401 mg/dL — ABNORMAL HIGH (ref 70–99)
Glucose-Capillary: 406 mg/dL — ABNORMAL HIGH (ref 70–99)
Glucose-Capillary: 313 mg/dL — ABNORMAL HIGH (ref 70–99)
Glucose-Capillary: 261 mg/dL — ABNORMAL HIGH (ref 70–99)
Glucose-Capillary: 220 mg/dL — ABNORMAL HIGH (ref 70–99)
Glucose-Capillary: 207 mg/dL — ABNORMAL HIGH (ref 70–99)
Glucose-Capillary: 157 mg/dL — ABNORMAL HIGH (ref 70–99)

## 2023-07-03 LAB — BASIC METABOLIC PANEL
Anion gap: 28 — ABNORMAL HIGH (ref 5–15)
Anion gap: 24 — ABNORMAL HIGH (ref 5–15)
Anion gap: 16 — ABNORMAL HIGH (ref 5–15)
Anion gap: 14 (ref 5–15)
Anion gap: 11 (ref 5–15)
BUN: 66 mg/dL — ABNORMAL HIGH (ref 6–20)
BUN: 63 mg/dL — ABNORMAL HIGH (ref 6–20)
BUN: 59 mg/dL — ABNORMAL HIGH (ref 6–20)
BUN: 59 mg/dL — ABNORMAL HIGH (ref 6–20)
BUN: 58 mg/dL — ABNORMAL HIGH (ref 6–20)
CO2: 13 mmol/L — ABNORMAL LOW (ref 22–32)
CO2: 17 mmol/L — ABNORMAL LOW (ref 22–32)
CO2: 24 mmol/L (ref 22–32)
CO2: 23 mmol/L (ref 22–32)
CO2: 25 mmol/L (ref 22–32)
Calcium: 8.8 mg/dL — ABNORMAL LOW (ref 8.9–10.3)
Calcium: 9.1 mg/dL (ref 8.9–10.3)
Calcium: 9 mg/dL (ref 8.9–10.3)
Calcium: 9 mg/dL (ref 8.9–10.3)
Calcium: 8.6 mg/dL — ABNORMAL LOW (ref 8.9–10.3)
Chloride: 83 mmol/L — ABNORMAL LOW (ref 98–111)
Chloride: 89 mmol/L — ABNORMAL LOW (ref 98–111)
Chloride: 91 mmol/L — ABNORMAL LOW (ref 98–111)
Chloride: 94 mmol/L — ABNORMAL LOW (ref 98–111)
Chloride: 95 mmol/L — ABNORMAL LOW (ref 98–111)
Creatinine, Ser: 1.84 mg/dL — ABNORMAL HIGH (ref 0.44–1.00)
Creatinine, Ser: 1.67 mg/dL — ABNORMAL HIGH (ref 0.44–1.00)
Creatinine, Ser: 1.45 mg/dL — ABNORMAL HIGH (ref 0.44–1.00)
Creatinine, Ser: 1.27 mg/dL — ABNORMAL HIGH (ref 0.44–1.00)
Creatinine, Ser: 1.26 mg/dL — ABNORMAL HIGH (ref 0.44–1.00)
GFR, Estimated: 33 mL/min — ABNORMAL LOW (ref >60)
GFR, Estimated: 37 mL/min — ABNORMAL LOW (ref >60)
GFR, Estimated: 44 mL/min — ABNORMAL LOW (ref >60)
GFR, Estimated: 52 mL/min — ABNORMAL LOW (ref >60)
GFR, Estimated: 52 mL/min — ABNORMAL LOW (ref >60)
Glucose, Bld: 766 mg/dL (ref 70–99)
Glucose, Bld: 441 mg/dL — ABNORMAL HIGH (ref 70–99)
Glucose, Bld: 239 mg/dL — ABNORMAL HIGH (ref 70–99)
Glucose, Bld: 192 mg/dL — ABNORMAL HIGH (ref 70–99)
Glucose, Bld: 243 mg/dL — ABNORMAL HIGH (ref 70–99)
Potassium: 5.2 mmol/L — ABNORMAL HIGH (ref 3.5–5.1)
Potassium: 3.8 mmol/L (ref 3.5–5.1)
Potassium: 3.8 mmol/L (ref 3.5–5.1)
Potassium: 3.9 mmol/L (ref 3.5–5.1)
Potassium: 4.2 mmol/L (ref 3.5–5.1)
Sodium: 124 mmol/L — ABNORMAL LOW (ref 135–145)
Sodium: 130 mmol/L — ABNORMAL LOW (ref 135–145)
Sodium: 131 mmol/L — ABNORMAL LOW (ref 135–145)
Sodium: 131 mmol/L — ABNORMAL LOW (ref 135–145)
Sodium: 131 mmol/L — ABNORMAL LOW (ref 135–145)

## 2023-07-03 LAB — CBC WITH DIFFERENTIAL/PLATELET
Abs Immature Granulocytes: 0.1 10*3/uL — ABNORMAL HIGH (ref 0.00–0.07)
Basophils Absolute: 0 10*3/uL (ref 0.0–0.1)
Basophils Relative: 0 %
Eosinophils Absolute: 0 10*3/uL (ref 0.0–0.5)
Eosinophils Relative: 0 %
HCT: 39.9 % (ref 36.0–46.0)
Hemoglobin: 13.2 g/dL (ref 12.0–15.0)
Immature Granulocytes: 1 %
Lymphocytes Relative: 6 %
Lymphs Abs: 1 10*3/uL (ref 0.7–4.0)
MCH: 30.1 pg (ref 26.0–34.0)
MCHC: 33.1 g/dL (ref 30.0–36.0)
MCV: 91.1 fL (ref 80.0–100.0)
Monocytes Absolute: 0.9 10*3/uL (ref 0.1–1.0)
Monocytes Relative: 5 %
Neutro Abs: 14.8 10*3/uL — ABNORMAL HIGH (ref 1.7–7.7)
Neutrophils Relative %: 88 %
Platelets: 226 10*3/uL (ref 150–400)
RBC: 4.38 MIL/uL (ref 3.87–5.11)
RDW: 12.4 % (ref 11.5–15.5)
WBC: 16.9 10*3/uL — ABNORMAL HIGH (ref 4.0–10.5)
nRBC: 0 % (ref 0.0–0.2)

## 2023-07-03 LAB — RENAL FUNCTION PANEL
Albumin: 3.2 g/dL — ABNORMAL LOW (ref 3.5–5.0)
Anion gap: 24 — ABNORMAL HIGH (ref 5–15)
BUN: 64 mg/dL — ABNORMAL HIGH (ref 6–20)
CO2: 17 mmol/L — ABNORMAL LOW (ref 22–32)
Calcium: 9 mg/dL (ref 8.9–10.3)
Chloride: 89 mmol/L — ABNORMAL LOW (ref 98–111)
Creatinine, Ser: 1.65 mg/dL — ABNORMAL HIGH (ref 0.44–1.00)
GFR, Estimated: 38 mL/min — ABNORMAL LOW (ref >60)
Glucose, Bld: 445 mg/dL — ABNORMAL HIGH (ref 70–99)
Phosphorus: 1.5 mg/dL — ABNORMAL LOW (ref 2.5–4.6)
Potassium: 3.8 mmol/L (ref 3.5–5.1)
Sodium: 130 mmol/L — ABNORMAL LOW (ref 135–145)

## 2023-07-03 LAB — BLOOD GAS, VENOUS
Acid-base deficit: 12.8 mmol/L — ABNORMAL HIGH (ref 0.0–2.0)
Bicarbonate: 13.9 mmol/L — ABNORMAL LOW (ref 20.0–28.0)
Drawn by: 62696
O2 Saturation: 70.3 %
Patient temperature: 36.7
pCO2, Ven: 34 mm[Hg] — ABNORMAL LOW (ref 44–60)
pH, Ven: 7.22 — ABNORMAL LOW (ref 7.25–7.43)
pO2, Ven: 43 mm[Hg] (ref 32–45)

## 2023-07-03 LAB — GLUCOSE, CAPILLARY
Glucose-Capillary: 137 mg/dL — ABNORMAL HIGH (ref 70–99)
Glucose-Capillary: 142 mg/dL — ABNORMAL HIGH (ref 70–99)
Glucose-Capillary: 173 mg/dL — ABNORMAL HIGH (ref 70–99)
Glucose-Capillary: 190 mg/dL — ABNORMAL HIGH (ref 70–99)
Glucose-Capillary: 194 mg/dL — ABNORMAL HIGH (ref 70–99)
Glucose-Capillary: 202 mg/dL — ABNORMAL HIGH (ref 70–99)
Glucose-Capillary: 210 mg/dL — ABNORMAL HIGH (ref 70–99)
Glucose-Capillary: 215 mg/dL — ABNORMAL HIGH (ref 70–99)

## 2023-07-03 LAB — BETA-HYDROXYBUTYRIC ACID
Beta-Hydroxybutyric Acid: >8 mmol/L — ABNORMAL HIGH (ref 0.05–0.27)
Beta-Hydroxybutyric Acid: 2.5 mmol/L — ABNORMAL HIGH (ref 0.05–0.27)
Beta-Hydroxybutyric Acid: 0.77 mmol/L — ABNORMAL HIGH (ref 0.05–0.27)

## 2023-07-03 LAB — HIV ANTIBODY (ROUTINE TESTING W REFLEX): HIV Screen 4th Generation wRfx: NONREACTIVE

## 2023-07-03 LAB — MRSA NEXT GEN BY PCR, NASAL: MRSA by PCR Next Gen: NOT DETECTED

## 2023-07-03 MED ORDER — POLYETHYLENE GLYCOL 3350 17 G PO PACK: 17.0000 g | PACK | Freq: Every day | ORAL | Status: DC | PRN

## 2023-07-03 MED ORDER — DEXTROSE IN LACTATED RINGERS 5 % IV SOLN: INTRAVENOUS | Status: AC

## 2023-07-03 MED ORDER — INSULIN REGULAR(HUMAN) IN NACL 100-0.9 UT/100ML-% IV SOLN
INTRAVENOUS | Status: DC
  Administered 2023-07-03: 2.2 [IU]/h via INTRAVENOUS
  Administered 2023-07-03: 13 [IU]/h via INTRAVENOUS
  Filled 2023-07-03 (×2): qty 100

## 2023-07-03 MED ORDER — LACTATED RINGERS IV BOLUS
20.0000 mL/kg | Freq: Once | INTRAVENOUS | Status: AC
  Administered 2023-07-03: 1740 mL via INTRAVENOUS

## 2023-07-03 MED ORDER — ONDANSETRON HCL 4 MG PO TABS: 4.0000 mg | ORAL_TABLET | Freq: Four times a day (QID) | ORAL | Status: DC | PRN

## 2023-07-03 MED ORDER — ACETAMINOPHEN 650 MG RE SUPP: 650.0000 mg | Freq: Four times a day (QID) | RECTAL | Status: DC | PRN

## 2023-07-03 MED ORDER — HEPARIN SODIUM (PORCINE) 5000 UNIT/ML IJ SOLN
5000.0000 [IU] | Freq: Three times a day (TID) | INTRAMUSCULAR | Status: DC
  Administered 2023-07-03 – 2023-07-05 (×6): 5000 [IU] via SUBCUTANEOUS
  Filled 2023-07-03 (×6): qty 1

## 2023-07-03 MED ORDER — ONDANSETRON HCL 4 MG/2ML IJ SOLN
4.0000 mg | Freq: Four times a day (QID) | INTRAMUSCULAR | Status: DC | PRN
  Administered 2023-07-04 (×3): 4 mg via INTRAVENOUS
  Filled 2023-07-03 (×3): qty 2

## 2023-07-03 MED ORDER — SODIUM CHLORIDE 0.9 % IV SOLN
INTRAVENOUS | Status: DC | PRN
Start: 2023-07-03 — End: 2023-07-04

## 2023-07-03 MED ORDER — DEXTROSE 50 % IV SOLN: 0.0000 mL | INTRAVENOUS | Status: DC | PRN

## 2023-07-03 MED ORDER — ONDANSETRON HCL 4 MG/2ML IJ SOLN
4.0000 mg | Freq: Once | INTRAMUSCULAR | Status: AC
  Administered 2023-07-03: 4 mg via INTRAVENOUS
  Filled 2023-07-03: qty 2

## 2023-07-03 MED ORDER — SODIUM CHLORIDE 0.9% FLUSH
3.0000 mL | INTRAVENOUS | Status: DC | PRN
Start: 2023-07-03 — End: 2023-07-04

## 2023-07-03 MED ORDER — LACTATED RINGERS IV SOLN: INTRAVENOUS | Status: DC

## 2023-07-03 MED ORDER — LABETALOL HCL 5 MG/ML IV SOLN: 10.0000 mg | INTRAVENOUS | Status: DC | PRN

## 2023-07-03 MED ORDER — GABAPENTIN 100 MG PO CAPS
200.0000 mg | ORAL_CAPSULE | Freq: Two times a day (BID) | ORAL | Status: DC
  Administered 2023-07-03 – 2023-07-05 (×5): 200 mg via ORAL
  Filled 2023-07-03 (×6): qty 2

## 2023-07-03 MED ORDER — AMLODIPINE BESYLATE 5 MG PO TABS
5.0000 mg | ORAL_TABLET | Freq: Every day | ORAL | Status: DC
  Administered 2023-07-03 – 2023-07-05 (×3): 5 mg via ORAL
  Filled 2023-07-03 (×3): qty 1

## 2023-07-03 MED ORDER — ACETAMINOPHEN 325 MG PO TABS
650.0000 mg | ORAL_TABLET | Freq: Four times a day (QID) | ORAL | Status: DC | PRN
  Administered 2023-07-04 – 2023-07-05 (×2): 650 mg via ORAL
  Filled 2023-07-03 (×2): qty 2

## 2023-07-03 MED ORDER — SODIUM CHLORIDE 0.9% FLUSH
3.0000 mL | Freq: Two times a day (BID) | INTRAVENOUS | Status: DC
  Administered 2023-07-03 – 2023-07-05 (×3): 3 mL via INTRAVENOUS

## 2023-07-03 MED ORDER — BISACODYL 10 MG RE SUPP: 10.0000 mg | Freq: Every day | RECTAL | Status: DC | PRN

## 2023-07-03 MED ORDER — FAMOTIDINE 20 MG PO TABS
20.0000 mg | ORAL_TABLET | Freq: Two times a day (BID) | ORAL | Status: DC
  Administered 2023-07-03 – 2023-07-05 (×5): 20 mg via ORAL
  Filled 2023-07-03 (×5): qty 1

## 2023-07-03 MED ORDER — SODIUM CHLORIDE 0.9% FLUSH
3.0000 mL | Freq: Two times a day (BID) | INTRAVENOUS | Status: DC
  Administered 2023-07-03 – 2023-07-04 (×3): 3 mL via INTRAVENOUS

## 2023-07-03 MED ORDER — POTASSIUM PHOSPHATES 15 MMOLE/5ML IV SOLN
30.0000 mmol | Freq: Once | INTRAVENOUS | Status: AC
  Administered 2023-07-03: 30 mmol via INTRAVENOUS
  Filled 2023-07-03: qty 10

## 2023-07-03 MED ORDER — MECLIZINE HCL 12.5 MG PO TABS
25.0000 mg | ORAL_TABLET | Freq: Three times a day (TID) | ORAL | Status: DC | PRN
  Administered 2023-07-04: 25 mg via ORAL
  Filled 2023-07-03: qty 2

## 2023-07-03 MED ORDER — TRAZODONE HCL 50 MG PO TABS: 50.0000 mg | ORAL_TABLET | Freq: Every evening | ORAL | Status: DC | PRN

## 2023-07-03 MED ORDER — CHLORHEXIDINE GLUCONATE CLOTH 2 % EX PADS
6.0000 | MEDICATED_PAD | Freq: Every day | CUTANEOUS | Status: DC
  Administered 2023-07-04 – 2023-07-05 (×2): 6 via TOPICAL

## 2023-07-03 NOTE — ED Notes (Signed)
Urine unattainable pt is resting still VS WNL

## 2023-07-03 NOTE — ED Notes (Signed)
Pharmacy was notified of missing medication (potassium phosphate)

## 2023-07-03 NOTE — ED Provider Notes (Signed)
[place] EMERGENCY DE[facility]MENT AT [facility] Provider Note   CSN: [id number] Arrival date & time: 07/03/23  0358     History  Chief Complaint  Patient presents with   Altered Mental Status   Level 5 caveat due to altered mental status Anne Carney is a 48 y.o. female.  The history is provided by the patient. The history is limited by the condition of the patient.  Patient w/history of diabetes, hypertension presents with nausea vomiting and altered mental status.  Patient reports her glucose has been elevated and she has been having vomiting and feeling confused.    Past Medical History:  Diagnosis Date   Diabetes mellitus    Hypertension    Kidney disease    Neuropathy     Home Medications Prior to Admission medications   Medication Sig Start Date End Date Taking? Authorizing Provider  amLODipine (NORVASC) 5 MG tablet Take 1 tablet (5 mg total) by mouth daily. 03/25/23   [name], DO  blood glucose meter kit and supplies KIT Dispense based on patient and insurance preference. Use to check sugar up ro four times daily as directed. 10/24/20   Vassie Loll, MD  cetirizine (ZYRTEC) 10 MG tablet Take 1 tablet (10 mg total) by mouth 2 (two) times daily. 05/08/23 08/06/23  Alfonse Spruce, MD  Continuous Glucose Sensor (FREESTYLE LIBRE 3 PLUS SENSOR) MISC Change sensor every 15 days. 03/18/23   Dani Gobble, NP  Continuous Glucose Sensor (FREESTYLE LIBRE 3 PLUS SENSOR) MISC Change sensor every 15 days. 04/18/23   Dani Gobble, NP  famotidine (PEPCID) 20 MG tablet Take 1 tablet (20 mg total) by mouth 2 (two) times daily. 05/08/23 08/06/23  Alfonse Spruce, MD  furosemide (LASIX) 20 MG tablet Take 20 mg by mouth daily as needed. 09/28/20   [provider]  gabapentin (NEURONTIN) 100 MG capsule Take 2 capsules (200 mg total) by mouth 2 (two) times daily. 05/26/23   Everlene Other G, DO  glucose blood (CONTOUR NEXT TEST) test strip Use as  instructed to monitor glucose 4 times daily. 04/03/23   Dani Gobble, NP  ibuprofen (ADVIL) 800 MG tablet Take 1 tablet (800 mg total) by mouth every 8 (eight) hours as needed for headache. 03/12/23   Bethann Berkshire, MD  insulin aspart (NOVOLOG FLEXPEN) 100 UNIT/ML FlexPen Inject 14-20 Units into the skin 3 (three) times daily with meals. 04/18/23   Dani Gobble, NP  insulin glargine (LANTUS) 100 unit/mL SOPN Inject 30 Units into the skin at bedtime. 04/18/23   Dani Gobble, NP  Insulin Pen Needle 32G X 4 MM MISC Use up to 5 times daily to administer insulin. 11/27/22   [name], DO  Iron, Ferrous Sulfate, 325 (65 Fe) MG TABS Take 325 mg by mouth every other day. 10/11/22   [name], DO  linaclotide [facility]) 145 MCG CAPS capsule Take 1 capsule (145 mcg total) by mouth daily before breakfast. 03/19/23   [name], PA-C  lisinopril-hydrochlorothiazide (ZESTORETIC) 10-12.5 MG tablet Take 1 tablet by mouth daily. 10/05/22   [name], DO  meclizine (ANTIVERT) 25 MG tablet Take 1 tablet (25 mg total) by mouth 3 (three) times daily as needed for dizziness. 03/12/23   Bethann Berkshire, MD  ondansetron (ZOFRAN-ODT) 4 MG disintegrating tablet 4mg  ODT q4 hours prn nausea/vomit 03/12/23   Bethann Berkshire, MD  promethazine (PHENERGAN) 25 MG tablet Take 1 tablet (25 mg total) by mouth  every 6 (six) hours as needed for nausea or vomiting. 06/29/23   Horton, Mayer Masker, MD  triamcinolone cream (KENALOG) 0.1 % Apply 1 Application topically 2 (two) times daily as needed. 05/17/23   Alfonse Spruce, MD      Allergies    Patient has no known allergies.    Review of Systems   Review of Systems  Unable to perform ROS: Mental status change    Physical Exam Updated Vital Signs BP (!) 149/77   Pulse (!) 114   Temp 98 F (36.7 C)   Resp 16   Ht 1.651 m (5\' 5" )   Wt 87 kg   LMP 06/27/2023 Comment: start  SpO2 100%   BMI 31.92 kg/m  Physical Exam CONSTITUTIONAL: Disheveled,  smells of ketones HEAD: Normocephalic/atraumatic EYES: EOMI/PERRL, no nystagmus ENMT: Mucous membranes dry NECK: supple no meningeal signs CV: S1/S2 noted, tachycardic LUNGS: Lungs are clear to auscultation bilaterally, no apparent distress ABDOMEN: soft, nontender, nondistended NEURO: Pt is resting comfortably but wakes up follows commands.  No facial droop.  No arm or leg drift. EXTREMITIES: pulses normal/equal, full ROM no deformities SKIN: warm, color normal   ED Results / Procedures / Treatments   Labs (all labs ordered are listed, but only abnormal results are displayed) Labs Reviewed  BASIC METABOLIC PANEL - Abnormal; Notable for the following components:      Result Value   Sodium 124 (*)    Potassium 5.2 (*)    Chloride 83 (*)    CO2 13 (*)    Glucose, Bld 766 (*)    BUN 66 (*)    Creatinine, Ser 1.84 (*)    Calcium 8.8 (*)    GFR, Estimated 33 (*)    Anion gap 28 (*)    All other components within normal limits  BETA-HYDROXYBUTYRIC ACID - Abnormal; Notable for the following components:   Beta-Hydroxybutyric Acid >8.00 (*)    All other components within normal limits  CBC WITH DIFFERENTIAL/PLATELET - Abnormal; Notable for the following components:   WBC 16.9 (*)    Neutro Abs 14.8 (*)    Abs Immature Granulocytes 0.10 (*)    All other components within normal limits  BLOOD GAS, VENOUS - Abnormal; Notable for the following components:   pH, Ven 7.22 (*)    pCO2, Ven 34 (*)    Bicarbonate 13.9 (*)    Acid-base deficit 12.8 (*)    All other components within normal limits  CBG MONITORING, ED - Abnormal; Notable for the following components:   Glucose-Capillary >600 (*)    All other components within normal limits  CBG MONITORING, ED - Abnormal; Notable for the following components:   Glucose-Capillary >600 (*)    All other components within normal limits  BASIC METABOLIC PANEL  BASIC METABOLIC PANEL  BASIC METABOLIC PANEL  BASIC METABOLIC PANEL   BETA-HYDROXYBUTYRIC ACID  BETA-HYDROXYBUTYRIC ACID  URINALYSIS, ROUTINE W REFLEX MICROSCOPIC  BASIC METABOLIC PANEL    EKG EKG Interpretation Date/Time:  Wednesday July 03 2023 04:17:46 EST Ventricular Rate:  107 PR Interval:  133 QRS Duration:  82 QT Interval:  357 QTC Calculation: 477 R Axis:   167  Text Interpretation: Right and left arm electrode reversal, interpretation assumes no reversal Sinus tachycardia Right axis deviation Low voltage, precordial leads Probable anteroseptal infarct, old Confirmed by Zadie Rhine ([id number]) on 07/03/2023 4:42:02 AM  Radiology No results found.  Procedures .Critical Care  Performed by: Zadie Rhine, MD Authorized by: Zadie Rhine, MD  Critical care provider statement:    Critical care time (minutes):  52   Critical care start time:  07/03/2023 5:00 AM   Critical care end time:  07/03/2023 5:52 AM   Critical care time was exclusive of:  Separately billable procedures and treating other patients   Critical care was necessary to treat or prevent imminent or life-threatening deterioration of the following conditions:  Metabolic crisis, endocrine crisis, dehydration and renal failure   Critical care was time spent personally by me on the following activities:  Examination of patient, pulse oximetry, ordering and review of laboratory studies, ordering and performing treatments and interventions, re-evaluation of patient's condition, evaluation of patient's response to treatment, obtaining history from patient or surrogate, review of old charts and development of treatment plan with patient or surrogate   I assumed direction of critical care for this patient from another provider in my specialty: no     Care discussed with: admitting provider       Medications Ordered in ED Medications  insulin regular, human (MYXREDLIN) 100 units/ 100 mL infusion (13 Units/hr Intravenous New Bag/Given 07/03/23 0530)  lactated ringers infusion  (has no administration in time range)  dextrose 5 % in lactated ringers infusion (0 mLs Intravenous Hold 07/03/23 0500)  dextrose 50 % solution 0-50 mL (has no administration in time range)  lactated ringers bolus 1,740 mL (1,740 mLs Intravenous New Bag/Given 07/03/23 0505)  ondansetron (ZOFRAN) injection 4 mg (4 mg Intravenous Given 07/03/23 0535)    ED Course/ Medical Decision Making/ A&P Clinical Course as of 07/03/23 0552  Wed Jul 03, 2023  0520 Patient presents with recent vomiting and elevated glucose.  Patient is in obvious DKA with significant dehydration. I discussed the case with her daughter via phone.  She reports patient has been having very little oral intake over the past week due to repetitive vomiting.  No recent falls or trauma.  No other acute issues reported [DW]  0551 Patient resting comfortably, receiving medications.  She is somnolent but easily arousable.  She is mildly confused but follows commands, no focal neurodeficits to suggest CVA. [DW]  [phone] Discussed with Dr. Lazarus Salines for admission [DW]    Clinical Course User Index [DW] Zadie Rhine, MD                                 Medical Decision Making Amount and/or Complexity of Data Reviewed Labs: ordered.  Risk Prescription drug management. Decision regarding hospitalization.   This patient presents to the ED for concern of vomiting and altered mental status, this involves an extensive number of treatment options, and is a complaint that carries with it a high risk of complications and morbidity.  The differential diagnosis includes but is not limited to CVA, intracranial hemorrhage, acute coronary syndrome, renal failure, urinary tract infection, electrolyte disturbance, pneumonia Diabetic ketoacidosis  Comorbidities that complicate the patient evaluation: Patient's presentation is complicated by their history of diabetes  Social Determinants of Health: Patient's  frequent ER visits   increases the  complexity of managing their presentation  Additional history obtained: Records reviewed previous admission documents  Lab Tests: I Ordered, and personally interpreted labs.  The pertinent results include: Diabetic ketoacidosis, acute kidney injury    Cardiac Monitoring: The patient was maintained on a cardiac monitor.  I personally viewed and interpreted the cardiac monitor which showed an underlying rhythm of:  sinus tachycardia  Medicines ordered and prescription  drug management: I ordered medication including IV fluids and insulin for dehydration and DKA Reevaluation of the patient after these medicines showed that the patient    improved  Critical Interventions:   IV fluids and insulin  Consultations Obtained: I requested consultation with the admitting physician [facility] , and discussed  findings as well as pertinent plan - they recommend: Admit  Reevaluation: After the interventions noted above, I reevaluated the patient and found that they have :improved  Complexity of problems addressed: Patient's presentation is most consistent with  acute presentation with potential threat to life or bodily function  Disposition: After consideration of the diagnostic results and the patient's response to treatment,  I feel that the patent would benefit from admission   .           Final Clinical Impression(s) / ED Diagnoses Final diagnoses:  Dehydration  Diabetic ketoacidosis without coma associated with other specified diabetes mellitus (HCC)  AKI (acute kidney injury) [facility])    Rx / DC Orders ED Discharge Orders     None         Zadie Rhine, MD 07/03/23 [phone]

## 2023-07-03 NOTE — Progress Notes (Signed)
Attempted to complete admission profile. Patient nods to name & some yes/no questions. Patient declined to answer at this time.

## 2023-07-03 NOTE — Plan of Care (Signed)
  Problem: Pain Managment: Goal: General experience of comfort will improve and/or be controlled Outcome: Progressing   Problem: Safety: Goal: Ability to remain free from injury will improve Outcome: Progressing

## 2023-07-03 NOTE — ED Notes (Signed)
Potassium Phosphate was sent with pt to the floor

## 2023-07-03 NOTE — H&P (Signed)
Patient Demographics:    Anne Carney, is a 48 y.o. female  MRN: [id number]   DOB - 1975-11-23  Admit Date - 07/03/2023  Outpatient Primary MD for the patient is [name], DO   Assessment & Plan:   Assessment and Plan:  1)DKA--patient met DKA criteria on admission with a bicarb of 13, anion gap of 28, serum glucose of 766 and beta hydroxybutyric acid of >>8.0 --VBG with a pH of 7.2 pCO2 of 34 bicarb of 13.9 base deficit of 12.8 -Treat with aggressive IV fluids, IV insulin, glucose check, frequent BMP and beta hydroxybutyric acid checks per Endo tool protocol -May switch from LR/NS to D5 solution when glucose drops below 250, per Endo tool protocol  2)Intractable nausea and vomiting----antiemetics as prescribed -IV fluids pending tolerance of oral intake -Get abdominal x-rays if nausea vomiting persist despite improvement in DKA pathophysiology  3)AKI----acute kidney injury   -BUN is up to 66, creatinine was up to 1.8 from a recent baseline usually around 1.1, -renally adjust medications, avoid nephrotoxic agents / dehydration  / hypotension  4)Leukocytosis--- WBC 16.9 suspect this is reactive in setting of DKA and intractable emesis -Awaiting UA -Consider chest x-ray if respiratory symptoms or fevers  5) hyperkalemia--potassium was 5.2 on admission should improve with insulin therapy  6) pseudohyponatremia/hypochloremia--sodium is 124 but serum glucose is 766 -Patient with intractable emesis and poor oral intake/dehydration -Anticipate normalization with IV fluids and normalization of glucose -Continue IV fluids until oral intake is reliable  7)HTN--- continue amlodipine -May use IV labetalol as needed elevated BP  8) acute metabolic encephalopathy-- -Additional history obtained from patient's  mother Anne Carney at bedside and patient's daughter Anne Carney by phone-- -suspect this is due to DKA and AKI -Consider CT head if symptoms persist despite resolution of DKA pathophysiology and adequate hydration  9) hypophosphatemia--replace and recheck  10)GERD--continue Pepcid   Status is: Inpatient  Dispo: The patient is from: Home              Anticipated d/c is to: Home              Anticipated d/c date is: 2 days              Patient currently is not medically stable to d/c. Barriers: Not Clinically Stable-   With History of - Reviewed by me  Past Medical History:  Diagnosis Date   Diabetes mellitus    Hypertension    Kidney disease    Neuropathy       Past Surgical History:  Procedure Laterality Date   BIOPSY  09/25/2021   Procedure: BIOPSY;  Surgeon: Lanelle Bal, DO;  [facility]: [facility];  Service: Endoscopy;;   CESAREAN SECTION     COLONOSCOPY WITH PROPOFOL N/A 09/25/2021   Procedure: COLONOSCOPY WITH PROPOFOL;  Surgeon: Lanelle Bal, DO;  Location: [facility];  Service: Endoscopy;  Laterality: N/A;  9:30am, ASA 3  COLONOSCOPY WITH PROPOFOL N/A 04/24/2022   Procedure: COLONOSCOPY WITH PROPOFOL;  Surgeon: Lanelle Bal, DO;  Location: [facility];  Service: Endoscopy;  Laterality: N/A;  12:00pm, asa 2   ESOPHAGOGASTRODUODENOSCOPY (EGD) WITH PROPOFOL N/A 09/25/2021   Procedure: ESOPHAGOGASTRODUODENOSCOPY (EGD) WITH PROPOFOL;  Surgeon: Lanelle Bal, DO;  Location: [facility];  Service: Endoscopy;  Laterality: N/A;   POLYPECTOMY  04/24/2022   Procedure: POLYPECTOMY INTESTINAL;  Surgeon: Lanelle Bal, DO;  Location: [facility];  Service: Endoscopy;;   TOE SURGERY Right    TOE SURGERY Left      Chief Complaint  Patient presents with   Altered Mental Status      HPI:    Anne Carney  is a 48 y.o. female with past medical history relevant for uncontrolled diabetes with neuropathy, HTN, chronic iron deficiency anemia presents to  the ED with ongoing nausea and vomiting for about a week now associated with dehydration and altered mentation -Additional history obtained from patient's mother Campo at bedside and patient's daughter Anne Carney by phone-- = Patient was previously seen in the ED on 06/28/2023 treated for nausea vomiting and discharged home on 06/29/2023 No fever  Or chills  -Emesis is without blood or bile -Initial glucose in the ED today was 766, sodium was down to 124 potassium was up to 5.2 bicarb was down to 13 creatinine was up to 1.8 from a baseline usually around 1.1, anion gap was up to 28, chloride was down to 83 BUN was up to 66 -Beta hydroxy butyrate because it was greater than 8 WBC 16.9, Hgb 13.2 platelets 226 -VBG with a pH of 7.2 pCO2 of 34 bicarb of 13.9 base deficit of 12.8   Review of systems:    In addition to the HPI above,   A full Review of  Systems was done, all other systems reviewed are negative except as noted above in HPI , .   Social History:  Reviewed by me    Social History   Tobacco Use   Smoking status: Former    Types: Cigarettes    Passive exposure: Past   Smokeless tobacco: Never  Substance Use Topics   Alcohol use: Yes    Comment: occ.     Family History :  Reviewed by me    Family History  Problem Relation Age of Onset   Asthma Mother    Huntington's disease Father    Asthma Daughter    Diabetes Daughter    Asthma Son     Home Medications:   Prior to Admission medications   Medication Sig Start Date End Date Taking? Authorizing Provider  amLODipine (NORVASC) 5 MG tablet Take 1 tablet (5 mg total) by mouth daily. 03/25/23  Yes Cook, Jayce G, DO  cetirizine (ZYRTEC) 10 MG tablet Take 1 tablet (10 mg total) by mouth 2 (two) times daily. 05/08/23 08/06/23 Yes Alfonse Spruce, MD  famotidine (PEPCID) 20 MG tablet Take 1 tablet (20 mg total) by mouth 2 (two) times daily. 05/08/23 08/06/23 Yes Alfonse Spruce, MD  furosemide (LASIX) 20 MG tablet  Take 20 mg by mouth daily as needed. 09/28/20  Yes [provider]  gabapentin (NEURONTIN) 100 MG capsule Take 2 capsules (200 mg total) by mouth 2 (two) times daily. 05/26/23  Yes Cook, Jayce G, DO  ibuprofen (ADVIL) 800 MG tablet Take 1 tablet (800 mg total) by mouth every 8 (eight) hours as needed for headache. 03/12/23  Yes Bethann Berkshire,  MD  insulin aspart (NOVOLOG FLEXPEN) 100 UNIT/ML FlexPen Inject 14-20 Units into the skin 3 (three) times daily with meals. 04/18/23  Yes Reardon, Alphonzo Lemmings J, NP  insulin glargine (LANTUS) 100 unit/mL SOPN Inject 30 Units into the skin at bedtime. 04/18/23  Yes Dani Gobble, NP  Iron, Ferrous Sulfate, 325 (65 Fe) MG TABS Take 325 mg by mouth every other day. 10/11/22  Yes Cook, Jayce G, DO  meclizine (ANTIVERT) 25 MG tablet Take 1 tablet (25 mg total) by mouth 3 (three) times daily as needed for dizziness. 03/12/23  Yes Bethann Berkshire, MD  promethazine (PHENERGAN) 25 MG tablet Take 1 tablet (25 mg total) by mouth every 6 (six) hours as needed for nausea or vomiting. 06/29/23  Yes Horton, Mayer Masker, MD  triamcinolone cream (KENALOG) 0.1 % Apply 1 Application topically 2 (two) times daily as needed. 05/17/23  Yes Alfonse Spruce, MD  blood glucose meter kit and supplies KIT Dispense based on patient and insurance preference. Use to check sugar up ro four times daily as directed. 10/24/20   Vassie Loll, MD  Continuous Glucose Sensor (FREESTYLE LIBRE 3 PLUS SENSOR) MISC Change sensor every 15 days. 03/18/23   Dani Gobble, NP  Continuous Glucose Sensor (FREESTYLE LIBRE 3 PLUS SENSOR) MISC Change sensor every 15 days. 04/18/23   Dani Gobble, NP  glucose blood (CONTOUR NEXT TEST) test strip Use as instructed to monitor glucose 4 times daily. 04/03/23   Dani Gobble, NP  Insulin Pen Needle 32G X 4 MM MISC Use up to 5 times daily to administer insulin. 11/27/22   [name], DO     Allergies:    No Known Allergies   Physical Exam:    Vitals  Blood pressure (!) 156/74, pulse (!) 109, temperature 98.7 F (37.1 C), temperature source Oral, resp. rate 18, height 5\' 5"  (1.651 m), weight 80.4 kg, last menstrual period 06/27/2023, SpO2 95%.  Physical Examination: General appearance -sleepy, easily arousable Mental status - alert, oriented to person, place, and time,  Eyes - sclera anicteric Neck - supple, no JVD elevation , Chest - clear  to auscultation bilaterally, symmetrical air movement,  Heart - S1 and S2 normal, regular  Abdomen - soft, nontender, nondistended, +BS Neurological -sleepy but arousable otherwise screening mental status exam normal, neck supple without rigidity, cranial nerves II through XII intact, DTR's normal and symmetric -Patient with generalized weakness without new focal deficits Extremities - no pedal edema noted, intact peripheral pulses , prior partial amputation of left foot Skin - warm, dry    Data Review:    CBC Recent Labs  Lab 06/29/23 0136 07/03/23 0418  WBC 10.0 16.9*  HGB 12.6 13.2  HCT 36.5 39.9  PLT 182 226  MCV 87.3 91.1  MCH 30.1 30.1  MCHC 34.5 33.1  RDW 12.1 12.4  LYMPHSABS  --  1.0  MONOABS  --  0.9  EOSABS  --  0.0  BASOSABS  --  0.0   ------------------------------------------------------------------------------------------------------------------  Chemistries  Recent Labs  Lab 06/29/23 0136 07/03/23 0418 07/03/23 0849 07/03/23 1240 07/03/23 1702  NA 132* 124* 130*  130* 131* 131*  K 4.6 5.2* 3.8  3.8 3.8 3.9  CL 94* 83* 89*  89* 91* 94*  CO2 20* 13* 17*  17* 24 23  GLUCOSE 384* 766* 445*  441* 239* 192*  BUN 38* 66* 64*  63* 59* 59*  CREATININE 1.40* 1.84* 1.65*  1.67* 1.45* 1.27*  CALCIUM 9.0 8.8*  9.0  9.1 9.0 9.0  AST 22  --   --   --   --   ALT 27  --   --   --   --   ALKPHOS 296*  --   --   --   --   BILITOT 1.3*  --   --   --   --     ------------------------------------------------------------------------------------------------------------------ estimated creatinine clearance is 56.2 mL/min (A) (by C-G formula based on SCr of 1.27 mg/dL (H)). ------------------------------------------------------------------------------------------------------------------     Component Value Date/Time   COLORURINE YELLOW 06/29/2023 0430   APPEARANCEUR HAZY (A) 06/29/2023 0430   LABSPEC 1.013 06/29/2023 0430   PHURINE 6.0 06/29/2023 0430   GLUCOSEU >=500 (A) 06/29/2023 0430   HGBUR MODERATE (A) 06/29/2023 0430   BILIRUBINUR NEGATIVE 06/29/2023 0430   BILIRUBINUR negative 04/17/2022 1758   KETONESUR 20 (A) 06/29/2023 0430   PROTEINUR 100 (A) 06/29/2023 0430   UROBILINOGEN 1.0 04/17/2022 1758   UROBILINOGEN 0.2 01/29/2015 2334   NITRITE NEGATIVE 06/29/2023 0430   LEUKOCYTESUR NEGATIVE 06/29/2023 0430  -   Imaging Results:   DVT Prophylaxis -SCD /heparin AM Labs Ordered, also please review Full Orders  Family Communication: Admission, patients condition and plan of care including tests being ordered have been discussed with the patient and mother and daughter who indicate understanding and agree with the plan   Condition  -stable  Shon Hale M.D on 07/03/2023 at 6:00 PM Go to [email or web address] -  for contact info  [facility] - Office  [phone]

## 2023-07-03 NOTE — Inpatient Diabetes Management (Addendum)
Inpatient Diabetes Program Recommendations  AACE/ADA: New Consensus Statement on Inpatient Glycemic Control   Target Ranges:  Prepandial:   less than 140 mg/dL      Peak postprandial:   less than 180 mg/dL (1-2 hours)      Critically ill patients:  140 - 180 mg/dL    Latest Reference Range & Units 07/03/23 04:11 07/03/23 05:27 07/03/23 05:57 07/03/23 06:29 07/03/23 06:57 07/03/23 07:29 07/03/23 08:11 07/03/23 08:44  Glucose-Capillary 70 - 99 mg/dL >161 (HH) >096 (HH) >045 (HH) 561 (HH) >600 (HH) 497 (H) 433 (H) 401 (H)    Latest Reference Range & Units 07/03/23 04:18  CO2 22 - 32 mmol/L 13 (L)  Glucose 70 - 99 mg/dL 409 (HH)  Anion gap 5 - 15  28 (H)    Latest Reference Range & Units 07/03/23 04:18  Beta-Hydroxybutyric Acid 0.05 - 0.27 mmol/L >8.00 (H)   Review of Glycemic Control  Diabetes history: DM2 Outpatient Diabetes medications: Lantus 30 units at bedtime, Novolog 14-20 units TID, FreeStyle Libre3 CGM Current orders for Inpatient glycemic control: IV insulin  Inpatient Diabetes Program Recommendations:    Insulin: IV insulin should be continued until acidosis has completely resolved.  Once acidosis has completely resolved, please consider ordering Semglee 22 units Q24H (based on 87 kg x 0.25 units), CBGs Q4H, and Novolog 0-15 units Q4H.  NOTE: Patient in ED and being admitted with DKA. Initial lab glucose 766 mg/dl on 01/19/90 at 4:78 am and patient has been started on IV insulin. In reviewing chart, noted patient sees W. Lurlean Leyden, NP [facility]) and was last seen on 04/18/23; per office note patient was asked to take Lantus 35 units at bedtime, Novolog 10-16 units TID, and it was noted that patient had been prescribed Ozempic but Percell Boston, NP advised her not to start the Ozempic. Noted patient seen Pola Corn, RD for outpatient DM education on 06/19/23.  Addendum 07/03/23@11 :40-Went to ED to talk with patient. Patient lying on stretcher with eyes closed. Patient  opened eyes to name. Asked patient several questions but she did not respond verbally to any of them. No family or visitors at bedside at this time. Will plan to follow up when patient appropriate for conversation.  Thanks, Orlando Penner, RN, MSN, CDCES Diabetes Coordinator Inpatient Diabetes Program [phone] (Team Pager from 8am to 5pm)

## 2023-07-03 NOTE — ED Triage Notes (Addendum)
Pt BIB EMS from home with c/o hyperglycemia and AMS. Pt c/o headache.

## 2023-07-03 NOTE — Plan of Care (Signed)
  Problem: Education: Goal: Knowledge of General Education information will improve Description: Including pain rating scale, medication(s)/side effects and non-pharmacologic comfort measures Outcome: Progressing   Problem: Health Behavior/Discharge Planning: Goal: Ability to manage health-related needs will improve Outcome: Progressing   Problem: Clinical Measurements: Goal: Ability to maintain clinical measurements within normal limits will improve Outcome: Progressing Goal: Will remain free from infection Outcome: Progressing Goal: Diagnostic test results will improve Outcome: Progressing   Problem: Activity: Goal: Risk for activity intolerance will decrease Outcome: Progressing   Problem: Nutrition: Goal: Adequate nutrition will be maintained Outcome: Progressing   Problem: Elimination: Goal: Will not experience complications related to bowel motility Outcome: Progressing Goal: Will not experience complications related to urinary retention Outcome: Progressing   Problem: Pain Managment: Goal: General experience of comfort will improve and/or be controlled Outcome: Progressing   Problem: Safety: Goal: Ability to remain free from injury will improve Outcome: Progressing

## 2023-07-03 NOTE — ED Notes (Signed)
Family updated as to patient's status. Mother at the bedside.

## 2023-07-03 NOTE — ED Notes (Signed)
Pt unable to give urine

## 2023-07-03 NOTE — ED Notes (Signed)
Pt continues to rest. She does nod when asked questions. Asked for urine nodded "no"

## 2023-07-03 NOTE — ED Notes (Signed)
Pt is currently sleeping no apparent signs of discomfort seen. Chest rise and fall was seen. Parent was at bedside.

## 2023-07-04 ENCOUNTER — Other Ambulatory Visit (HOSPITAL_COMMUNITY): Payer: Self-pay

## 2023-07-04 ENCOUNTER — Inpatient Hospital Stay (HOSPITAL_COMMUNITY): Payer: Medicaid Other

## 2023-07-04 ENCOUNTER — Encounter (HOSPITAL_COMMUNITY): Payer: Self-pay | Admitting: Pharmacy Technician

## 2023-07-04 DIAGNOSIS — R1115 Cyclical vomiting syndrome unrelated to migraine: Secondary | ICD-10-CM | POA: Diagnosis present

## 2023-07-04 DIAGNOSIS — I10 Essential (primary) hypertension: Secondary | ICD-10-CM | POA: Diagnosis not present

## 2023-07-04 DIAGNOSIS — E0810 Diabetes mellitus due to underlying condition with ketoacidosis without coma: Secondary | ICD-10-CM | POA: Diagnosis not present

## 2023-07-04 DIAGNOSIS — R1110 Vomiting, unspecified: Secondary | ICD-10-CM | POA: Diagnosis not present

## 2023-07-04 LAB — GLUCOSE, CAPILLARY
Glucose-Capillary: 147 mg/dL — ABNORMAL HIGH (ref 70–99)
Glucose-Capillary: 148 mg/dL — ABNORMAL HIGH (ref 70–99)
Glucose-Capillary: 152 mg/dL — ABNORMAL HIGH (ref 70–99)
Glucose-Capillary: 152 mg/dL — ABNORMAL HIGH (ref 70–99)
Glucose-Capillary: 158 mg/dL — ABNORMAL HIGH (ref 70–99)
Glucose-Capillary: 160 mg/dL — ABNORMAL HIGH (ref 70–99)
Glucose-Capillary: 160 mg/dL — ABNORMAL HIGH (ref 70–99)
Glucose-Capillary: 169 mg/dL — ABNORMAL HIGH (ref 70–99)
Glucose-Capillary: 185 mg/dL — ABNORMAL HIGH (ref 70–99)
Glucose-Capillary: 248 mg/dL — ABNORMAL HIGH (ref 70–99)
Glucose-Capillary: 280 mg/dL — ABNORMAL HIGH (ref 70–99)
Glucose-Capillary: 286 mg/dL — ABNORMAL HIGH (ref 70–99)

## 2023-07-04 LAB — COMPREHENSIVE METABOLIC PANEL
ALT: 14 U/L (ref 0–44)
AST: 17 U/L (ref 15–41)
Albumin: 2.5 g/dL — ABNORMAL LOW (ref 3.5–5.0)
Alkaline Phosphatase: 168 U/L — ABNORMAL HIGH (ref 38–126)
Anion gap: 7 (ref 5–15)
BUN: 45 mg/dL — ABNORMAL HIGH (ref 6–20)
CO2: 29 mmol/L (ref 22–32)
Calcium: 8.7 mg/dL — ABNORMAL LOW (ref 8.9–10.3)
Chloride: 97 mmol/L — ABNORMAL LOW (ref 98–111)
Creatinine, Ser: 1.01 mg/dL — ABNORMAL HIGH (ref 0.44–1.00)
GFR, Estimated: >60 mL/min (ref >60)
Glucose, Bld: 166 mg/dL — ABNORMAL HIGH (ref 70–99)
Potassium: 3.6 mmol/L (ref 3.5–5.1)
Sodium: 133 mmol/L — ABNORMAL LOW (ref 135–145)
Total Bilirubin: 0.7 mg/dL (ref 0.0–1.2)
Total Protein: 5.5 g/dL — ABNORMAL LOW (ref 6.5–8.1)

## 2023-07-04 LAB — CBC
HCT: 32.6 % — ABNORMAL LOW (ref 36.0–46.0)
Hemoglobin: 11.6 g/dL — ABNORMAL LOW (ref 12.0–15.0)
MCH: 30.5 pg (ref 26.0–34.0)
MCHC: 35.6 g/dL (ref 30.0–36.0)
MCV: 85.8 fL (ref 80.0–100.0)
Platelets: 190 10*3/uL (ref 150–400)
RBC: 3.8 MIL/uL — ABNORMAL LOW (ref 3.87–5.11)
RDW: 12.4 % (ref 11.5–15.5)
WBC: 10.3 10*3/uL (ref 4.0–10.5)
nRBC: 0 % (ref 0.0–0.2)

## 2023-07-04 MED ORDER — SODIUM CHLORIDE 0.9 % IV BOLUS
1000.0000 mL | Freq: Once | INTRAVENOUS | Status: AC
  Administered 2023-07-04: 1000 mL via INTRAVENOUS

## 2023-07-04 MED ORDER — LIDOCAINE VISCOUS HCL 2 % MT SOLN
15.0000 mL | Freq: Four times a day (QID) | OROMUCOSAL | Status: DC
  Administered 2023-07-04 – 2023-07-05 (×4): 15 mL via OROMUCOSAL
  Filled 2023-07-04 (×2): qty 15

## 2023-07-04 MED ORDER — DEXTROSE IN LACTATED RINGERS 5 % IV SOLN: INTRAVENOUS | Status: DC

## 2023-07-04 MED ORDER — PROMETHAZINE HCL 25 MG PO TABS: 25.0000 mg | ORAL_TABLET | Freq: Four times a day (QID) | ORAL | 0 refills | Status: DC | PRN

## 2023-07-04 MED ORDER — LIDOCAINE VISCOUS HCL 2 % MT SOLN: 15.0000 mL | Freq: Four times a day (QID) | OROMUCOSAL | 0 refills | Status: DC | PRN

## 2023-07-04 MED ORDER — NYSTATIN 100000 UNIT/ML MT SUSP
5.0000 mL | Freq: Four times a day (QID) | OROMUCOSAL | Status: DC
  Administered 2023-07-04 – 2023-07-05 (×4): 500000 [IU] via ORAL
  Filled 2023-07-04 (×4): qty 5

## 2023-07-04 MED ORDER — INSULIN ASPART 100 UNIT/ML IJ SOLN
0.0000 [IU] | Freq: Three times a day (TID) | INTRAMUSCULAR | Status: DC
  Administered 2023-07-04: 8 [IU] via SUBCUTANEOUS
  Administered 2023-07-04: 3 [IU] via SUBCUTANEOUS
  Administered 2023-07-05: 5 [IU] via SUBCUTANEOUS
  Administered 2023-07-05: 3 [IU] via SUBCUTANEOUS

## 2023-07-04 MED ORDER — NYSTATIN 100000 UNIT/ML MT SUSP: 5.0000 mL | Freq: Four times a day (QID) | OROMUCOSAL | Status: DC

## 2023-07-04 MED ORDER — IRON (FERROUS SULFATE) 325 (65 FE) MG PO TABS: 325.0000 mg | ORAL_TABLET | ORAL | 1 refills | Status: DC

## 2023-07-04 MED ORDER — INSULIN ASPART 100 UNIT/ML IJ SOLN
0.0000 [IU] | Freq: Every day | INTRAMUSCULAR | Status: DC
  Administered 2023-07-04: 2 [IU] via SUBCUTANEOUS

## 2023-07-04 MED ORDER — NYSTATIN 100000 UNIT/ML MT SUSP: 5.0000 mL | Freq: Four times a day (QID) | OROMUCOSAL | 0 refills | Status: DC

## 2023-07-04 MED ORDER — SODIUM CHLORIDE 0.9 % IV SOLN: INTRAVENOUS | Status: AC

## 2023-07-04 MED ORDER — ACETAMINOPHEN 325 MG PO TABS: 650.0000 mg | ORAL_TABLET | Freq: Four times a day (QID) | ORAL | Status: AC | PRN

## 2023-07-04 MED ORDER — INSULIN ASPART 100 UNIT/ML IJ SOLN
3.0000 [IU] | Freq: Three times a day (TID) | INTRAMUSCULAR | Status: DC
  Administered 2023-07-05 (×2): 3 [IU] via SUBCUTANEOUS

## 2023-07-04 MED ORDER — INSULIN GLARGINE-YFGN 100 UNIT/ML ~~LOC~~ SOLN
15.0000 [IU] | Freq: Every day | SUBCUTANEOUS | Status: DC
  Administered 2023-07-04: 15 [IU] via SUBCUTANEOUS
  Filled 2023-07-04 (×2): qty 0.15

## 2023-07-04 MED ORDER — MECLIZINE HCL 25 MG PO TABS: 25.0000 mg | ORAL_TABLET | Freq: Three times a day (TID) | ORAL | 0 refills | Status: AC | PRN

## 2023-07-04 MED ORDER — INSULIN GLARGINE-YFGN 100 UNIT/ML ~~LOC~~ SOLN
15.0000 [IU] | Freq: Two times a day (BID) | SUBCUTANEOUS | Status: DC
Start: 2023-07-04 — End: 2023-07-05
  Administered 2023-07-04 – 2023-07-05 (×2): 15 [IU] via SUBCUTANEOUS
  Filled 2023-07-04 (×4): qty 0.15

## 2023-07-04 NOTE — Progress Notes (Signed)
PROGRESS NOTE  Anne Carney, is a 48 y.o. female, DOB - [birth year], WNU:[id number]  Admit date - 07/03/2023   Admitting Physician Salinda Snedeker Mariea Clonts, MD  Outpatient Primary MD for the patient is [name], DO  LOS - 1  Chief Complaint  Patient presents with   Altered Mental Status      Brief Narrative:  48 y.o. female with past medical history relevant for uncontrolled diabetes with neuropathy, HTN, chronic iron deficiency anemia presents to the ED with ongoing nausea and vomiting for about a week admitted on 07/03/2023 with with dehydration and altered mentation and  DKA    -Assessment and Plan: 1)DKA--patient met DKA criteria on admission with a bicarb of 13, anion gap of 28, serum glucose of 766 and beta hydroxybutyric acid of >>8.0 --VBG with a pH of 7.2 pCO2 of 34 bicarb of 13.9 base deficit of 12.8 -Treated with aggressive IV fluids, IV insulin, glucose check, frequent BMP and beta hydroxybutyric acid checks per Endo tool protocol -DKA pathophysiology resolved and patient was weaned off of IV insulin on 07/04/2023 --Glucose was down to 166, beta hydroxy butyrate acid down to 0.77 -Anion gap is down to 7, bicarb is up to 29 -- Continue subcu insulin along with Lantus and sliding scale   2)Intractable nausea and vomiting----antiemetics as prescribed 07/04/23 -Nausea and vomiting persist---patient failed trial of oral intake -Get abdominal x-rays due to persistent nausea vomiting despite resolution of DKA pathophysiology   3)AKI----acute kidney injury   creatinine was up to 1.8 from a recent baseline usually around 1.1, 07/04/23 -Creatinine is down to 1.0 -renally adjust medications, avoid nephrotoxic agents / dehydration  / hypotension   4)Leukocytosis--- WBC 16.9 suspect this is reactive in setting of DKA and intractable emesis -UA not suggestive of UTI -Leukocytosis has resolved   5) hyperkalemia--potassium was 5.2 on admission  -Potassium normalized with insulin  therapy    6) pseudohyponatremia/hypochloremia--on admission sodium was 124 but serum glucose is 766 -Patient with intractable emesis and poor oral intake/dehydration -Sodium is up to 133 and chloride is up to 97   7)HTN--- continue amlodipine -May use IV labetalol as needed elevated BP   8) acute metabolic encephalopathy--resolved -suspect this is due to DKA and AKI -Mentation is back to baseline, patient's daughter at bedside   9) hypophosphatemia--replaced   10)GERD--continue Pepcid  Status is: Inpatient   Disposition: The patient is from: Home              Anticipated d/c is to: Home              Anticipated d/c date is: 1 day              Patient currently is not medically stable to d/c. Barriers: Not Clinically Stable-   Code Status :  -  Code Status: Full Code   Family Communication:     (patient is alert, awake and coherent)  Discussed with patient's mother and patient's daughter  DVT Prophylaxis  :   - SCDs   heparin injection 5,000 Units Start: 07/03/23 1400 SCDs Start: 07/03/23 0800 Place TED hose Start: 07/03/23 0800   Lab Results  Component Value Date   PLT 190 07/04/2023    Inpatient Medications  Scheduled Meds:  amLODipine  5 mg Oral Daily   Chlorhexidine Gluconate Cloth  6 each Topical Q0600   famotidine  20 mg Oral BID   gabapentin  200 mg Oral BID   heparin  5,000 Units Subcutaneous Q8H  insulin aspart  0-15 Units Subcutaneous TID WC   insulin aspart  0-5 Units Subcutaneous QHS   insulin aspart  3 Units Subcutaneous TID WC   insulin glargine-yfgn  15 Units Subcutaneous Daily   nystatin  5 mL Oral QID   And   lidocaine  15 mL Mouth/Throat QID   sodium chloride flush  3 mL Intravenous Q12H   sodium chloride flush  3 mL Intravenous Q12H   Continuous Infusions:  dextrose 5% lactated ringers Stopped (07/04/23 1059)   insulin Stopped (07/04/23 1059)   PRN Meds:.acetaminophen **OR** acetaminophen, bisacodyl, dextrose, labetalol, meclizine,  ondansetron **OR** ondansetron (ZOFRAN) IV, polyethylene glycol, sodium chloride flush, traZODone   Anti-infectives (From admission, onward)    None        Subjective: Cecile Sheerer today has no fevers,   No chest pain,    Nausea vomiting persist -Patient failed a trial of oral intake -Patient's daughter [name] is at bedside, questions answered  Objective: Vitals:   07/04/23 1300 07/04/23 1400 07/04/23 1600 07/04/23 1700  BP: 100/81 124/60 (!) 153/40 (!) 154/73  Pulse: (!) 111 (!) 116 (!) 119 (!) 117  Resp: 20 19 17    Temp:   99.3 F (37.4 C)   TempSrc:   Oral   SpO2: 98% 95% 100% 100%  Weight:      Height:        Intake/Output Summary (Last 24 hours) at 07/04/2023 1726 Last data filed at 07/04/2023 1703 Gross per 24 hour  Intake 4569.94 ml  Output 600 ml  Net 3969.94 ml   Filed Weights   07/03/23 0403 07/03/23 1600  Weight: 87 kg 80.4 kg    Physical Exam Gen:- Awake Alert,  in no apparent distress  HEENT:- [place].AT, No sclera icterus Neck-Supple Neck,No JVD,.  Lungs-  CTAB , fair symmetrical air movement CV- S1, S2 normal, regular  Abd-  +ve B.Sounds, Abd Soft, No tenderness,    Extremity/Skin:- No  edema, pedal pulses present, prior partial amputation of left foot  Psych-affect is appropriate, oriented x3 Neuro-no new focal deficits, no tremors  Data Reviewed: I have personally reviewed following labs and imaging studies  CBC: Recent Labs  Lab 06/29/23 0136 07/03/23 0418 07/04/23 0527  WBC 10.0 16.9* 10.3  NEUTROABS  --  14.8*  --   HGB 12.6 13.2 11.6*  HCT 36.5 39.9 32.6*  MCV 87.3 91.1 85.8  PLT 182 226 190   Basic Metabolic Panel: Recent Labs  Lab 07/03/23 0849 07/03/23 1240 07/03/23 1702 07/03/23 2019 07/04/23 0527  NA 130*  130* 131* 131* 131* 133*  K 3.8  3.8 3.8 3.9 4.2 3.6  CL 89*  89* 91* 94* 95* 97*  CO2 17*  17* 24 23 25 29   GLUCOSE 445*  441* 239* 192* 243* 166*  BUN 64*  63* 59* 59* 58* 45*  CREATININE 1.65*  1.67*  1.45* 1.27* 1.26* 1.01*  CALCIUM 9.0  9.1 9.0 9.0 8.6* 8.7*  PHOS 1.5*  --   --   --   --    GFR: Estimated Creatinine Clearance: 70.6 mL/min (A) (by C-G formula based on SCr of 1.01 mg/dL (H)). Liver Function Tests: Recent Labs  Lab 06/29/23 0136 07/03/23 0849 07/04/23 0527  AST 22  --  17  ALT 27  --  14  ALKPHOS 296*  --  168*  BILITOT 1.3*  --  0.7  PROT 7.8  --  5.5*  ALBUMIN 3.5 3.2* 2.5*   Recent Results (from the  past 240 hours)  MRSA Next Gen by PCR, Nasal     Status: None   Collection Time: 07/03/23  4:00 PM   Specimen: Nasal Mucosa; Nasal Swab  Result Value Ref Range Status   MRSA by PCR Next Gen NOT DETECTED NOT DETECTED Final    Comment: (NOTE) The GeneXpert MRSA Assay (FDA approved for NASAL specimens only), is one component of a comprehensive MRSA colonization surveillance program. It is not intended to diagnose MRSA infection nor to guide or monitor treatment for MRSA infections. Test performance is not FDA approved in patients less than 9 years old. Performed at [facility], [address], [place], [place] [address]     Scheduled Meds:  amLODipine  5 mg Oral Daily   Chlorhexidine Gluconate Cloth  6 each Topical Q0600   famotidine  20 mg Oral BID   gabapentin  200 mg Oral BID   heparin  5,000 Units Subcutaneous Q8H   insulin aspart  0-15 Units Subcutaneous TID WC   insulin aspart  0-5 Units Subcutaneous QHS   insulin aspart  3 Units Subcutaneous TID WC   insulin glargine-yfgn  15 Units Subcutaneous Daily   nystatin  5 mL Oral QID   And   lidocaine  15 mL Mouth/Throat QID   sodium chloride flush  3 mL Intravenous Q12H   sodium chloride flush  3 mL Intravenous Q12H   Continuous Infusions:  dextrose 5% lactated ringers Stopped (07/04/23 1059)   insulin Stopped (07/04/23 1059)    LOS: 1 day   Shon Hale M.D on 07/04/2023 at 5:26 PM  Go to [email or web address] - for contact info  [facility] - Office  [phone]  If 7PM-7AM, please  contact night-coverage [email or web address] 07/04/2023, 5:26 PM

## 2023-07-04 NOTE — Inpatient Diabetes Management (Addendum)
Inpatient Diabetes Program Recommendations  AACE/ADA: New Consensus Statement on Inpatient Glycemic Control  Target Ranges:  Prepandial:   less than 140 mg/dL      Peak postprandial:   less than 180 mg/dL (1-2 hours)      Critically ill patients:  140 - 180 mg/dL    Latest Reference Range & Units 07/04/23 00:37 07/04/23 01:36 07/04/23 02:38 07/04/23 03:45 07/04/23 04:36  Glucose-Capillary 70 - 99 mg/dL 660 (H) 630 (H) 160 (H) 148 (H) 158 (H)    Latest Reference Range & Units 07/03/23 04:18 07/03/23 08:49 07/03/23 12:40 07/03/23 17:02 07/03/23 20:19 07/04/23 05:27  CO2 22 - 32 mmol/L 13 (L) 17 (L) 17 (L) 24 23 25 29   Glucose 70 - 99 mg/dL 109 (HH) 323 (H) 557 (H) 239 (H) 192 (H) 243 (H) 166 (H)  Anion gap 5 - 15  28 (H) 24 (H) 24 (H) 16 (H) 14 11 7     Latest Reference Range & Units 07/03/23 04:18 07/03/23 12:40 07/03/23 20:19  Beta-Hydroxybutyric Acid 0.05 - 0.27 mmol/L >8.00 (H) 2.50 (H) 0.77 (H)   Review of Glycemic Control  Diabetes history: DM2 Outpatient Diabetes medications: Lantus 50 units at bedtime, Novolog 14-20 units TID, FreeStyle Libre3 CGM Current orders for Inpatient glycemic control: IV insulin   Inpatient Diabetes Program Recommendations:     Insulin:  Labs indicate acidosis has resolved.  Once provider is ready to transition from IV to SQ insulin, please consider ordering Semglee 26 units Q24H (based on 87 kg x 0.3 units), CBGs Q4H, and Novolog 0-15 units Q4H.   Outpatient DM: At time of discharge please provide Rx for Lantus pens (# [id number]), Novolog pens [phone]), and Baqsimi [phone]).  NOTE: Patient admitted with DKA; initial lab glucose 766 mg/dl on 3/76/28 at 3:15 am and patient has been started on IV insulin. In reviewing chart, noted patient sees W. Lurlean Leyden, NP [facility]) and was last seen on 04/18/23; per office note patient was asked to take Lantus 35 units at bedtime, Novolog 10-16 units TID, and it was noted that patient had been prescribed  Ozempic but Percell Boston, NP advised her not to start the Ozempic. Noted patient seen Pola Corn, RD for outpatient DM education on 06/19/23.   Addendum 07/04/23@14 :05-Spoke with patient over the phone regarding DM control. Patient states that she takes Lantus 50 units at bedtime and Novolog 14-20 units TID with meals for DM control. Inquired about Ozempic and patient states that she is not taking it. Patient states that prior to getting sick 4-5 days ago, her glucose had been trending ok. However once she got sick she was not taking any insulin because she was not able to eat or drink much at all. Patient states that she was using the FreeStyle Libre3 sensors but she is having an issue with insurance trying to get them. Discussed need to take insulin even when not able to eat or drink but may need to decrease insulin dosages based on recommendations from Endocrinology. Encouraged patient to reach out to Endocrinology to ask about sick day plan for when she is sick and not able to eat or drink much. Discussed initial glucose of 766 mg/dl on 1/76/16 and DKA. Discussed basic pathophysiology of DKA and treatment with IV insulin and fluids and transition from IV to SQ insulin. Again reminded patient that she will need to reach out to her Endocrinologist for amount of insulin to take for sick day plan.  Discussed hypoglycemia and treatment. Patient normally treats  hypoglycemia with juice. Patient does not have any glucagon at home to use for severe hypoglycemia or if not able to eat or drink. Discussed Baqsimi (nasal glucagon) and patient would like to get Rx for it at discharge.  Patient states that she does not think she has a lot of Lantus and Novolog left (will ask for Rx for Lantus and Novolog insulin pens) but she has plenty of insulin pen needles. Patient verbalized understanding of information discussed and has no questions at this time. Asked outpatient [facility] to check and see if FreeStyle Libre3 sensor  were covered. Informed that FreeStyle Libre3 sensor needs prior authorization and that patient's Endocrinology was already working on prior authorization for the sensors. Called patient back to let her know that it looks like her Endocrinologist is already working with insurance for the prior authorization.   Thanks, Orlando Penner, RN, MSN, CD/CES Diabetes Coordinator Inpatient Diabetes Program [phone] (Team Pager from 8am to 5pm)

## 2023-07-04 NOTE — Plan of Care (Signed)
  Problem: Health Behavior/Discharge Planning: Goal: Ability to manage health-related needs will improve Outcome: Not Progressing   Problem: Safety: Goal: Ability to remain free from injury will improve Outcome: Progressing   Problem: Metabolic: Goal: Ability to maintain appropriate glucose levels will improve Outcome: Progressing

## 2023-07-04 NOTE — Telephone Encounter (Signed)
 ERROR

## 2023-07-04 NOTE — Plan of Care (Signed)

## 2023-07-04 NOTE — Discharge Instructions (Signed)
1)The 'BRAT' diet is suggested, then progress to diet as tolerated as symptoms abate.  -- BRAT (bananas, rice, apples, toast) -you may also consume other mild foods that ease the GI tract such as saltines, oatmeal, or boiled potatoes. Call if bloody stools, persistent diarrhea, vomiting, fever or abdominal pain.  2) take antiemetic medications as prescribed for nausea and vomiting

## 2023-07-04 NOTE — Progress Notes (Signed)
   07/04/23 0913  TOC Brief Assessment  Insurance and Status Reviewed  Patient has primary care physician Yes  Home environment has been reviewed from home  Prior level of function: independent  Prior/Current Home Services No current home services  Social Drivers of Health Review SDOH reviewed no interventions necessary  Readmission risk has been reviewed Yes  Transition of care needs no transition of care needs at this time    Transition of Care Department [facility]) has reviewed patient and no TOC needs have been identified at this time. We will continue to monitor patient advancement through interdisciplinary progression rounds. If new patient transition needs arise, please place a TOC consult.

## 2023-07-04 NOTE — Progress Notes (Signed)
61 Endotool suggested Provider be notified to transition off. MD notified. No orders received.   0440 Patients D5LR to expire at 0500. MD notified and asked to update order. No orders received.

## 2023-07-05 DIAGNOSIS — R1115 Cyclical vomiting syndrome unrelated to migraine: Secondary | ICD-10-CM

## 2023-07-05 DIAGNOSIS — I10 Essential (primary) hypertension: Secondary | ICD-10-CM | POA: Diagnosis not present

## 2023-07-05 DIAGNOSIS — E0810 Diabetes mellitus due to underlying condition with ketoacidosis without coma: Secondary | ICD-10-CM | POA: Diagnosis not present

## 2023-07-05 LAB — BASIC METABOLIC PANEL
Anion gap: 6 (ref 5–15)
BUN: 32 mg/dL — ABNORMAL HIGH (ref 6–20)
CO2: 26 mmol/L (ref 22–32)
Calcium: 8 mg/dL — ABNORMAL LOW (ref 8.9–10.3)
Chloride: 101 mmol/L (ref 98–111)
Creatinine, Ser: 0.97 mg/dL (ref 0.44–1.00)
GFR, Estimated: >60 mL/min (ref >60)
Glucose, Bld: 198 mg/dL — ABNORMAL HIGH (ref 70–99)
Potassium: 3.7 mmol/L (ref 3.5–5.1)
Sodium: 133 mmol/L — ABNORMAL LOW (ref 135–145)

## 2023-07-05 LAB — GLUCOSE, CAPILLARY
Glucose-Capillary: 222 mg/dL — ABNORMAL HIGH (ref 70–99)
Glucose-Capillary: 200 mg/dL — ABNORMAL HIGH (ref 70–99)

## 2023-07-05 MED ORDER — PROCHLORPERAZINE 25 MG RE SUPP
25.0000 mg | Freq: Once | RECTAL | Status: AC
  Administered 2023-07-05: 25 mg via RECTAL
  Filled 2023-07-05: qty 1

## 2023-07-05 MED ORDER — METOPROLOL TARTRATE 25 MG PO TABS
25.0000 mg | ORAL_TABLET | Freq: Two times a day (BID) | ORAL | Status: DC
  Administered 2023-07-05: 25 mg via ORAL
  Filled 2023-07-05: qty 1

## 2023-07-05 MED ORDER — METOPROLOL TARTRATE 25 MG PO TABS: 25.0000 mg | ORAL_TABLET | Freq: Two times a day (BID) | ORAL | 5 refills | Status: DC

## 2023-07-05 NOTE — Discharge Summary (Signed)
Anne Carney, is a 48 y.o. female  DOB 09/21/1975  MRN [id number].  Admission date:  07/03/2023  Admitting Physician  Shon Hale, MD  Discharge Date:  07/05/2023   Primary MD  [name], DO  Recommendations for primary care physician for things to follow:  1)The 'BRAT' diet is suggested, then progress to diet as tolerated as symptoms abate.  -- BRAT (bananas, rice, apples, toast) -you may also consume other mild foods that ease the GI tract such as saltines, oatmeal, or boiled potatoes. Call if bloody stools, persistent diarrhea, vomiting, fever or abdominal pain.   2)Take antiemetic medications as prescribed for nausea and vomiting  Admission Diagnosis  Dehydration [E86.0] DKA (diabetic ketoacidosis) (HCC) [E11.10] AKI (acute kidney injury) (HCC) [N17.9] Diabetic ketoacidosis without coma associated with other specified diabetes mellitus (HCC) [E13.10]   Discharge Diagnosis  Dehydration [E86.0] DKA (diabetic ketoacidosis) (HCC) [E11.10] AKI (acute kidney injury) (HCC) [N17.9] Diabetic ketoacidosis without coma associated with other specified diabetes mellitus (HCC) [E13.10]    Principal Problem:   DKA (diabetic ketoacidosis) (HCC) Active Problems:   Emesis, persistent   DM type 2 (diabetes mellitus, type 2) (HCC)   Primary hypertension     Past Medical History:  Diagnosis Date   Diabetes mellitus    Hypertension    Kidney disease    Neuropathy    Past Surgical History:  Procedure Laterality Date   BIOPSY  09/25/2021   Procedure: BIOPSY;  Surgeon: Lanelle Bal, DO;  [facility];  Service: Endoscopy;;   CESAREAN SECTION     COLONOSCOPY WITH PROPOFOL N/A 09/25/2021   Procedure: COLONOSCOPY WITH PROPOFOL;  Surgeon: Lanelle Bal, DO;  [facility]: [facility];  Service: Endoscopy;  Laterality: N/A;  9:30am, ASA 3   COLONOSCOPY WITH PROPOFOL N/A 04/24/2022    Procedure: COLONOSCOPY WITH PROPOFOL;  Surgeon: Lanelle Bal, DO;  [facility];  Service: Endoscopy;  Laterality: N/A;  12:00pm, asa 2   ESOPHAGOGASTRODUODENOSCOPY (EGD) WITH PROPOFOL N/A 09/25/2021   Procedure: ESOPHAGOGASTRODUODENOSCOPY (EGD) WITH PROPOFOL;  Surgeon: Lanelle Bal, DO;  [facility];  Service: Endoscopy;  Laterality: N/A;   POLYPECTOMY  04/24/2022   Procedure: POLYPECTOMY INTESTINAL;  Surgeon: Lanelle Bal, DO;  [facility]: [facility];  Service: Endoscopy;;   TOE SURGERY Right    TOE SURGERY Left      HPI  from the history and physical done on the day of admission:    Anne Carney  is a 48 y.o. female with past medical history relevant for uncontrolled diabetes with neuropathy, HTN, chronic iron deficiency anemia presents to the ED with ongoing nausea and vomiting for about a week now associated with dehydration and altered mentation -Additional history obtained from patient's mother Anne Carney at bedside and patient's daughter Anne Carney by phone-- = Patient was previously seen in the ED on 06/28/2023 treated for nausea vomiting and discharged home on 06/29/2023 No fever  Or chills  -Emesis is without blood or bile -Initial glucose in the ED today  was 766, sodium was down to 124 potassium was up to 5.2 bicarb was down to 13 creatinine was up to 1.8 from a baseline usually around 1.1, anion gap was up to 28, chloride was down to 83 BUN was up to 66 -Beta hydroxy butyrate because it was greater than 8 WBC 16.9, Hgb 13.2 platelets 226 -VBG with a pH of 7.2 pCO2 of 34 bicarb of 13.9 base deficit of 12.8   Hospital Course:   Brief Narrative:  48 y.o. female with past medical history relevant for uncontrolled diabetes with neuropathy, HTN, chronic iron deficiency anemia presents to the ED with ongoing nausea and vomiting for about a week admitted on 07/03/2023 with with dehydration and altered mentation and  DKA     -Assessment and  Plan: 1)DKA--patient met DKA criteria on admission with a bicarb of 13, anion gap of 28, serum glucose of 766 and beta hydroxybutyric acid of >>8.0 --VBG with a pH of 7.2 pCO2 of 34 bicarb of 13.9 base deficit of 12.8 -Treated with aggressive IV fluids, IV insulin, glucose check, frequent BMP and beta hydroxybutyric acid checks per Endo tool protocol -DKA pathophysiology resolved and patient was weaned off of IV insulin on 07/04/2023 --Glucose was down to 166, beta hydroxy butyrate acid down to 0.77 -Anion gap is down to 7, bicarb is up to 29 -- Continue subcu insulin along with Lantus and sliding scale   2)Intractable nausea and vomiting----antiemetics as prescribed 07/05/23 -abdominal x-rays obtained on 07/04/2023 due to persistent nausea vomiting despite resolution of DKA pathophysiology -Chest and abdominal x-rays without acute findings -No further emesis, patient tolerating oral intake well   3)AKI----acute kidney injury   creatinine was up to 1.8 from a recent baseline usually around 1.1, -Creatinine is down to 0.97 -renally adjust medications, avoid nephrotoxic agents / dehydration  / hypotension   4)Leukocytosis--- WBC 16.9 suspect this is reactive in setting of DKA and intractable emesis -UA not suggestive of UTI -Leukocytosis has resolved   5) hyperkalemia--potassium was 5.2 on admission  -Potassium normalized with insulin therapy    6)Pseudohyponatremia/hypochloremia--on admission sodium was 124 but serum glucose is 766 -Patient with intractable emesis and poor oral intake/dehydration -Sodium is up to 133 and chloride is up to 101   7)HTN--- BP is not at goal, heart rate borderline high  -continue amlodipine -Add metoprolol 25 twice daily   8) acute metabolic encephalopathy--resolved -suspect this is due to DKA and AKI -Mentation is back to baseline as per patient's mother and daughter  42) hypophosphatemia--replaced   10)GERD--continue Pepcid   Disposition: The  patient is from: Home              Anticipated d/c is to: Home  Discharge Condition: stable  Follow UP   Follow-up Information     [name], DO. Schedule an appointment as soon as possible for a visit in 1 week(s).   Specialty: Family Medicine Contact information: [address] [place] [address] [phone]                 Diet and Activity recommendation:  As advised  Discharge Instructions    Discharge Instructions     Call MD for:  difficulty breathing, headache or visual disturbances   Complete by: As directed    Call MD for:  persistant dizziness or light-headedness   Complete by: As directed    Call MD for:  persistant nausea and vomiting   Complete by: As directed  Call MD for:  severe uncontrolled pain   Complete by: As directed    Call MD for:  temperature >100.4   Complete by: As directed    Diet - low sodium heart healthy   Complete by: As directed    Diet Carb Modified   Complete by: As directed    Discharge instructions   Complete by: As directed    1)The 'BRAT' diet is suggested, then progress to diet as tolerated as symptoms abate.  -- BRAT (bananas, rice, apples, toast) -you may also consume other mild foods that ease the GI tract such as saltines, oatmeal, or boiled potatoes. Call if bloody stools, persistent diarrhea, vomiting, fever or abdominal pain.  2) take antiemetic medications as prescribed for nausea and vomiting   Increase activity slowly   Complete by: As directed        Discharge Medications     Allergies as of 07/05/2023   No Known Allergies      Medication List     STOP taking these medications    ibuprofen 800 MG tablet Commonly known as: ADVIL       TAKE these medications    acetaminophen 325 MG tablet Commonly known as: TYLENOL Take 2 tablets (650 mg total) by mouth every 6 (six) hours as needed for mild pain (pain score 1-3) (or Fever >/= 101).   amLODipine 5 MG tablet Commonly known  as: NORVASC Take 1 tablet (5 mg total) by mouth daily.   blood glucose meter kit and supplies Kit Dispense based on patient and insurance preference. Use to check sugar up ro four times daily as directed.   cetirizine 10 MG tablet Commonly known as: ZYRTEC Take 1 tablet (10 mg total) by mouth 2 (two) times daily.   Contour Next Test test strip Generic drug: glucose blood Use as instructed to monitor glucose 4 times daily.   famotidine 20 MG tablet Commonly known as: Pepcid Take 1 tablet (20 mg total) by mouth 2 (two) times daily.   FreeStyle Libre 3 Plus Sensor Misc Change sensor every 15 days.   FreeStyle Libre 3 Plus Sensor Misc Change sensor every 15 days.   furosemide 20 MG tablet Commonly known as: LASIX Take 20 mg by mouth daily as needed.   gabapentin 100 MG capsule Commonly known as: NEURONTIN Take 2 capsules (200 mg total) by mouth 2 (two) times daily.   insulin glargine 100 unit/mL Sopn Commonly known as: LANTUS Inject 30 Units into the skin at bedtime. What changed: how much to take   Insulin Pen Needle 32G X 4 MM Misc Use up to 5 times daily to administer insulin.   Iron (Ferrous Sulfate) 325 (65 Fe) MG Tabs Take 325 mg by mouth every other day.   lidocaine 2 % solution Commonly known as: XYLOCAINE Use as directed 15 mLs in the mouth or throat every 6 (six) hours as needed for mouth pain (mouth and Throat pain).   meclizine 25 MG tablet Commonly known as: ANTIVERT Take 1 tablet (25 mg total) by mouth 3 (three) times daily as needed for dizziness.   metoprolol tartrate 25 MG tablet Commonly known as: LOPRESSOR Take 1 tablet (25 mg total) by mouth 2 (two) times daily.   NovoLOG FlexPen 100 UNIT/ML FlexPen Generic drug: insulin aspart Inject 14-20 Units into the skin 3 (three) times daily with meals.   nystatin 100000 UNIT/ML suspension Commonly known as: MYCOSTATIN Take 5 mLs (500,000 Units total) by mouth 4 (four) times daily.  promethazine  25 MG tablet Commonly known as: PHENERGAN Take 1 tablet (25 mg total) by mouth every 6 (six) hours as needed for nausea or vomiting.   triamcinolone cream 0.1 % Commonly known as: KENALOG Apply 1 Application topically 2 (two) times daily as needed.       Major procedures and Radiology Reports - PLEASE review detailed and final reports for all details, in brief -   DG ABD ACUTE 2+V W 1V CHEST Result Date: 07/04/2023 CLINICAL DATA:  Persistent emesis EXAM: DG ABDOMEN ACUTE WITH 1 VIEW CHEST COMPARISON:  10/23/2020 FINDINGS: Supine and upright frontal views of the abdomen and pelvis as well as an upright frontal view of the chest are obtained. The cardiac silhouette is unremarkable. No airspace disease, effusion, or pneumothorax. No bowel obstruction or ileus. No masses or abnormal calcifications within the abdomen. No free intraperitoneal gas. No acute bony abnormalities. IMPRESSION: 1. Unremarkable abdominal series. Electronically Signed   By: Sharlet Salina M.D.   On: 07/04/2023 19:16   US BIOPSY (LIVER) Result Date: 06/07/2023 INDICATION: Elevated liver function tests and need for random liver biopsy. EXAM: ULTRASOUND GUIDED CORE BIOPSY OF LIVER MEDICATIONS: None. ANESTHESIA/SEDATION: Moderate (conscious) sedation was employed during this procedure. A total of Versed 1.5 mg and Fentanyl 50 mcg was administered intravenously. Moderate Sedation Time: 18 minutes. The patient's level of consciousness and vital signs were monitored continuously by radiology nursing throughout the procedure under my direct supervision. PROCEDURE: The procedure, risks, benefits, and alternatives were explained to the patient. Questions regarding the procedure were encouraged and answered. The patient understands and consents to the procedure. A time-out was performed prior to initiating the procedure. The abdominal wall was prepped with chlorhexidine in a sterile fashion, and a sterile drape was applied covering the  operative field. A sterile gown and sterile gloves were used for the procedure. Local anesthesia was provided with 1% Lidocaine. The liver was localized by ultrasound. A 17 gauge trocar needle was advanced into the right lobe under ultrasound guidance. After confirming needle tip position, 3 separate coaxial 18 gauge core biopsy samples were obtained. Core biopsy samples were submitted in formalin. Three separate Gel-Foam torpedoes were advanced through the outer needle as the needle was retracted and removed. Additional ultrasound was performed. COMPLICATIONS: None immediate. FINDINGS: Solid core biopsy samples were obtained from the liver. IMPRESSION: Ultrasound-guided core biopsy performed of right lobe liver parenchyma. Electronically Signed   By: [name] M.D.   On: 06/07/2023 08:06   Micro Results   Recent Results (from the past 240 hours)  MRSA Next Gen by PCR, Nasal     Status: None   Collection Time: 07/03/23  4:00 PM   Specimen: Nasal Mucosa; Nasal Swab  Result Value Ref Range Status   MRSA by PCR Next Gen NOT DETECTED NOT DETECTED Final    Comment: (NOTE) The GeneXpert MRSA Assay (FDA approved for NASAL specimens only), is one component of a comprehensive MRSA colonization surveillance program. It is not intended to diagnose MRSA infection nor to guide or monitor treatment for MRSA infections. Test performance is not FDA approved in patients less than 101 years old. Performed at [facility] [address] [place], [place] [address]    Today   Subjective    Anne Carney today has no new complaints    No fever  Or chills  -No further emesis -Eating and drinking well -Voiding well  Patient has been seen and examined prior to discharge   Objective  Blood pressure (!) 156/77, pulse (!) 113, temperature 99.9 F (37.7 C), temperature source Axillary, resp. rate 20, height 5\' 5"  (1.651 m), weight 83.8 kg, last menstrual period 06/27/2023, SpO2 98%.   Intake/Output  Summary (Last 24 hours) at 07/05/2023 1112 Last data filed at 07/05/2023 0808 Gross per 24 hour  Intake 3343.03 ml  Output --  Net 3343.03 ml   Exam Gen:- Awake Alert, no acute distress  HEENT:- [place].AT, No sclera icterus Neck-Supple Neck,No JVD,.  Lungs-  CTAB , good air movement bilaterally CV- S1, S2 normal, regular Abd-  +ve B.Sounds, Abd Soft, No tenderness,    Extremity/Skin:-  No  edema, pedal pulses present, prior partial amputation of left foot  Psych-affect is appropriate, oriented x3 Neuro-no new focal deficits, no tremors    Data Review   CBC w Diff:  Lab Results  Component Value Date   WBC 10.3 07/04/2023   HGB 11.6 (L) 07/04/2023   HGB 11.5 09/04/2021   HCT 32.6 (L) 07/04/2023   HCT 36.3 09/04/2021   PLT 190 07/04/2023   PLT 142 (L) 09/04/2021   LYMPHOPCT 6 07/03/2023   MONOPCT 5 07/03/2023   EOSPCT 0 07/03/2023   BASOPCT 0 07/03/2023   CMP:  Lab Results  Component Value Date   NA 133 (L) 07/05/2023   NA 132 (L) 09/04/2021   K 3.7 07/05/2023   CL 101 07/05/2023   CO2 26 07/05/2023   BUN 32 (H) 07/05/2023   BUN 21 09/04/2021   CREATININE 0.97 07/05/2023   CREATININE 0.87 10/19/2022   PROT 5.5 (L) 07/04/2023   PROT 6.8 01/11/2022   ALBUMIN 2.5 (L) 07/04/2023   ALBUMIN 3.9 01/11/2022   BILITOT 0.7 07/04/2023   BILITOT 0.5 01/11/2022   ALKPHOS 168 (H) 07/04/2023   AST 17 07/04/2023   ALT 14 07/04/2023   Total Discharge time is about 33 minutes  Shon Hale M.D on 07/05/2023 at 11:12 AM  Go to [email or web address] -  for contact info  [facility] - Office  [phone]

## 2023-07-05 NOTE — Progress Notes (Signed)
Patient remains unsteady with ambulation. Max assist to [facility]. Pt complains of dizziness with movement.

## 2023-07-08 ENCOUNTER — Encounter: Payer: Self-pay | Admitting: Neurology

## 2023-07-08 ENCOUNTER — Ambulatory Visit: Payer: Medicaid Other | Admitting: Neurology

## 2023-07-16 ENCOUNTER — Ambulatory Visit: Payer: Medicaid Other | Admitting: Neurology

## 2023-07-16 ENCOUNTER — Encounter: Payer: Self-pay | Admitting: Neurology

## 2023-07-16 VITALS — BP 146/83 | HR 116 | Ht 65.0 in | Wt 171.5 lb

## 2023-07-16 DIAGNOSIS — Z794 Long term (current) use of insulin: Secondary | ICD-10-CM | POA: Diagnosis not present

## 2023-07-16 DIAGNOSIS — E1142 Type 2 diabetes mellitus with diabetic polyneuropathy: Secondary | ICD-10-CM | POA: Diagnosis not present

## 2023-07-16 NOTE — Progress Notes (Signed)
 [facility]  PATIENT: Anne Carney DOB: [birth year]  REQUESTING CLINICIAN: Cook, Jayce G, DO HISTORY FROM: Patient/Chart review  REASON FOR VISIT: left hand numbness    HISTORICAL  CHIEF COMPLAINT:  Chief Complaint  Patient presents with   New Patient (Initial Visit)    Rm13, alone, NP internal referral for severe headache: pt reported 15 days out of 30, triggers for ha: light, sound, smell, stress. left sided weakness ? TIA:pt reported left hand weakness and dexterity issues beginning in october    HISTORY OF PRESENT ILLNESS:  This is a 48 year old woman with past medical history of uncontrolled diabetes, hypertension, kidney disease, neuropathy who is presenting to the clinic with complaints of headaches and lost of feeling in the left hand back in October 2024. At that time, her MRI was negative for acute stroke. She reports another event in January when she had nausea and vomiting, diagnosed with viral illness, then a few days later admitted for DKA.  She denies any weakness, but tells me that her left hand numbness is intermittent and worse when she is sick.     OTHER MEDICAL CONDITIONS: Diabetes Mellitus, Hypertension, Kidney disease   REVIEW OF SYSTEMS: Full 14 system review of systems performed and negative with exception of: as noted in the HPI   ALLERGIES: No Known Allergies  HOME MEDICATIONS: Outpatient Medications Prior to Visit  Medication Sig Dispense Refill   acetaminophen  (TYLENOL ) 325 MG tablet Take 2 tablets (650 mg total) by mouth every 6 (six) hours as needed for mild pain (pain score 1-3) (or Fever >/= 101).     amLODipine  (NORVASC ) 5 MG tablet Take 1 tablet (5 mg total) by mouth daily. 90 tablet 1   blood glucose meter kit and supplies KIT Dispense based on patient and insurance preference. Use to check sugar up ro four times daily as directed. 1 each 0   cetirizine  (ZYRTEC ) 10 MG tablet Take 1 tablet (10 mg total) by mouth 2  (two) times daily. 180 tablet 0   Continuous Glucose Sensor (FREESTYLE LIBRE 3 PLUS SENSOR) MISC Change sensor every 15 days. 2 each 3   Continuous Glucose Sensor (FREESTYLE LIBRE 3 PLUS SENSOR) MISC Change sensor every 15 days. 6 each 3   famotidine  (PEPCID ) 20 MG tablet Take 1 tablet (20 mg total) by mouth 2 (two) times daily. 180 tablet 0   furosemide  (LASIX ) 20 MG tablet Take 20 mg by mouth daily as needed.     gabapentin  (NEURONTIN ) 100 MG capsule Take 2 capsules (200 mg total) by mouth 2 (two) times daily. 360 capsule 3   glucose blood (CONTOUR NEXT TEST) test strip Use as instructed to monitor glucose 4 times daily. 400 each 6   insulin  aspart (NOVOLOG  FLEXPEN) 100 UNIT/ML FlexPen Inject 14-20 Units into the skin 3 (three) times daily with meals. 30 mL 3   insulin  glargine (LANTUS ) 100 unit/mL SOPN Inject 30 Units into the skin at bedtime. (Patient taking differently: Inject 50 Units into the skin at bedtime.) 18 mL 6   Insulin  Pen Needle 32G X 4 MM MISC Use up to 5 times daily to administer insulin . 200 each 3   Iron , Ferrous Sulfate , 325 (65 Fe) MG TABS Take 325 mg by mouth every other day. 45 tablet 1   lidocaine  (XYLOCAINE ) 2 % solution Use as directed 15 mLs in the mouth or throat every 6 (six) hours as needed for mouth pain (mouth and Throat pain). 100 mL 0  meclizine  (ANTIVERT ) 25 MG tablet Take 1 tablet (25 mg total) by mouth 3 (three) times daily as needed for dizziness. 30 tablet 0   metoprolol  tartrate (LOPRESSOR ) 25 MG tablet Take 1 tablet (25 mg total) by mouth 2 (two) times daily. 60 tablet 5   nystatin  (MYCOSTATIN ) 100000 UNIT/ML suspension Take 5 mLs (500,000 Units total) by mouth 4 (four) times daily. 60 mL 0   promethazine  (PHENERGAN ) 25 MG tablet Take 1 tablet (25 mg total) by mouth every 6 (six) hours as needed for nausea or vomiting. 30 tablet 0   triamcinolone  cream (KENALOG ) 0.1 % Apply 1 Application topically 2 (two) times daily as needed. 454 g 1   No  facility-administered medications prior to visit.    PAST MEDICAL HISTORY: Past Medical History:  Diagnosis Date   Diabetes mellitus    Hypertension    Kidney disease    Neuropathy     PAST SURGICAL HISTORY: Past Surgical History:  Procedure Laterality Date   BIOPSY  09/25/2021   Procedure: BIOPSY;  Surgeon: [name], DO;  Location: [facility];  Service: Endoscopy;;   CESAREAN SECTION     COLONOSCOPY WITH PROPOFOL  N/A 09/25/2021   Procedure: COLONOSCOPY WITH PROPOFOL ;  Surgeon: [name], DO;  Location: [facility];  Service: Endoscopy;  Laterality: N/A;  9:30am, ASA 3   COLONOSCOPY WITH PROPOFOL  N/A 04/24/2022   Procedure: COLONOSCOPY WITH PROPOFOL ;  Surgeon: [name], DO;  Location: [facility];  Service: Endoscopy;  Laterality: N/A;  12:00pm, asa 2   ESOPHAGOGASTRODUODENOSCOPY (EGD) WITH PROPOFOL  N/A 09/25/2021   Procedure: ESOPHAGOGASTRODUODENOSCOPY (EGD) WITH PROPOFOL ;  Surgeon: [name], DO;  Location: [facility];  Service: Endoscopy;  Laterality: N/A;   POLYPECTOMY  04/24/2022   Procedure: POLYPECTOMY INTESTINAL;  Surgeon: [name], DO;  Location: [facility];  Service: Endoscopy;;   TOE SURGERY Right    TOE SURGERY Left     FAMILY HISTORY: Family History  Problem Relation Age of Onset   Asthma Mother    Huntington's disease Father    Asthma Daughter    Diabetes Daughter    Asthma Son     SOCIAL HISTORY: Social History   Socioeconomic History   Marital status: Divorced    Spouse name: Not on file   Number of children: Not on file   Years of education: Not on file   Highest education level: Not on file  Occupational History   Not on file  Tobacco Use   Smoking status: Former    Types: Cigarettes    Passive exposure: Past   Smokeless tobacco: Never  Vaping Use   Vaping status: Never Used  Substance and Sexual Activity   Alcohol use: Yes    Comment: occ.   Drug use: Not Currently    Types: Marijuana     Comment: occasionally   Sexual activity: Yes    Birth control/protection: None  Other Topics Concern   Not on file  Social History Narrative   Not on file   Social Drivers of Health   Financial Resource Strain: Not on file  Food Insecurity: Patient Unable To Answer (07/03/2023)   Hunger Vital Sign    Worried About Running Out of Food in the Last Year: Patient unable to answer    Ran Out of Food in the Last Year: Patient unable to answer  Transportation Needs: Patient Unable To Answer (07/03/2023)   PRAPARE - Transportation    Lack of  Transportation (Medical): Patient unable to answer    Lack of Transportation (Non-Medical): Patient unable to answer  Physical Activity: Not on file  Stress: Not on file  Social Connections: Not on file  Intimate Partner Violence: Patient Unable To Answer (07/03/2023)   Humiliation, Afraid, Rape, and Kick questionnaire    Fear of Current or Ex-Partner: Patient unable to answer    Emotionally Abused: Patient unable to answer    Physically Abused: Patient unable to answer    Sexually Abused: Patient unable to answer     PHYSICAL EXAM  GENERAL EXAM/CONSTITUTIONAL: Vitals:  Vitals:   07/16/23 1255  BP: (!) 146/83  Pulse: (!) 116  Weight: 171 lb 8 oz (77.8 kg)  Height: 5' 5 (1.651 m)   Body mass index is 28.54 kg/m. Wt Readings from Last 3 Encounters:  07/16/23 171 lb 8 oz (77.8 kg)  07/05/23 184 lb 11.9 oz (83.8 kg)  06/28/23 192 lb (87.1 kg)   Patient is in no distress; well developed, poorly groomed; neck is supple  MUSCULOSKELETAL: Gait, strength, tone, movements noted in Neurologic exam below  NEUROLOGIC: MENTAL STATUS:      No data to display         awake, alert, oriented to person, place and time recent and remote memory intact normal attention and concentration language fluent, comprehension intact, naming intact fund of knowledge appropriate  CRANIAL NERVE:  2nd, 3rd, 4th, 6th - Visual fields full to  confrontation, extraocular muscles intact, no nystagmus 5th - facial sensation symmetric 7th - facial strength symmetric 8th - hearing intact 9th - palate elevates symmetrically, uvula midline 11th - shoulder shrug symmetric 12th - tongue protrusion midline  MOTOR:  Decrease bulk in both hands, normal tone, full strength in the BUE, BLE  SENSORY:  Decrease to light touch, pinprick and vibration in the lower extremities up to knees and both hands. She also has abnormal proprioception.   COORDINATION:  finger-nose-finger, fine finger movements normal  GAIT/STATION:  Wide based     DIAGNOSTIC DATA (LABS, IMAGING, TESTING) - I reviewed patient records, labs, notes, testing and imaging myself where available.  Lab Results  Component Value Date   WBC 10.3 07/04/2023   HGB 11.6 (L) 07/04/2023   HCT 32.6 (L) 07/04/2023   MCV 85.8 07/04/2023   PLT 190 07/04/2023      Component Value Date/Time   NA 133 (L) 07/05/2023 0454   NA 132 (L) 09/04/2021 1456   K 3.7 07/05/2023 0454   CL 101 07/05/2023 0454   CO2 26 07/05/2023 0454   GLUCOSE 198 (H) 07/05/2023 0454   BUN 32 (H) 07/05/2023 0454   BUN 21 09/04/2021 1456   CREATININE 0.97 07/05/2023 0454   CREATININE 0.87 10/19/2022 1139   CALCIUM  8.0 (L) 07/05/2023 0454   PROT 5.5 (L) 07/04/2023 0527   PROT 6.8 01/11/2022 0916   ALBUMIN 2.5 (L) 07/04/2023 0527   ALBUMIN 3.9 01/11/2022 0916   AST 17 07/04/2023 0527   ALT 14 07/04/2023 0527   ALKPHOS 168 (H) 07/04/2023 0527   BILITOT 0.7 07/04/2023 0527   BILITOT 0.5 01/11/2022 0916   GFRNONAA >60 07/05/2023 0454   GFRAA >60 11/22/2015 0430   Lab Results  Component Value Date   CHOL 210 (H) 10/04/2022   HDL 88 10/04/2022   LDLCALC 108 (H) 10/04/2022   TRIG 49 10/04/2022   CHOLHDL 2.4 10/04/2022   Lab Results  Component Value Date   HGBA1C 8.7 (A) 01/16/2023   Lab Results  Component Value Date   VITAMINB12 703 10/04/2022   No results found for: TSH  MRI Brain  03/12/2023 Normal MRI of the brain. No explanation for headache.   MRV Head 03/12/2023 Motion degraded and truncated study that is negative for dural venous sinus thrombosis    ASSESSMENT AND PLAN  48 y.o. year old female with uncontrolled diabetes, recently admitted for DKA, hypertension, kidney disease who is presenting for left hand numbness. On exam, she has severe neuropathy up to the knees in the BLEs and hands. Discussed need for better diabetes control, and need to have close follow up with podiatry due to poor managed feet and toes.    1. Diabetic polyneuropathy associated with type 2 diabetes mellitus (HCC)      There are no Patient Instructions on file for this visit.  No orders of the defined types were placed in this encounter.   No orders of the defined types were placed in this encounter.   Return if symptoms worsen or fail to improve.    Pastor Falling, MD 07/17/2023, 7:52 AM  [facility] [address], [address] [place], [place] [address] [phone]

## 2023-07-17 ENCOUNTER — Encounter: Payer: Self-pay | Admitting: Neurology

## 2023-07-17 NOTE — Patient Instructions (Signed)
Strict blood glucose control  Follow up with PCP  Follow up with Podiatry  Return as needed

## 2023-07-18 ENCOUNTER — Other Ambulatory Visit: Payer: Self-pay | Admitting: Family Medicine

## 2023-07-18 NOTE — Telephone Encounter (Signed)
 Was this ever approved that you know of?

## 2023-07-19 NOTE — Telephone Encounter (Signed)
 I would urge her to reach out to her insurance company to inquire about why she isn't eligible.  She is on 4 injections of insulin  per day which should be more than enough to get her covered.

## 2023-07-25 ENCOUNTER — Encounter: Payer: Self-pay | Admitting: Nurse Practitioner

## 2023-07-25 ENCOUNTER — Ambulatory Visit: Payer: Medicaid Other | Admitting: Nurse Practitioner

## 2023-07-25 ENCOUNTER — Ambulatory Visit: Payer: Medicaid Other | Admitting: Nutrition

## 2023-07-25 VITALS — BP 110/70 | HR 96 | Ht 65.0 in | Wt 175.2 lb

## 2023-07-25 DIAGNOSIS — E782 Mixed hyperlipidemia: Secondary | ICD-10-CM | POA: Diagnosis not present

## 2023-07-25 DIAGNOSIS — Z794 Long term (current) use of insulin: Secondary | ICD-10-CM

## 2023-07-25 DIAGNOSIS — E11649 Type 2 diabetes mellitus with hypoglycemia without coma: Secondary | ICD-10-CM | POA: Diagnosis not present

## 2023-07-25 DIAGNOSIS — I10 Essential (primary) hypertension: Secondary | ICD-10-CM

## 2023-07-25 DIAGNOSIS — Z7985 Long-term (current) use of injectable non-insulin antidiabetic drugs: Secondary | ICD-10-CM

## 2023-07-25 LAB — POCT GLYCOSYLATED HEMOGLOBIN (HGB A1C): Hemoglobin A1C: 9.7 % — AB (ref 4.0–5.6)

## 2023-07-25 MED ORDER — NOVOLOG FLEXPEN 100 UNIT/ML ~~LOC~~ SOPN: 10.0000 [IU] | PEN_INJECTOR | Freq: Three times a day (TID) | SUBCUTANEOUS | 3 refills | Status: DC

## 2023-07-25 MED ORDER — INSULIN GLARGINE 100 UNITS/ML SOLOSTAR PEN: 35.0000 [IU] | PEN_INJECTOR | Freq: Every day | SUBCUTANEOUS | 3 refills | Status: DC

## 2023-07-25 NOTE — Progress Notes (Signed)
Endocrinology Follow Up Note       07/25/2023, 4:46 PM   Subjective:    Patient ID: Anne Carney, female    DOB: 08/08/1975.  Anne Carney is being seen in follow up after being seen in consultation for management of currently uncontrolled symptomatic diabetes requested by  [name], DO.   Past Medical History:  Diagnosis Date   Diabetes mellitus    Hypertension    Kidney disease    Neuropathy     Past Surgical History:  Procedure Laterality Date   BIOPSY  09/25/2021   Procedure: BIOPSY;  Surgeon: Lanelle Bal, DO;  [facility];  Service: Endoscopy;;   CESAREAN SECTION     COLONOSCOPY WITH PROPOFOL N/A 09/25/2021   Procedure: COLONOSCOPY WITH PROPOFOL;  Surgeon: Lanelle Bal, DO;  [facility];  Service: Endoscopy;  Laterality: N/A;  9:30am, ASA 3   COLONOSCOPY WITH PROPOFOL N/A 04/24/2022   Procedure: COLONOSCOPY WITH PROPOFOL;  Surgeon: Lanelle Bal, DO;  [facility];  Service: Endoscopy;  Laterality: N/A;  12:00pm, asa 2   ESOPHAGOGASTRODUODENOSCOPY (EGD) WITH PROPOFOL N/A 09/25/2021   Procedure: ESOPHAGOGASTRODUODENOSCOPY (EGD) WITH PROPOFOL;  Surgeon: Lanelle Bal, DO;  [facility];  Service: Endoscopy;  Laterality: N/A;   POLYPECTOMY  04/24/2022   Procedure: POLYPECTOMY INTESTINAL;  Surgeon: Lanelle Bal, DO;  [facility];  Service: Endoscopy;;   TOE SURGERY Right    TOE SURGERY Left     Social History   Socioeconomic History   Marital status: Divorced    Spouse name: Not on file   Number of children: Not on file   Years of education: Not on file   Highest education level: Not on file  Occupational History   Not on file  Tobacco Use   Smoking status: Former    Types: Cigarettes    Passive exposure: Past   Smokeless tobacco: Never  Vaping Use   Vaping status: Never Used  Substance and Sexual  Activity   Alcohol use: Yes    Comment: occ.   Drug use: Not Currently    Types: Marijuana    Comment: occasionally   Sexual activity: Yes    Birth control/protection: None  Other Topics Concern   Not on file  Social History Narrative   Not on file   Social Drivers of Health   Financial Resource Strain: Not on file  Food Insecurity: Patient Unable To Answer (07/03/2023)   Hunger Vital Sign    Worried About Running Out of Food in the Last Year: Patient unable to answer    Ran Out of Food in the Last Year: Patient unable to answer  Transportation Needs: Patient Unable To Answer (07/03/2023)   PRAPARE - Transportation    Lack of Transportation (Medical): Patient unable to answer    Lack of Transportation (Non-Medical): Patient unable to answer  Physical Activity: Not on file  Stress: Not on file  Social Connections: Not on file    Family History  Problem Relation Age of Onset   Asthma Mother    Huntington's disease Father    Asthma Daughter    Diabetes  Daughter    Asthma Son     Outpatient Encounter Medications as of 07/25/2023  Medication Sig   acetaminophen (TYLENOL) 325 MG tablet Take 2 tablets (650 mg total) by mouth every 6 (six) hours as needed for mild pain (pain score 1-3) (or Fever >/= 101).   amLODipine (NORVASC) 5 MG tablet Take 1 tablet by mouth once daily   blood glucose meter kit and supplies KIT Dispense based on patient and insurance preference. Use to check sugar up ro four times daily as directed.   cetirizine (ZYRTEC) 10 MG tablet Take 1 tablet (10 mg total) by mouth 2 (two) times daily.   famotidine (PEPCID) 20 MG tablet Take 1 tablet (20 mg total) by mouth 2 (two) times daily.   furosemide (LASIX) 20 MG tablet Take 20 mg by mouth daily as needed.   gabapentin (NEURONTIN) 100 MG capsule Take 2 capsules (200 mg total) by mouth 2 (two) times daily.   glucose blood (CONTOUR NEXT TEST) test strip Use as instructed to monitor glucose 4 times daily.   Insulin  Pen Needle 32G X 4 MM MISC Use up to 5 times daily to administer insulin.   Iron, Ferrous Sulfate, 325 (65 Fe) MG TABS Take 325 mg by mouth every other day.   lidocaine (XYLOCAINE) 2 % solution Use as directed 15 mLs in the mouth or throat every 6 (six) hours as needed for mouth pain (mouth and Throat pain).   meclizine (ANTIVERT) 25 MG tablet Take 1 tablet (25 mg total) by mouth 3 (three) times daily as needed for dizziness.   metoprolol tartrate (LOPRESSOR) 25 MG tablet Take 1 tablet (25 mg total) by mouth 2 (two) times daily.   nystatin (MYCOSTATIN) 100000 UNIT/ML suspension Take 5 mLs (500,000 Units total) by mouth 4 (four) times daily.   promethazine (PHENERGAN) 25 MG tablet Take 1 tablet (25 mg total) by mouth every 6 (six) hours as needed for nausea or vomiting.   triamcinolone cream (KENALOG) 0.1 % Apply 1 Application topically 2 (two) times daily as needed.   [DISCONTINUED] insulin aspart (NOVOLOG FLEXPEN) 100 UNIT/ML FlexPen Inject 14-20 Units into the skin 3 (three) times daily with meals.   [DISCONTINUED] insulin glargine (LANTUS) 100 unit/mL SOPN Inject 30 Units into the skin at bedtime. (Patient taking differently: Inject 50 Units into the skin at bedtime.)   Continuous Glucose Sensor (FREESTYLE LIBRE 3 PLUS SENSOR) MISC Change sensor every 15 days.   Continuous Glucose Sensor (FREESTYLE LIBRE 3 PLUS SENSOR) MISC Change sensor every 15 days.   insulin aspart (NOVOLOG FLEXPEN) 100 UNIT/ML FlexPen Inject 10-16 Units into the skin 3 (three) times daily with meals.   insulin glargine (LANTUS) 100 unit/mL SOPN Inject 35 Units into the skin at bedtime.   No facility-administered encounter medications on file as of 07/25/2023.    ALLERGIES: No Known Allergies  VACCINATION STATUS: Immunization History  Administered Date(s) Administered   Influenza, Seasonal, Injecte, Preservative Fre 03/25/2023   Influenza,inj,Quad PF,6+ Mos 04/02/2018   Influenza,trivalent, recombinat, inj, PF  04/14/2017   PPD Test 10/16/2022   Pneumococcal Polysaccharide-23 11/22/2015    Diabetes She presents for her follow-up diabetic visit. She has type 2 diabetes mellitus. Onset time: diagnosed at approx age of 48. Her disease course has been worsening. There are no hypoglycemic associated symptoms. Associated symptoms include fatigue and foot paresthesias. There are no hypoglycemic complications. Diabetic complications include PVD (hx partial toe amputation both feet). Risk factors for coronary artery disease include diabetes mellitus, dyslipidemia,  family history and hypertension. Current diabetic treatment includes intensive insulin program. She is compliant with treatment most of the time. Her weight is fluctuating minimally. She is following a generally unhealthy diet. When asked about meal planning, she reported none. She has not had a previous visit with a dietitian. She participates in exercise intermittently. Her home blood glucose trend is increasing steadily. Her overall blood glucose range is >200 mg/dl. (She presents today, accompanied by her daughter (also my patient), with her ,meter showing inconsistent glucose monitoring and gross hyperglycemia overall.  Insurance denied CGM coverage for unknown reasons. She recently had a stomach bug which sent her into DKA.  Her POCT A1c today is 9.7%, unchanged from previous visit.) An ACE inhibitor/angiotensin II receptor blocker is being taken. She does not see a podiatrist.Eye exam is current.     Review of systems  Constitutional: + Minimally fluctuating body weight,  current Body mass index is 29.15 kg/m. , no fatigue, no subjective hyperthermia, no subjective hypothermia Eyes: no blurry vision, no xerophthalmia ENT: no sore throat, no nodules palpated in throat, no dysphagia/odynophagia, no hoarseness Cardiovascular: no chest pain, no shortness of breath, no palpitations, + leg swelling Respiratory: no cough, no shortness of  breath Gastrointestinal: no nausea/vomiting/diarrhea Musculoskeletal: no muscle/joint aches Skin: no rashes, no hyperemia Neurological: no tremors, no numbness, no tingling, no dizziness Psychiatric: no depression, no anxiety  Objective:     BP 110/70 (BP Location: Right Arm, Patient Position: Sitting, Cuff Size: Large)   Pulse 96   Ht 5\' 5"  (1.651 m)   Wt 175 lb 3.2 oz (79.5 kg)   LMP 06/27/2023 Comment: start  BMI 29.15 kg/m   Wt Readings from Last 3 Encounters:  07/25/23 175 lb 3.2 oz (79.5 kg)  07/16/23 171 lb 8 oz (77.8 kg)  07/05/23 184 lb 11.9 oz (83.8 kg)     BP Readings from Last 3 Encounters:  07/25/23 110/70  07/16/23 (!) 146/83  07/05/23 (!) 156/77      Physical Exam- Limited  Constitutional:  Body mass index is 29.15 kg/m. , not in acute distress, normal state of mind Eyes:  EOMI, no exophthalmos Musculoskeletal: no gross deformities, strength intact in all four extremities, no gross restriction of joint movements Skin:  no rashes, no hyperemia Neurological: no tremor with outstretched hands   Diabetic Foot Exam - Simple   No data filed     CMP ( most recent) CMP     Component Value Date/Time   NA 133 (L) 07/05/2023 0454   NA 132 (L) 09/04/2021 1456   K 3.7 07/05/2023 0454   CL 101 07/05/2023 0454   CO2 26 07/05/2023 0454   GLUCOSE 198 (H) 07/05/2023 0454   BUN 32 (H) 07/05/2023 0454   BUN 21 09/04/2021 1456   CREATININE 0.97 07/05/2023 0454   CREATININE 0.87 10/19/2022 1139   CALCIUM 8.0 (L) 07/05/2023 0454   PROT 5.5 (L) 07/04/2023 0527   PROT 6.8 01/11/2022 0916   ALBUMIN 2.5 (L) 07/04/2023 0527   ALBUMIN 3.9 01/11/2022 0916   AST 17 07/04/2023 0527   ALT 14 07/04/2023 0527   ALKPHOS 168 (H) 07/04/2023 0527   BILITOT 0.7 07/04/2023 0527   BILITOT 0.5 01/11/2022 0916   EGFR 64 09/04/2021 1456   GFRNONAA >60 07/05/2023 0454     Diabetic Labs (most recent): Lab Results  Component Value Date   HGBA1C 9.7 (A) 07/25/2023   HGBA1C  8.7 (A) 01/16/2023   HGBA1C 13.1 (H) 10/04/2022  MICROALBUR 17.2 10/04/2022     Lipid Panel ( most recent) Lipid Panel     Component Value Date/Time   CHOL 210 (H) 10/04/2022 1530   TRIG 49 10/04/2022 1530   HDL 88 10/04/2022 1530   CHOLHDL 2.4 10/04/2022 1530   LDLCALC 108 (H) 10/04/2022 1530      No results found for: "TSH", "FREET4"         Assessment & Plan:   1) Type 2 diabetes mellitus with hypoglycemia without coma, with long-term current use of insulin (HCC)  She presents today, accompanied by her daughter (also my patient), with her ,meter showing inconsistent glucose monitoring and gross hyperglycemia overall.  Insurance denied CGM coverage for unknown reasons. She recently had a stomach bug which sent her into DKA.  Her POCT A1c today is 9.7%, unchanged from previous visit.  - Anne Carney has currently uncontrolled symptomatic type 2 DM since 48 years of age.   -Recent labs reviewed.  - I had a long discussion with her about the progressive nature of diabetes and the pathology behind its complications. -her diabetes is complicated by neuropathy, PVD- partial amputation of toe on bilateral feet and she remains at a high risk for more acute and chronic complications which include CAD, CVA, CKD, retinopathy, and neuropathy. These are all discussed in detail with her.  The following Lifestyle Medicine recommendations according to American College of Lifestyle Medicine [facility]) were discussed and offered to patient and she agrees to start the journey:  A. Whole Foods, Plant-based plate comprising of fruits and vegetables, plant-based proteins, whole-grain carbohydrates was discussed in detail with the patient.   A list for source of those nutrients were also provided to the patient.  Patient will use only water or unsweetened tea for hydration. B.  The need to stay away from risky substances including alcohol, smoking; obtaining 7 to 9 hours of restorative sleep, at  least 150 minutes of moderate intensity exercise weekly, the importance of healthy social connections,  and stress reduction techniques were discussed. C.  A full color page of  Calorie density of various food groups per pound showing examples of each food groups was provided to the patient.  - Nutritional counseling repeated at each appointment due to patients tendency to fall back in to old habits.  - The patient admits there is a room for improvement in their diet and drink choices. -  Suggestion is made for the patient to avoid simple carbohydrates from their diet including Cakes, Sweet Desserts / Pastries, Ice Cream, Soda (diet and regular), Sweet Tea, Candies, Chips, Cookies, Sweet Pastries, Store Bought Juices, Alcohol in Excess of 1-2 drinks a day, Artificial Sweeteners, Coffee Creamer, and "Sugar-free" Products. This will help patient to have stable blood glucose profile and potentially avoid unintended weight gain.   - I encouraged the patient to switch to unprocessed or minimally processed complex starch and increased protein intake (animal or plant source), fruits, and vegetables.   - Patient is advised to stick to a routine mealtimes to eat 3 meals a day and avoid unnecessary snacks (to snack only to correct hypoglycemia).  - I have approached her with the following individualized plan to manage her diabetes and patient agrees:   -She is advised to continue Lantus 35 units SQ nightly and continue Novolog to 10-16 units TID with meals if glucose is above 90 and she is eating (Specific instructions on how to titrate insulin dosage based on glucose readings given to patient in writing).   -  she is encouraged to continue monitoring glucose 4 times daily (using her CGM), before meals and before bed, and to call the clinic if she has readings less than 70 or above 300 for 3 tests in a row. I sent new Dexcom script to [facility].  - she is warned not to take insulin without proper monitoring per  orders. - Adjustment parameters are given to her for hypo and hyperglycemia in writing.  - I advised her not to start the Ozempic (says it required PA and PCP hasn't yet completed it).  Due to cost and BMI of near 25, she may not be the best candidate for this type of medication.  - Specific targets for  A1c; LDL, HDL, and Triglycerides were discussed with the patient.  2) Blood Pressure /Hypertension:  her blood pressure is not controlled to target today.   she is advised to continue her current medications including Lisinopril/HCT 10-12.5 mg p.o. daily with breakfast.  3) Lipids/Hyperlipidemia:    Review of her recent lipid panel from 10/04/22 showed uncontrolled LDL at 108 .  she is advised to continue Crestor 20 mg daily at bedtime.  Side effects and precautions discussed with her.  4)  Weight/Diet:  her Body mass index is 29.15 kg/m.  -  she is NOT a candidate for major weight loss. I discussed with her the fact that loss of 5 - 10% of her  current body weight will have the most impact on her diabetes management.  Exercise, and detailed carbohydrates information provided  -  detailed on discharge instructions.  5) Chronic Care/Health Maintenance: -she is on ACEI/ARB and Statin medications and is encouraged to initiate and continue to follow up with Ophthalmology, Dentist, Podiatrist at least yearly or according to recommendations, and advised to stay away from smoking. I have recommended yearly flu vaccine and pneumonia vaccine at least every 5 years; moderate intensity exercise for up to 150 minutes weekly; and sleep for at least 7 hours a day.  - she is advised to maintain close follow up with [name], DO for primary care needs, as well as her other providers for optimal and coordinated care.     I spent  40  minutes in the care of the patient today including review of labs from CMP, Lipids, Thyroid Function, Hematology (current and previous including abstractions from other  facilities); face-to-face time discussing  her blood glucose readings/logs, discussing hypoglycemia and hyperglycemia episodes and symptoms, medications doses, her options of short and long term treatment based on the latest standards of care / guidelines;  discussion about incorporating lifestyle medicine;  and documenting the encounter. Risk reduction counseling performed per USPSTF guidelines to reduce obesity and cardiovascular risk factors.     Please refer to Patient Instructions for Blood Glucose Monitoring and Insulin/Medications Dosing Guide"  in media tab for additional information. Please  also refer to " Patient Self Inventory" in the Media  tab for reviewed elements of pertinent patient history.  Anne Carney participated in the discussions, expressed understanding, and voiced agreement with the above plans.  All questions were answered to her satisfaction. she is encouraged to contact clinic should she have any questions or concerns prior to her return visit.     Follow up plan: - Return in about 3 months (around 10/22/2023) for Diabetes F/U with A1c in office, No previsit labs, Bring meter and logs.   Ronny Bacon, [facility] [address] [place], [place] [address] Phone: [phone] Fax:  [phone]  07/25/2023, 4:46 PM

## 2023-08-26 ENCOUNTER — Ambulatory Visit: Payer: Self-pay | Admitting: Family Medicine

## 2023-08-26 NOTE — Telephone Encounter (Signed)
 Chief Complaint: R leg swelling Symptoms: R leg swelling Frequency: 2 wks Pertinent Negatives: Patient denies fever, redness, signs of infection, knot under the skin, skin temperature change Disposition: [] ED /[x] Urgent Care (no appt availability in office) / [] Appointment(In office/virtual)/ []  [facility]/ [] Home Care/ [] Refused Recommended Disposition /[] [facility] [place] [facility]/ []  Follow-up with PCP Additional Notes: Pt reports R foot and leg swelling. Swelling is moderate up to the knee. Endorses pitting. Pt endorses 6/10 pain and 5/10 itching. States this has happened before and elevating it helped in the past. Pt has been elevating it this time as well with some improvement. Pt is prescribed daily Lasix PRN and states she has not taken it for a few months because she had no swelling. States she recently started taking it again d/t swelling. Pt states she has loss of sensation in that foot but always does d/t neuropathy. Denies redness. States some of the skin looks white but it may be "ashy skin." Denies skin that is cool or hot to the touch. Denies fever. States she has "liver issues" and has been seeing doctors for that. Pt states her abdomen may have been swollen recently. Pt endorses CP on that day. None today. Pt also endorses intermittent SOB with walking. None today or right now. No CP or SOB today. Denies knot under the skin.  RN advised pt she needs to be seen within 4 hours. No availability at [facility] in that timeframe. RN found a few afternoon appts available at [facility] for today but pt declined to travel that far and said she would prefer to go to UC. RN urged pt to go to UC within 4 hours. Pt states she will do that. RN also advised pt she needs to call 911 if she becomes SOB or her CP returns and she verbalized understanding.   Copied from CRM [id number]. Topic: Clinical - Red Word Triage >> Aug 26, 2023 12:54 PM Antwanette L wrote: Red Word that prompted transfer to  Nurse Triage: right foot swollen( few weeks) Reason for Disposition  [1] Thigh, calf, or ankle swelling AND [2] only 1 side  Answer Assessment - Initial Assessment Questions 1. ONSET: "When did the swelling start?" (e.g., minutes, hours, days)     2 wks 2. LOCATION: "What part of the leg is swollen?"  "Are both legs swollen or just one leg?"     R foot 3. SEVERITY: "How bad is the swelling?" (e.g., localized; mild, moderate, severe)   - Localized: Small area of swelling localized to one leg.   - MILD pedal edema: Swelling limited to foot and ankle, pitting edema < 1/4 inch (6 mm) deep, rest and elevation eliminate most or all swelling.   - MODERATE edema: Swelling of lower leg to knee, pitting edema > 1/4 inch (6 mm) deep, rest and elevation only partially reduce swelling.   - SEVERE edema: Swelling extends above knee, facial or hand swelling present.      Moderate - swollen to the knee 4. REDNESS: "Does the swelling look red or infected?"     No redness, does not look infected per pt 5. PAIN: "Is the swelling painful to touch?" If Yes, ask: "How painful is it?"   (Scale 1-10; mild, moderate or severe)     "A little" pain (6/10), but pt states it itches - 5/10 itching 6. FEVER: "Do you have a fever?" If Yes, ask: "What is it, how was it measured, and when did it start?"  No 7. CAUSE: "What do you think is causing the leg swelling?"     Not sure  8. MEDICAL HISTORY: "Do you have a history of blood clots (e.g., DVT), cancer, heart failure, kidney disease, or liver failure?"     HTN, Type 2 DM, "I have issues with my liver, seeing a doctor 4/8" 9. RECURRENT SYMPTOM: "Have you had leg swelling before?" If Yes, ask: "When was the last time?" "What happened that time?"     Yes  10. OTHER SYMPTOMS: "Do you have any other symptoms?" (e.g., chest pain, difficulty breathing)       "It do look white but I am ashy all the time." Denies that the skin is cold to the touch. Denies that skin is  warm/hot to the touch. States she usually does not have sensation or feeling in that foot d/t neuropathy (takes gabapentin), states the loss of sensation may be worse with the swelling. Endorses pitting. 6/10 pain, 5/10 itching. Still able to walk, but sometimes it hurts. Sock leaves a ring around the ankle. Wearing loose socks. Pt states she has been elevating her legs which helps a little. States she recently started taking Lasix again due to swelling. Said she hadn't taken one "for a couple of months." Appt with the foot doctor for next week. "Issues with my liver" - states her abdomen looked "different" and might have been swollen recently. Endorses CP on the day her abdomen was swollen. Denies CP right now. "Sometimes" have SOB, but not everyday, only with walking. No SOB today. Last week her L leg was a little swollen but it has gone. Denies a knot under the leg. No hx of DVT or blood thinners.  Protocols used: Leg Swelling and Edema-A-AH

## 2023-09-03 ENCOUNTER — Ambulatory Visit: Admitting: Podiatry

## 2023-09-16 ENCOUNTER — Ambulatory Visit: Payer: Self-pay | Admitting: Podiatry

## 2023-09-20 ENCOUNTER — Ambulatory Visit: Payer: Medicaid Other | Admitting: Allergy & Immunology

## 2023-10-10 ENCOUNTER — Ambulatory Visit (INDEPENDENT_AMBULATORY_CARE_PROVIDER_SITE_OTHER): Payer: Self-pay | Admitting: Family Medicine

## 2023-10-10 ENCOUNTER — Encounter: Payer: Self-pay | Admitting: Family Medicine

## 2023-10-10 VITALS — BP 170/82 | HR 57 | Wt 195.4 lb

## 2023-10-10 DIAGNOSIS — E785 Hyperlipidemia, unspecified: Secondary | ICD-10-CM

## 2023-10-10 DIAGNOSIS — R600 Localized edema: Secondary | ICD-10-CM

## 2023-10-10 DIAGNOSIS — I10 Essential (primary) hypertension: Secondary | ICD-10-CM

## 2023-10-10 MED ORDER — FUROSEMIDE 20 MG PO TABS: 20.0000 mg | ORAL_TABLET | Freq: Every day | ORAL | 1 refills | Status: AC

## 2023-10-10 MED ORDER — LOSARTAN POTASSIUM 50 MG PO TABS: 50.0000 mg | ORAL_TABLET | Freq: Every day | ORAL | 1 refills | Status: DC

## 2023-10-10 NOTE — Patient Instructions (Signed)
 Labs today.  Stop Amlodipine . Start Losartan .  Lasix  refilled.  Follow up in 2 weeks.

## 2023-10-11 LAB — CMP14+EGFR
ALT: 52 IU/L — ABNORMAL HIGH (ref 0–32)
AST: 75 IU/L — ABNORMAL HIGH (ref 0–40)
Albumin: 4.1 g/dL (ref 3.9–4.9)
Alkaline Phosphatase: 562 IU/L — ABNORMAL HIGH (ref 44–121)
BUN/Creatinine Ratio: 22 (ref 9–23)
BUN: 26 mg/dL — ABNORMAL HIGH (ref 6–24)
Bilirubin Total: 0.2 mg/dL (ref 0.0–1.2)
CO2: 26 mmol/L (ref 20–29)
Calcium: 9.4 mg/dL (ref 8.7–10.2)
Chloride: 101 mmol/L (ref 96–106)
Creatinine, Ser: 1.17 mg/dL — ABNORMAL HIGH (ref 0.57–1.00)
Globulin, Total: 2.8 g/dL (ref 1.5–4.5)
Glucose: 157 mg/dL — ABNORMAL HIGH (ref 70–99)
Potassium: 4.9 mmol/L (ref 3.5–5.2)
Sodium: 140 mmol/L (ref 134–144)
Total Protein: 6.9 g/dL (ref 6.0–8.5)
eGFR: 57 mL/min/{1.73_m2} — ABNORMAL LOW (ref >59)

## 2023-10-11 LAB — CBC
Hematocrit: 33.4 % — ABNORMAL LOW (ref 34.0–46.6)
Hemoglobin: 11.5 g/dL (ref 11.1–15.9)
MCH: 30.8 pg (ref 26.6–33.0)
MCHC: 34.4 g/dL (ref 31.5–35.7)
MCV: 90 fL (ref 79–97)
Platelets: 188 10*3/uL (ref 150–450)
RBC: 3.73 x10E6/uL — ABNORMAL LOW (ref 3.77–5.28)
RDW: 12.5 % (ref 11.7–15.4)
WBC: 7.4 10*3/uL (ref 3.4–10.8)

## 2023-10-11 LAB — LIPID PANEL
Chol/HDL Ratio: 2.1 ratio (ref 0.0–4.4)
Cholesterol, Total: 234 mg/dL — ABNORMAL HIGH (ref 100–199)
HDL: 110 mg/dL (ref >39)
LDL Chol Calc (NIH): 117 mg/dL — ABNORMAL HIGH (ref 0–99)
Triglycerides: 43 mg/dL (ref 0–149)
VLDL Cholesterol Cal: 7 mg/dL (ref 5–40)

## 2023-10-11 LAB — MICROALBUMIN / CREATININE URINE RATIO
Creatinine, Urine: 83.1 mg/dL
Microalb/Creat Ratio: 568 mg/g{creat} — ABNORMAL HIGH (ref 0–29)
Microalbumin, Urine: 471.6 ug/mL

## 2023-10-11 LAB — BRAIN NATRIURETIC PEPTIDE: BNP: 44.4 pg/mL (ref 0.0–100.0)

## 2023-10-13 ENCOUNTER — Encounter: Payer: Self-pay | Admitting: Family Medicine

## 2023-10-14 ENCOUNTER — Ambulatory Visit (INDEPENDENT_AMBULATORY_CARE_PROVIDER_SITE_OTHER): Payer: Self-pay | Admitting: Podiatry

## 2023-10-14 ENCOUNTER — Encounter: Payer: Self-pay | Admitting: Podiatry

## 2023-10-14 VITALS — Ht 65.0 in | Wt 195.4 lb

## 2023-10-14 DIAGNOSIS — M79675 Pain in left toe(s): Secondary | ICD-10-CM

## 2023-10-14 DIAGNOSIS — B351 Tinea unguium: Secondary | ICD-10-CM | POA: Diagnosis not present

## 2023-10-14 DIAGNOSIS — M79674 Pain in right toe(s): Secondary | ICD-10-CM

## 2023-10-14 NOTE — Assessment & Plan Note (Addendum)
 Uncontrolled.  Stopping amlodipine  due to lower extremity edema.  Starting losartan .  Lasix  as directed.  Labs today.

## 2023-10-14 NOTE — Progress Notes (Signed)
 Subjective:  Patient ID: Anne Carney, female    DOB: 12/14/1975  Age: 48 y.o. MRN: [id number]  CC: Lower extremity swelling, request for Lasix    HPI:  48 year old female with uncontrolled type 2 diabetes presents for evaluation of the above.  Patient reports ongoing lower extremity edema.  Worsening.  She is on amlodipine .  She is requesting refill on Lasix .  Patient reports some shortness of breath.  She states that she does not lie flat when she sleeps.  She states that she feels like she cannot breathe when she does this.  No PND.  Hypertension uncontrolled.  Will discuss this today.  Patient Active Problem List   Diagnosis Date Noted   Acute nonintractable headache 03/25/2023   IDA (iron  deficiency anemia) 03/19/2023   History of partial amputation of toe of left foot (HCC) 12/28/2022   Proteinuria 10/05/2022   Hyperlipidemia 10/05/2022   Elevated alkaline phosphatase level 01/26/2022   Elevated LFTs 11/28/2021   Gastroesophageal reflux disease    Primary hypertension    DM type 2 (diabetes mellitus, type 2) (HCC) 11/21/2015    Social Hx   Social History   Socioeconomic History   Marital status: Divorced    Spouse name: Not on file   Number of children: Not on file   Years of education: Not on file   Highest education level: Not on file  Occupational History   Not on file  Tobacco Use   Smoking status: Former    Types: Cigarettes    Passive exposure: Past   Smokeless tobacco: Never  Vaping Use   Vaping status: Never Used  Substance and Sexual Activity   Alcohol use: Yes    Comment: occ.   Drug use: Not Currently    Types: Marijuana    Comment: occasionally   Sexual activity: Yes    Birth control/protection: None  Other Topics Concern   Not on file  Social History Narrative   Not on file   Social Drivers of Health   Financial Resource Strain: Not on file  Food Insecurity: Patient Unable To Answer (07/03/2023)   Hunger Vital Sign    Worried  About Running Out of Food in the Last Year: Patient unable to answer    Ran Out of Food in the Last Year: Patient unable to answer  Transportation Needs: Patient Unable To Answer (07/03/2023)   PRAPARE - Administrator, Civil Service (Medical): Patient unable to answer    Lack of Transportation (Non-Medical): Patient unable to answer  Physical Activity: Not on file  Stress: Not on file  Social Connections: Not on file    Review of Systems Per HPI  Objective:  BP (!) 170/82   Pulse (!) 57   Wt 195 lb 6.4 oz (88.6 kg)   SpO2 (!) 76%   BMI 32.52 kg/m      10/10/2023    2:45 PM 07/25/2023    3:37 PM 07/16/2023   12:55 PM  BP/Weight  Systolic BP 170 110 146  Diastolic BP 82 70 83  Wt. (Lbs) 195.4 175.2 171.5  BMI 32.52 kg/m2 29.15 kg/m2 28.54 kg/m2    Physical Exam Constitutional:      General: She is not in acute distress.    Appearance: Normal appearance.  HENT:     Head: Normocephalic and atraumatic.  Eyes:     General:        Right eye: No discharge.        Left eye: No  discharge.     Conjunctiva/sclera: Conjunctivae normal.  Pulmonary:     Effort: Pulmonary effort is normal. No respiratory distress.  Musculoskeletal:     Comments: Significant lower extremity edema noted.  Neurological:     Mental Status: She is alert.     Lab Results  Component Value Date   WBC 7.4 10/10/2023   HGB 11.5 10/10/2023   HCT 33.4 (L) 10/10/2023   PLT 188 10/10/2023   GLUCOSE 157 (H) 10/10/2023   CHOL 234 (H) 10/10/2023   TRIG 43 10/10/2023   HDL 110 10/10/2023   LDLCALC 117 (H) 10/10/2023   ALT 52 (H) 10/10/2023   AST 75 (H) 10/10/2023   NA 140 10/10/2023   K 4.9 10/10/2023   CL 101 10/10/2023   CREATININE 1.17 (H) 10/10/2023   BUN 26 (H) 10/10/2023   CO2 26 10/10/2023   INR 1.0 06/06/2023   HGBA1C 9.7 (A) 07/25/2023   MICROALBUR 17.2 10/04/2022     Assessment & Plan:  Primary hypertension Assessment & Plan: Uncontrolled.  Stopping amlodipine  due to  lower extremity edema.  Starting losartan .  Lasix  as directed.  Labs today.  Orders: -     CMP14+EGFR -     Microalbumin / creatinine urine ratio  Lower extremity edema -     CBC -     CMP14+EGFR -     Microalbumin / creatinine urine ratio -     Brain natriuretic peptide  Hyperlipidemia, unspecified hyperlipidemia type -     Lipid panel  Other orders -     Losartan  Potassium; Take 1 tablet (50 mg total) by mouth daily.  Dispense: 90 tablet; Refill: 1 -     Furosemide ; Take 1 tablet (20 mg total) by mouth daily.  Dispense: 90 tablet; Refill: 1    Follow-up: 2 weeks  Neal Oshea Debrah Fan DO [facility]

## 2023-10-14 NOTE — Progress Notes (Signed)
  Subjective:  Patient ID: Anne Carney, female    DOB: May 27, 1976,  MRN: [id number]  Chief Complaint  Patient presents with   Nail Problem    Pt is here for [facility].    48 y.o. female presents with the above complaint. History confirmed with patient.  Nails are thickened and elongated.  Blood sugar is up some.  Objective:  Physical Exam: warm, good capillary refill, no trophic changes or ulcerative lesions, normal DP and PT pulses, and reduced sensation at tips of toes. Left Foot: dystrophic yellowed discolored nail plates with subungual debris Right Foot: dystrophic yellowed discolored nail plates with subungual debris   Assessment:   1. Pain due to onychomycosis of toenails of both feet      Plan:  Patient was evaluated and treated and all questions answered.  Discussed the etiology and treatment options for the condition in detail with the patient. Recommended debridement of the nails today. Sharp and mechanical debridement performed of all painful and mycotic nails today. Nails debrided in length and thickness using a nail nipper to level of comfort. Discussed treatment options including appropriate shoe gear. Follow up as needed for painful nails.    Return if symptoms worsen or fail to improve.

## 2023-10-15 ENCOUNTER — Telehealth: Payer: Self-pay | Admitting: Gastroenterology

## 2023-10-15 ENCOUNTER — Other Ambulatory Visit: Payer: Self-pay | Admitting: Family Medicine

## 2023-10-15 DIAGNOSIS — N1831 Chronic kidney disease, stage 3a: Secondary | ICD-10-CM

## 2023-10-15 MED ORDER — ROSUVASTATIN CALCIUM 10 MG PO TABS: 10.0000 mg | ORAL_TABLET | Freq: Every day | ORAL | 3 refills | Status: DC

## 2023-10-15 NOTE — Telephone Encounter (Signed)
 PCP reached out regarding rising alk phos. Unfortunately she cancelled her appt with [facility] last month. Possibly insurance issues per note in EPIC.  She needs ov with Dr. Mordechai April. I have completed extensive work up of liver and planned referral to hepatologist given unclear etiology of her liver disease. Need her to see Dr. Mordechai April for input as it will be several months before we can get her back in to a hepatologist.

## 2023-10-16 NOTE — Telephone Encounter (Signed)
 Anne Carney, looks like you made an appt with me. She really needs to see Dr. Mordechai April since she is not able to go to hepatologist in [place].   She can keep appt with me if she desires, BUT she will have to come back and see him in June. I really need her to see him.

## 2023-10-21 ENCOUNTER — Ambulatory Visit: Payer: Self-pay | Admitting: Gastroenterology

## 2023-10-22 ENCOUNTER — Ambulatory Visit: Payer: Medicaid Other | Admitting: Nurse Practitioner

## 2023-10-22 DIAGNOSIS — I10 Essential (primary) hypertension: Secondary | ICD-10-CM

## 2023-10-22 DIAGNOSIS — Z794 Long term (current) use of insulin: Secondary | ICD-10-CM

## 2023-10-22 DIAGNOSIS — E782 Mixed hyperlipidemia: Secondary | ICD-10-CM

## 2023-10-22 DIAGNOSIS — Z7985 Long-term (current) use of injectable non-insulin antidiabetic drugs: Secondary | ICD-10-CM

## 2023-10-25 ENCOUNTER — Ambulatory Visit: Admitting: Family Medicine

## 2023-10-25 ENCOUNTER — Encounter: Payer: Self-pay | Admitting: Family Medicine

## 2023-10-25 VITALS — BP 137/83 | HR 92 | Temp 98.2°F | Ht 65.0 in | Wt 192.0 lb

## 2023-10-25 DIAGNOSIS — I10 Essential (primary) hypertension: Secondary | ICD-10-CM | POA: Diagnosis not present

## 2023-10-25 DIAGNOSIS — R748 Abnormal levels of other serum enzymes: Secondary | ICD-10-CM | POA: Diagnosis not present

## 2023-10-25 DIAGNOSIS — R809 Proteinuria, unspecified: Secondary | ICD-10-CM

## 2023-10-25 DIAGNOSIS — E1129 Type 2 diabetes mellitus with other diabetic kidney complication: Secondary | ICD-10-CM | POA: Diagnosis not present

## 2023-10-25 DIAGNOSIS — N1831 Chronic kidney disease, stage 3a: Secondary | ICD-10-CM

## 2023-10-25 DIAGNOSIS — Z794 Long term (current) use of insulin: Secondary | ICD-10-CM

## 2023-10-25 NOTE — Patient Instructions (Signed)
 Follow up in 3 months.  I am reaching out to Dr. Mordechai April.

## 2023-10-28 ENCOUNTER — Other Ambulatory Visit: Payer: Self-pay | Admitting: Family Medicine

## 2023-10-28 DIAGNOSIS — N183 Chronic kidney disease, stage 3 unspecified: Secondary | ICD-10-CM | POA: Insufficient documentation

## 2023-10-28 MED ORDER — METOPROLOL TARTRATE 25 MG PO TABS: 25.0000 mg | ORAL_TABLET | Freq: Two times a day (BID) | ORAL | 3 refills | Status: DC

## 2023-10-28 NOTE — Assessment & Plan Note (Signed)
 Stable.  Continue current medications.

## 2023-10-28 NOTE — Progress Notes (Signed)
 Subjective:  Patient ID: Anne Carney, female    DOB: 11-30-1975  Age: 48 y.o. MRN: [id number]  CC:   Chief Complaint  Patient presents with   Follow-up    2 week f/u     HPI:  48 year old female presents for 2-week follow-up.  BP stable.  Patient compliant with losartan , Lasix , and metoprolol .  Patient's alkaline phosphatase is rising.  Now up to 562.  Etiology unclear.  Has been seen by GI.  Has had biopsy which was unrevealing.  Was supposed to see hepatology but there was an insurance issue.  Type 2 diabetes remains uncontrolled.  Follows with endocrinology.   Patient Active Problem List   Diagnosis Date Noted   CKD (chronic kidney disease) stage 3, GFR 30-59 ml/min (HCC) 10/28/2023   IDA (iron  deficiency anemia) 03/19/2023   History of partial amputation of toe of left foot (HCC) 12/28/2022   Proteinuria 10/05/2022   Hyperlipidemia 10/05/2022   Elevated alkaline phosphatase level 01/26/2022   Elevated LFTs 11/28/2021   Gastroesophageal reflux disease    Primary hypertension    DM type 2 (diabetes mellitus, type 2) (HCC) 11/21/2015    Social Hx   Social History   Socioeconomic History   Marital status: Divorced    Spouse name: Not on file   Number of children: Not on file   Years of education: Not on file   Highest education level: Not on file  Occupational History   Not on file  Tobacco Use   Smoking status: Former    Types: Cigarettes    Passive exposure: Past   Smokeless tobacco: Never  Vaping Use   Vaping status: Never Used  Substance and Sexual Activity   Alcohol use: Yes    Comment: occ.   Drug use: Not Currently    Types: Marijuana    Comment: occasionally   Sexual activity: Yes    Birth control/protection: None  Other Topics Concern   Not on file  Social History Narrative   Not on file   Social Drivers of Health   Financial Resource Strain: Not on file  Food Insecurity: Patient Unable To Answer (07/03/2023)   Hunger Vital Sign     Worried About Running Out of Food in the Last Year: Patient unable to answer    Ran Out of Food in the Last Year: Patient unable to answer  Transportation Needs: Patient Unable To Answer (07/03/2023)   PRAPARE - Administrator, Civil Service (Medical): Patient unable to answer    Lack of Transportation (Non-Medical): Patient unable to answer  Physical Activity: Not on file  Stress: Not on file  Social Connections: Not on file    Review of Systems Per HPI  Objective:  BP 137/83   Pulse 92   Temp 98.2 F (36.8 C)   Ht 5\' 5"  (1.651 m)   Wt 192 lb (87.1 kg)   SpO2 100%   BMI 31.95 kg/m      10/25/2023    9:39 AM 10/25/2023    9:18 AM 10/14/2023   10:39 AM  BP/Weight  Systolic BP 137 162   Diastolic BP 83 88   Wt. (Lbs)  192 195.4  BMI  31.95 kg/m2 32.52 kg/m2    Physical Exam Vitals and nursing note reviewed.  Constitutional:      General: Anne Carney is not in acute distress.    Appearance: Normal appearance.  HENT:     Head: Normocephalic and atraumatic.  Cardiovascular:  Rate and Rhythm: Normal rate and regular rhythm.  Pulmonary:     Effort: Pulmonary effort is normal.     Breath sounds: Normal breath sounds.  Neurological:     Mental Status: Anne Carney is alert.  Psychiatric:        Mood and Affect: Mood normal.        Behavior: Behavior normal.     Lab Results  Component Value Date   WBC 7.4 10/10/2023   HGB 11.5 10/10/2023   HCT 33.4 (L) 10/10/2023   PLT 188 10/10/2023   GLUCOSE 157 (H) 10/10/2023   CHOL 234 (H) 10/10/2023   TRIG 43 10/10/2023   HDL 110 10/10/2023   LDLCALC 117 (H) 10/10/2023   ALT 52 (H) 10/10/2023   AST 75 (H) 10/10/2023   NA 140 10/10/2023   K 4.9 10/10/2023   CL 101 10/10/2023   CREATININE 1.17 (H) 10/10/2023   BUN 26 (H) 10/10/2023   CO2 26 10/10/2023   INR 1.0 06/06/2023   HGBA1C 9.7 (A) 07/25/2023   MICROALBUR 17.2 10/04/2022     Assessment & Plan:  Primary hypertension Assessment & Plan: Stable.  Continue  current medications.   Stage 3a chronic kidney disease (HCC)  Type 2 diabetes mellitus with diabetic microalbuminuria, with long-term current use of insulin  (HCC) Assessment & Plan: Uncontrolled. Follows with Endo.  Likely due to noncompliance.   Elevated alkaline phosphatase level Assessment & Plan: Spoke with GI. Anne Carney is getting back in with them and will get referred again to hepatology.   Other orders -     Metoprolol  Tartrate; Take 1 tablet (25 mg total) by mouth 2 (two) times daily.  Dispense: 90 tablet; Refill: 3   Follow-up:  3 months  Eilidh Marcano Debrah Fan DO [facility]

## 2023-10-28 NOTE — Assessment & Plan Note (Signed)
 Spoke with GI. She is getting back in with them and will get referred again to hepatology.

## 2023-10-28 NOTE — Assessment & Plan Note (Signed)
 Uncontrolled. Follows with Endo.  Likely due to noncompliance.

## 2023-10-29 ENCOUNTER — Other Ambulatory Visit: Payer: Self-pay

## 2023-10-30 ENCOUNTER — Encounter: Payer: Self-pay | Admitting: Emergency Medicine

## 2023-10-30 ENCOUNTER — Ambulatory Visit
Admission: EM | Admit: 2023-10-30 | Discharge: 2023-10-30 | Disposition: A | Discharge status: Home / Self Care | Attending: Nurse Practitioner | Admitting: Nurse Practitioner

## 2023-10-30 DIAGNOSIS — R109 Unspecified abdominal pain: Secondary | ICD-10-CM | POA: Diagnosis present

## 2023-10-30 DIAGNOSIS — R748 Abnormal levels of other serum enzymes: Secondary | ICD-10-CM | POA: Diagnosis present

## 2023-10-30 LAB — POCT URINALYSIS DIP (MANUAL ENTRY)
Bilirubin, UA: NEGATIVE
Blood, UA: NEGATIVE
Glucose, UA: NEGATIVE mg/dL
Ketones, POC UA: NEGATIVE mg/dL
Nitrite, UA: NEGATIVE
Protein Ur, POC: 30 mg/dL — AB
Spec Grav, UA: 1.02 (ref 1.010–1.025)
Urobilinogen, UA: 1 U/dL
pH, UA: 5.5 (ref 5.0–8.0)

## 2023-10-30 NOTE — ED Provider Notes (Signed)
 [facility]    CSN: [id number] Arrival date & time: 10/30/23  1858      History   Chief Complaint No chief complaint on file.   HPI Anne Carney is a 48 y.o. female.   The history is provided by the patient.   Patient presents for complaints of right-sided abdominal pain is been present for the past month.  Patient denies fever, chills, nausea, vomiting, diarrhea, or rash.  Patient states that her last bowel movement was today, states that she has a bowel movement daily.  Patient reports that she has seen her PCP within the past month.  Lab work was performed which shows elevated alkaline phosphatase and elevated liver function results.  Patient states she is waiting to see a specialist at [facility] for her liver.  Patient further denies urinary frequency, urgency, hematuria, gas, bloating, bloody stool, or melena stools.  States she has not taken any medication for her symptoms.  States that she did see her PCP and mentioned her abdominal pain, but he did not do anything about it.  Past Medical History:  Diagnosis Date   Diabetes mellitus    Hypertension    Kidney disease    Neuropathy     Patient Active Problem List   Diagnosis Date Noted   CKD (chronic kidney disease) stage 3, GFR 30-59 ml/min (HCC) 10/28/2023   IDA (iron  deficiency anemia) 03/19/2023   History of partial amputation of toe of left foot (HCC) 12/28/2022   Proteinuria 10/05/2022   Hyperlipidemia 10/05/2022   Elevated alkaline phosphatase level 01/26/2022   Elevated LFTs 11/28/2021   Gastroesophageal reflux disease    Primary hypertension    DM type 2 (diabetes mellitus, type 2) (HCC) 11/21/2015    Past Surgical History:  Procedure Laterality Date   BIOPSY  09/25/2021   Procedure: BIOPSY;  Surgeon: Vinetta Greening, DO;  Location: [facility];  Service: Endoscopy;;   CESAREAN SECTION     COLONOSCOPY WITH PROPOFOL  N/A 09/25/2021   Procedure: COLONOSCOPY WITH PROPOFOL ;  Surgeon: Vinetta Greening, DO;  Location: [facility];  Service: Endoscopy;  Laterality: N/A;  9:30am, ASA 3   COLONOSCOPY WITH PROPOFOL  N/A 04/24/2022   Procedure: COLONOSCOPY WITH PROPOFOL ;  Surgeon: Vinetta Greening, DO;  Location: [facility];  Service: Endoscopy;  Laterality: N/A;  12:00pm, asa 2   ESOPHAGOGASTRODUODENOSCOPY (EGD) WITH PROPOFOL  N/A 09/25/2021   Procedure: ESOPHAGOGASTRODUODENOSCOPY (EGD) WITH PROPOFOL ;  Surgeon: Vinetta Greening, DO;  Location: [facility];  Service: Endoscopy;  Laterality: N/A;   POLYPECTOMY  04/24/2022   Procedure: POLYPECTOMY INTESTINAL;  Surgeon: Vinetta Greening, DO;  Location: [facility];  Service: Endoscopy;;   TOE SURGERY Right    TOE SURGERY Left     OB History   No obstetric history on file.      Home Medications    Prior to Admission medications   Medication Sig Start Date End Date Taking? Authorizing Provider  acetaminophen  (TYLENOL ) 325 MG tablet Take 2 tablets (650 mg total) by mouth every 6 (six) hours as needed for mild pain (pain score 1-3) (or Fever >/= 101). 07/04/23   Emokpae, Courage, MD  blood glucose meter kit and supplies KIT Dispense based on patient and insurance preference. Use to check sugar up ro four times daily as directed. 10/24/20   Justina Oman, MD  furosemide  (LASIX ) 20 MG tablet Take 1 tablet (20 mg total) by mouth daily. 10/10/23   Cook, Jayce G, DO  gabapentin  (NEURONTIN ) 100 MG capsule Take 2 capsules (200 mg total) by mouth 2 (two) times daily. 05/26/23   Cook, Jayce G, DO  glucose blood (CONTOUR NEXT TEST) test strip Use as instructed to monitor glucose 4 times daily. 04/03/23   Wendel Hals, NP  insulin  aspart (NOVOLOG  FLEXPEN) 100 UNIT/ML FlexPen Inject 10-16 Units into the skin 3 (three) times daily with meals. 07/25/23   Wendel Hals, NP  insulin  glargine (LANTUS ) 100 unit/mL SOPN Inject 35 Units into the skin at bedtime. 07/25/23   Wendel Hals, NP  Insulin  Pen Needle 32G X 4 MM MISC Use up to 5  times daily to administer insulin . 11/27/22   Cook, Jayce G, DO  Iron , Ferrous Sulfate , 325 (65 Fe) MG TABS Take 325 mg by mouth every other day. 07/04/23   Colin Dawley, MD  lidocaine  (XYLOCAINE ) 2 % solution Use as directed 15 mLs in the mouth or throat every 6 (six) hours as needed for mouth pain (mouth and Throat pain). 07/04/23   Colin Dawley, MD  losartan  (COZAAR ) 50 MG tablet Take 1 tablet (50 mg total) by mouth daily. 10/10/23   Cook, Jayce G, DO  meclizine  (ANTIVERT ) 25 MG tablet Take 1 tablet (25 mg total) by mouth 3 (three) times daily as needed for dizziness. 07/04/23   Colin Dawley, MD  metoprolol  tartrate (LOPRESSOR ) 25 MG tablet Take 1 tablet (25 mg total) by mouth 2 (two) times daily. 10/28/23 10/27/24  Cook, Jayce G, DO  rosuvastatin  (CRESTOR ) 10 MG tablet Take 1 tablet (10 mg total) by mouth daily. 10/15/23   Cook, Jayce G, DO    Family History Family History  Problem Relation Age of Onset   Asthma Mother    Huntington's disease Father    Asthma Daughter    Diabetes Daughter    Asthma Son     Social History Social History   Tobacco Use   Smoking status: Former    Types: Cigarettes    Passive exposure: Past   Smokeless tobacco: Never  Vaping Use   Vaping status: Never Used  Substance Use Topics   Alcohol use: Yes    Comment: occ.   Drug use: Not Currently    Types: Marijuana    Comment: occasionally     Allergies   Patient has no known allergies.   Review of Systems Review of Systems Per HPI  Physical Exam Triage Vital Signs ED Triage Vitals  Encounter Vitals Group     BP 10/30/23 1901 (!) 173/82     Systolic BP Percentile --      Diastolic BP Percentile --      Pulse Rate 10/30/23 1901 97     Resp 10/30/23 1901 18     Temp 10/30/23 1901 98.3 F (36.8 C)     Temp Source 10/30/23 1901 Oral     SpO2 10/30/23 1901 97 %     Weight --      Height --      Head Circumference --      Peak Flow --      Pain Score 10/30/23 1902 5     Pain Loc  --      Pain Education --      Exclude from Growth Chart --    No data found.  Updated Vital Signs BP (!) 173/82 (BP Location: Right Arm)   Pulse 97   Temp 98.3 F (36.8 C) (Oral)   Resp 18   SpO2 97%  Visual Acuity Right Eye Distance:   Left Eye Distance:   Bilateral Distance:    Right Eye Near:   Left Eye Near:    Bilateral Near:     Physical Exam Vitals and nursing note reviewed.  Constitutional:      General: She is not in acute distress.    Appearance: Normal appearance.  HENT:     Head: Normocephalic.  Eyes:     Extraocular Movements: Extraocular movements intact.     Pupils: Pupils are equal, round, and reactive to light.  Cardiovascular:     Rate and Rhythm: Normal rate and regular rhythm.     Pulses: Normal pulses.     Heart sounds: Normal heart sounds.  Pulmonary:     Effort: Pulmonary effort is normal. No respiratory distress.     Breath sounds: Normal breath sounds. No stridor. No wheezing, rhonchi or rales.  Abdominal:     General: Bowel sounds are normal.     Palpations: Abdomen is soft.     Tenderness: There is generalized abdominal tenderness. There is no right CVA tenderness, left CVA tenderness or guarding.  Musculoskeletal:     Cervical back: Normal range of motion.  Skin:    General: Skin is warm and dry.  Neurological:     General: No focal deficit present.     Mental Status: She is alert and oriented to person, place, and time.  Psychiatric:        Mood and Affect: Mood normal.        Behavior: Behavior normal.      UC Treatments / Results  Labs (all labs ordered are listed, but only abnormal results are displayed) Labs Reviewed  POCT URINALYSIS DIP (MANUAL ENTRY) - Abnormal; Notable for the following components:      Result Value   Protein Ur, POC =30 (*)    Leukocytes, UA Small (1+) (*)    All other components within normal limits  URINE CULTURE    EKG   Radiology No results found.  Procedures Procedures (including  critical care time)  Medications Ordered in UC Medications - No data to display  Initial Impression / Assessment and Plan / UC Course  I have reviewed the triage vital signs and the nursing notes.  Pertinent labs & imaging results that were available during my care of the patient were reviewed by me and considered in my medical decision making (see chart for details).  Difficult to determine the cause of the patient's current symptoms given her extensive history.  Per review of her chart, most recent liver function test and alk phos were elevated.  Patient is awaiting appointment with specialist at [facility] for evaluation of her liver.  Patient was advised to contact their office to see if she can be seen an earlier date and time.  Supportive care recommendations were provided and discussed with the patient to include fluids, rest, avoiding Tylenol  or ibuprofen , and eating a healthy diet.  Patient was given strict ER follow-up precautions.  Patient was in agreement with this plan of care and verbalizes understanding.  All questions were answered.  Patient stable for discharge.   Final Clinical Impressions(s) / UC Diagnoses   Final diagnoses:  Abdominal pain, unspecified abdominal location  Elevated alkaline phosphatase level  Elevated liver enzymes     Discharge Instructions      A urine culture is pending.  You will be contacted if the pending test results are abnormal.  You will also have access  to your results via MyChart. Keep all of your appointments as scheduled.  As discussed, please follow-up with the liver specialist (hepatologist) for further evaluation of your lab work. Avoid Tylenol  or ibuprofen  as these may be harmful to both your liver and your kidneys. Apply warm compresses to the affected area to help with pain or discomfort. Make sure you are eating a healthy diet to include plenty of fruits and vegetables to prevent constipation. Make sure you are drinking at least 8-10  8 ounce glasses of water daily. Go to the emergency department immediately if you experience worsening abdominal pain, nausea, vomiting, or other concerns. Follow-up as needed.   ED Prescriptions   None    PDMP not reviewed this encounter.   Hardy Lia, NP 10/30/23 [phone]

## 2023-10-30 NOTE — Discharge Instructions (Addendum)
 A urine culture is pending.  You will be contacted if the pending test results are abnormal.  You will also have access to your results via MyChart. Keep all of your appointments as scheduled.  As discussed, please follow-up with the liver specialist (hepatologist) for further evaluation of your lab work. Avoid Tylenol  or ibuprofen  as these may be harmful to both your liver and your kidneys. Apply warm compresses to the affected area to help with pain or discomfort. Make sure you are eating a healthy diet to include plenty of fruits and vegetables to prevent constipation. Make sure you are drinking at least 8-10 8 ounce glasses of water daily. Go to the emergency department immediately if you experience worsening abdominal pain, nausea, vomiting, or other concerns. Follow-up as needed.

## 2023-10-30 NOTE — ED Triage Notes (Signed)
 Right sided ABD pain x 1 month.

## 2023-10-31 ENCOUNTER — Ambulatory Visit: Admitting: Nurse Practitioner

## 2023-10-31 ENCOUNTER — Encounter: Payer: Self-pay | Admitting: Nurse Practitioner

## 2023-10-31 VITALS — BP 128/80 | HR 95 | Ht 65.0 in | Wt 197.2 lb

## 2023-10-31 DIAGNOSIS — Z794 Long term (current) use of insulin: Secondary | ICD-10-CM

## 2023-10-31 DIAGNOSIS — E782 Mixed hyperlipidemia: Secondary | ICD-10-CM

## 2023-10-31 DIAGNOSIS — Z7985 Long-term (current) use of injectable non-insulin antidiabetic drugs: Secondary | ICD-10-CM | POA: Diagnosis not present

## 2023-10-31 DIAGNOSIS — E11649 Type 2 diabetes mellitus with hypoglycemia without coma: Secondary | ICD-10-CM | POA: Diagnosis not present

## 2023-10-31 DIAGNOSIS — I10 Essential (primary) hypertension: Secondary | ICD-10-CM | POA: Diagnosis not present

## 2023-10-31 LAB — POCT GLYCOSYLATED HEMOGLOBIN (HGB A1C): Hemoglobin A1C: 8.5 % — AB (ref 4.0–5.6)

## 2023-10-31 MED ORDER — NOVOLOG FLEXPEN 100 UNIT/ML ~~LOC~~ SOPN: 10.0000 [IU] | PEN_INJECTOR | Freq: Three times a day (TID) | SUBCUTANEOUS | 3 refills | Status: DC

## 2023-10-31 MED ORDER — INSULIN GLARGINE 100 UNITS/ML SOLOSTAR PEN: 35.0000 [IU] | PEN_INJECTOR | Freq: Every day | SUBCUTANEOUS | 3 refills | Status: DC

## 2023-10-31 MED ORDER — FREESTYLE LIBRE 3 READER DEVI: 1.0000 | Freq: Once | 0 refills | Status: AC

## 2023-10-31 MED ORDER — FREESTYLE LIBRE 3 PLUS SENSOR MISC: 3 refills | Status: DC

## 2023-10-31 NOTE — Progress Notes (Signed)
 Endocrinology Follow Up Note       10/31/2023, 3:37 PM   Subjective:    Patient ID: Anne Carney, female    DOB: Dec 04, 1975.  Anne Carney is being seen in follow up after being seen in consultation for management of currently uncontrolled symptomatic diabetes requested by  Cook, Jayce G, DO.   Past Medical History:  Diagnosis Date   Diabetes mellitus    Hypertension    Kidney disease    Neuropathy     Past Surgical History:  Procedure Laterality Date   BIOPSY  09/25/2021   Procedure: BIOPSY;  Surgeon: Vinetta Greening, DO;  [facility]: [facility];  Service: Endoscopy;;   CESAREAN SECTION     COLONOSCOPY WITH PROPOFOL  N/A 09/25/2021   Procedure: COLONOSCOPY WITH PROPOFOL ;  Surgeon: Vinetta Greening, DO;  Location: [facility];  Service: Endoscopy;  Laterality: N/A;  9:30am, ASA 3   COLONOSCOPY WITH PROPOFOL  N/A 04/24/2022   Procedure: COLONOSCOPY WITH PROPOFOL ;  Surgeon: Vinetta Greening, DO;  Location: [facility];  Service: Endoscopy;  Laterality: N/A;  12:00pm, asa 2   ESOPHAGOGASTRODUODENOSCOPY (EGD) WITH PROPOFOL  N/A 09/25/2021   Procedure: ESOPHAGOGASTRODUODENOSCOPY (EGD) WITH PROPOFOL ;  Surgeon: Vinetta Greening, DO;  [facility]: [facility];  Service: Endoscopy;  Laterality: N/A;   POLYPECTOMY  04/24/2022   Procedure: POLYPECTOMY INTESTINAL;  Surgeon: Vinetta Greening, DO;  Location: [facility];  Service: Endoscopy;;   TOE SURGERY Right    TOE SURGERY Left     Social History   Socioeconomic History   Marital status: Divorced    Spouse name: Not on file   Number of children: Not on file   Years of education: Not on file   Highest education level: Not on file  Occupational History   Not on file  Tobacco Use   Smoking status: Former    Types: Cigarettes    Passive exposure: Past   Smokeless tobacco: Never  Vaping Use   Vaping status: Never Used  Substance and Sexual  Activity   Alcohol use: Yes    Comment: occ.   Drug use: Not Currently    Types: Marijuana    Comment: occasionally   Sexual activity: Yes    Birth control/protection: None  Other Topics Concern   Not on file  Social History Narrative   Not on file   Social Drivers of Health   Financial Resource Strain: Not on file  Food Insecurity: Patient Unable To Answer (07/03/2023)   Hunger Vital Sign    Worried About Running Out of Food in the Last Year: Patient unable to answer    Ran Out of Food in the Last Year: Patient unable to answer  Transportation Needs: Patient Unable To Answer (07/03/2023)   PRAPARE - Transportation    Lack of Transportation (Medical): Patient unable to answer    Lack of Transportation (Non-Medical): Patient unable to answer  Physical Activity: Not on file  Stress: Not on file  Social Connections: Not on file    Family History  Problem Relation Age of Onset   Asthma Mother    Huntington's disease Father    Asthma Daughter    Diabetes  Daughter    Asthma Son     Outpatient Encounter Medications as of 10/31/2023  Medication Sig   blood glucose meter kit and supplies KIT Dispense based on patient and insurance preference. Use to check sugar up ro four times daily as directed.   Continuous Glucose Receiver (FREESTYLE LIBRE 3 READER) DEVI 1 Device by Does not apply route once for 1 dose.   Continuous Glucose Sensor (FREESTYLE LIBRE 3 PLUS SENSOR) MISC Change sensor every 15 days.   furosemide  (LASIX ) 20 MG tablet Take 1 tablet (20 mg total) by mouth daily.   gabapentin  (NEURONTIN ) 100 MG capsule Take 2 capsules (200 mg total) by mouth 2 (two) times daily.   glucose blood (CONTOUR NEXT TEST) test strip Use as instructed to monitor glucose 4 times daily.   Insulin  Pen Needle 32G X 4 MM MISC Use up to 5 times daily to administer insulin .   losartan  (COZAAR ) 50 MG tablet Take 1 tablet (50 mg total) by mouth daily.   meclizine  (ANTIVERT ) 25 MG tablet Take 1 tablet  (25 mg total) by mouth 3 (three) times daily as needed for dizziness.   metoprolol  tartrate (LOPRESSOR ) 25 MG tablet Take 1 tablet (25 mg total) by mouth 2 (two) times daily.   rosuvastatin  (CRESTOR ) 10 MG tablet Take 1 tablet (10 mg total) by mouth daily.   [DISCONTINUED] insulin  aspart (NOVOLOG  FLEXPEN) 100 UNIT/ML FlexPen Inject 10-16 Units into the skin 3 (three) times daily with meals.   [DISCONTINUED] insulin  glargine (LANTUS ) 100 unit/mL SOPN Inject 35 Units into the skin at bedtime.   acetaminophen  (TYLENOL ) 325 MG tablet Take 2 tablets (650 mg total) by mouth every 6 (six) hours as needed for mild pain (pain score 1-3) (or Fever >/= 101). (Patient not taking: Reported on 10/31/2023)   insulin  aspart (NOVOLOG  FLEXPEN) 100 UNIT/ML FlexPen Inject 10-16 Units into the skin 3 (three) times daily with meals.   insulin  glargine (LANTUS ) 100 unit/mL SOPN Inject 35 Units into the skin at bedtime.   Iron , Ferrous Sulfate , 325 (65 Fe) MG TABS Take 325 mg by mouth every other day. (Patient not taking: Reported on 10/31/2023)   lidocaine  (XYLOCAINE ) 2 % solution Use as directed 15 mLs in the mouth or throat every 6 (six) hours as needed for mouth pain (mouth and Throat pain). (Patient not taking: Reported on 10/31/2023)   No facility-administered encounter medications on file as of 10/31/2023.    ALLERGIES: No Known Allergies  VACCINATION STATUS: Immunization History  Administered Date(s) Administered   Influenza, Seasonal, Injecte, Preservative Fre 03/25/2023   Influenza,inj,Quad PF,6+ Mos 04/02/2018   Influenza,trivalent, recombinat, inj, PF 04/14/2017   PPD Test 10/16/2022   Pneumococcal Polysaccharide-23 11/22/2015    Diabetes She presents for her follow-up diabetic visit. She has type 2 diabetes mellitus. Onset time: diagnosed at approx age of 12. Her disease course has been improving. There are no hypoglycemic associated symptoms. Associated symptoms include fatigue and foot paresthesias.  There are no hypoglycemic complications. Diabetic complications include PVD (hx partial toe amputation both feet). Risk factors for coronary artery disease include diabetes mellitus, dyslipidemia, family history and hypertension. Current diabetic treatment includes intensive insulin  program. She is compliant with treatment most of the time. Her weight is fluctuating minimally. She is following a generally unhealthy diet. When asked about meal planning, she reported none. She has not had a previous visit with a dietitian. She participates in exercise intermittently. Her home blood glucose trend is fluctuating dramatically. Her breakfast blood glucose range  is generally >200 mg/dl. Her lunch blood glucose range is generally >200 mg/dl. Her dinner blood glucose range is generally >200 mg/dl. Her bedtime blood glucose range is generally >200 mg/dl. Her overall blood glucose range is >200 mg/dl. (She presents today, with her CGM showing dramatically fluctuating glycemic profile overall.    Insurance denied CGM coverage for unknown reasons, she says she did reach out and they will cover the [place] but will still cost her $75 per month.  Her POCT A1c today is 8.5%, improving from last visit of 9.7%.  She notes she still sips on tea throughout the day at work.  She has been experiencing more liver issues, has appt with specialist coming up.  She admits she could do better with her diet.  She also admits to taking more insulin  on occasions where glucose reads HIGH.  Analysis of her CGM shows TIR 26%, TBR < 1%, TAR 74% with a GMI of 9.4%.) An ACE inhibitor/angiotensin II receptor blocker is being taken. She does not see a podiatrist.Eye exam is current.     Review of systems  Constitutional: + Minimally fluctuating body weight,  current Body mass index is 32.82 kg/m. , no fatigue, no subjective hyperthermia, no subjective hypothermia Eyes: no blurry vision, no xerophthalmia ENT: no sore throat, no nodules palpated in  throat, no dysphagia/odynophagia, no hoarseness Cardiovascular: no chest pain, no shortness of breath, no palpitations, + leg swelling Respiratory: no cough, no shortness of breath Gastrointestinal: no nausea/vomiting/diarrhea, + intermittent abdominal pain Musculoskeletal: no muscle/joint aches Skin: no rashes, no hyperemia Neurological: no tremors, no numbness, no tingling, no dizziness Psychiatric: no depression, no anxiety  Objective:     BP 128/80 (BP Location: Left Arm, Patient Position: Sitting, Cuff Size: Large)   Pulse 95   Ht 5\' 5"  (1.651 m)   Wt 197 lb 3.2 oz (89.4 kg)   BMI 32.82 kg/m   Wt Readings from Last 3 Encounters:  10/31/23 197 lb 3.2 oz (89.4 kg)  10/25/23 192 lb (87.1 kg)  10/14/23 195 lb 6.4 oz (88.6 kg)     BP Readings from Last 3 Encounters:  10/31/23 128/80  10/30/23 (!) 173/82  10/25/23 137/83      Physical Exam- Limited  Constitutional:  Body mass index is 32.82 kg/m. , not in acute distress, normal state of mind Eyes:  EOMI, no exophthalmos Musculoskeletal: no gross deformities, strength intact in all four extremities, no gross restriction of joint movements Skin:  no rashes, no hyperemia Neurological: no tremor with outstretched hands   Diabetic Foot Exam - Simple   No data filed     CMP ( most recent) CMP     Component Value Date/Time   NA 140 10/10/2023 1534   K 4.9 10/10/2023 1534   CL 101 10/10/2023 1534   CO2 26 10/10/2023 1534   GLUCOSE 157 (H) 10/10/2023 1534   GLUCOSE 198 (H) 07/05/2023 0454   BUN 26 (H) 10/10/2023 1534   CREATININE 1.17 (H) 10/10/2023 1534   CREATININE 0.87 10/19/2022 1139   CALCIUM  9.4 10/10/2023 1534   PROT 6.9 10/10/2023 1534   ALBUMIN 4.1 10/10/2023 1534   AST 75 (H) 10/10/2023 1534   ALT 52 (H) 10/10/2023 1534   ALKPHOS 562 (H) 10/10/2023 1534   BILITOT 0.2 10/10/2023 1534   EGFR 57 (L) 10/10/2023 1534   GFRNONAA >60 07/05/2023 0454     Diabetic Labs (most recent): Lab Results   Component Value Date   HGBA1C 8.5 (A) 10/31/2023  HGBA1C 9.7 (A) 07/25/2023   HGBA1C 8.7 (A) 01/16/2023   MICROALBUR 17.2 10/04/2022     Lipid Panel ( most recent) Lipid Panel     Component Value Date/Time   CHOL 234 (H) 10/10/2023 1534   TRIG 43 10/10/2023 1534   HDL 110 10/10/2023 1534   CHOLHDL 2.1 10/10/2023 1534   CHOLHDL 2.4 10/04/2022 1530   LDLCALC 117 (H) 10/10/2023 1534   LDLCALC 108 (H) 10/04/2022 1530   LABVLDL 7 10/10/2023 1534      No results found for: "TSH", "FREET4"         Assessment & Plan:   1) Type 2 diabetes mellitus with hypoglycemia without coma, with long-term current use of insulin  (HCC)  She presents today, with her CGM showing dramatically fluctuating glycemic profile overall.    Insurance denied CGM coverage for unknown reasons, she says she did reach out and they will cover the [place] but will still cost her $75 per month.  Her POCT A1c today is 8.5%, improving from last visit of 9.7%.  She notes she still sips on tea throughout the day at work.  She has been experiencing more liver issues, has appt with specialist coming up.  She admits she could do better with her diet.  She also admits to taking more insulin  on occasions where glucose reads HIGH.  Analysis of her CGM shows TIR 26%, TBR < 1%, TAR 74% with a GMI of 9.4%.  - Ahmari Garton has currently uncontrolled symptomatic type 2 DM since 48 years of age.   -Recent labs reviewed.  - I had a long discussion with her about the progressive nature of diabetes and the pathology behind its complications. -her diabetes is complicated by neuropathy, PVD- partial amputation of toe on bilateral feet and she remains at a high risk for more acute and chronic complications which include CAD, CVA, CKD, retinopathy, and neuropathy. These are all discussed in detail with her.  The following Lifestyle Medicine recommendations according to American College of Lifestyle Medicine [facility]) were discussed and  offered to patient and she agrees to start the journey:  A. Whole Foods, Plant-based plate comprising of fruits and vegetables, plant-based proteins, whole-grain carbohydrates was discussed in detail with the patient.   A list for source of those nutrients were also provided to the patient.  Patient will use only water or unsweetened tea for hydration. B.  The need to stay away from risky substances including alcohol, smoking; obtaining 7 to 9 hours of restorative sleep, at least 150 minutes of moderate intensity exercise weekly, the importance of healthy social connections,  and stress reduction techniques were discussed. C.  A full color page of  Calorie density of various food groups per pound showing examples of each food groups was provided to the patient.  - Nutritional counseling repeated at each appointment due to patients tendency to fall back in to old habits.  - The patient admits there is a room for improvement in their diet and drink choices. -  Suggestion is made for the patient to avoid simple carbohydrates from their diet including Cakes, Sweet Desserts / Pastries, Ice Cream, Soda (diet and regular), Sweet Tea, Candies, Chips, Cookies, Sweet Pastries, Store Bought Juices, Alcohol in Excess of 1-2 drinks a day, Artificial Sweeteners, Coffee Creamer, and "Sugar-free" Products. This will help patient to have stable blood glucose profile and potentially avoid unintended weight gain.   - I encouraged the patient to switch to unprocessed or minimally processed complex starch  and increased protein intake (animal or plant source), fruits, and vegetables.   - Patient is advised to stick to a routine mealtimes to eat 3 meals a day and avoid unnecessary snacks (to snack only to correct hypoglycemia).  - I have approached her with the following individualized plan to manage her diabetes and patient agrees:   -She is advised to continue Lantus  35 units SQ nightly and continue Novolog  to 10-16  units TID with meals if glucose is above 90 and she is eating (Specific instructions on how to titrate insulin  dosage based on glucose readings given to patient in writing).  I urged her not to take more than the prescribed amount and to be consistent with taking her medications in general so I can make positive changes in her treatment plan.  -she is encouraged to continue monitoring glucose 4 times daily (using her CGM), before meals and before bed, and to call the clinic if she has readings less than 70 or above 300 for 3 tests in a row.   - she is warned not to take insulin  without proper monitoring per orders. - Adjustment parameters are given to her for hypo and hyperglycemia in writing.  -Will consider GLP1 at next visit, she has regained some weight unintentionally lost.  Cost may still be an issue though.  - Specific targets for  A1c; LDL, HDL, and Triglycerides were discussed with the patient.  2) Blood Pressure /Hypertension:  her blood pressure is not controlled to target today.   she is advised to continue her current medications including Lisinopril /HCT 10-12.5 mg p.o. daily with breakfast.  3) Lipids/Hyperlipidemia:    Review of her recent lipid panel from 10/04/22 showed uncontrolled LDL at 108 .  she is advised to continue Crestor  20 mg daily at bedtime.  Side effects and precautions discussed with her.  4)  Weight/Diet:  her Body mass index is 32.82 kg/m.  -  she is NOT a candidate for major weight loss. I discussed with her the fact that loss of 5 - 10% of her  current body weight will have the most impact on her diabetes management.  Exercise, and detailed carbohydrates information provided  -  detailed on discharge instructions.  5) Chronic Care/Health Maintenance: -she is on ACEI/ARB and Statin medications and is encouraged to initiate and continue to follow up with Ophthalmology, Dentist, Podiatrist at least yearly or according to recommendations, and advised to stay away  from smoking. I have recommended yearly flu vaccine and pneumonia vaccine at least every 5 years; moderate intensity exercise for up to 150 minutes weekly; and sleep for at least 7 hours a day.  - she is advised to maintain close follow up with Cook, Jayce G, DO for primary care needs, as well as her other providers for optimal and coordinated care.     I spent  43  minutes in the care of the patient today including review of labs from CMP, Lipids, Thyroid Function, Hematology (current and previous including abstractions from other facilities); face-to-face time discussing  her blood glucose readings/logs, discussing hypoglycemia and hyperglycemia episodes and symptoms, medications doses, her options of short and long term treatment based on the latest standards of care / guidelines;  discussion about incorporating lifestyle medicine;  and documenting the encounter. Risk reduction counseling performed per USPSTF guidelines to reduce obesity and cardiovascular risk factors.     Please refer to Patient Instructions for Blood Glucose Monitoring and Insulin /Medications Dosing Guide"  in media tab for  additional information. Please  also refer to " Patient Self Inventory" in the Media  tab for reviewed elements of pertinent patient history.  Johnette Naegeli participated in the discussions, expressed understanding, and voiced agreement with the above plans.  All questions were answered to her satisfaction. she is encouraged to contact clinic should she have any questions or concerns prior to her return visit.     Follow up plan: - Return in about 4 months (around 03/02/2024) for Diabetes F/U with A1c in office, No previsit labs, Bring meter and logs.   Hulon Magic, [facility] [address] [place], [place] [address] Phone: [phone] Fax: [phone]  10/31/2023, 3:37 PM

## 2023-11-01 ENCOUNTER — Ambulatory Visit: Admitting: Allergy & Immunology

## 2023-11-02 LAB — URINE CULTURE: Culture: >=100000 — AB

## 2023-11-04 ENCOUNTER — Ambulatory Visit (HOSPITAL_COMMUNITY): Payer: Self-pay

## 2023-11-04 MED ORDER — NITROFURANTOIN MONOHYD MACRO 100 MG PO CAPS: 100.0000 mg | ORAL_CAPSULE | Freq: Two times a day (BID) | ORAL | 0 refills | Status: DC

## 2023-11-06 ENCOUNTER — Ambulatory Visit: Admitting: Allergy & Immunology

## 2023-11-29 ENCOUNTER — Ambulatory Visit: Admitting: Allergy & Immunology

## 2023-12-16 ENCOUNTER — Encounter: Payer: Self-pay | Admitting: Allergy & Immunology

## 2023-12-16 ENCOUNTER — Other Ambulatory Visit: Payer: Self-pay

## 2023-12-16 ENCOUNTER — Ambulatory Visit (INDEPENDENT_AMBULATORY_CARE_PROVIDER_SITE_OTHER): Admitting: Allergy & Immunology

## 2023-12-16 VITALS — BP 136/78 | HR 101 | Temp 98.1°F | Resp 18 | Ht 62.99 in | Wt 204.0 lb

## 2023-12-16 DIAGNOSIS — J3089 Other allergic rhinitis: Secondary | ICD-10-CM | POA: Diagnosis not present

## 2023-12-16 DIAGNOSIS — L299 Pruritus, unspecified: Secondary | ICD-10-CM | POA: Diagnosis not present

## 2023-12-16 DIAGNOSIS — J302 Other seasonal allergic rhinitis: Secondary | ICD-10-CM

## 2023-12-16 DIAGNOSIS — J4530 Mild persistent asthma, uncomplicated: Secondary | ICD-10-CM

## 2023-12-16 MED ORDER — TRELEGY ELLIPTA 100-62.5-25 MCG/ACT IN AEPB: 1.0000 | INHALATION_SPRAY | Freq: Every day | RESPIRATORY_TRACT | 5 refills | Status: AC

## 2023-12-16 MED ORDER — ALBUTEROL SULFATE HFA 108 (90 BASE) MCG/ACT IN AERS: 2.0000 | INHALATION_SPRAY | Freq: Four times a day (QID) | RESPIRATORY_TRACT | 2 refills | Status: AC | PRN

## 2023-12-16 NOTE — Progress Notes (Unsigned)
 FOLLOW UP  Date of Service/Encounter:  12/16/23   Assessment:   Pruritus - without clear rash   Perennial and seasonal allergic rhinitis (grasses, weeds, trees, indoor molds, outdoor molds, dog, and cockroach)   Wheezing - with spiro in the mid 70% range and no improvement with the albuterol  treatment   Complicated past medical history, including a recent fall, elevated LFTs, and a sensation akin to vertigo     Plan/Recommendations:   Patient Instructions  1. Pruritus - with positive testing to grasses, weeds, trees, indoor molds, outdoor molds, dog, and cockroach - Labs not re-ordered today since you are coding better with regards to your pruritus.  - Continue with cetirizine  10mg  twice daily as needed.  - Continue with famotidine  10mg  twice daily as needed - Continue with triamcinolone  0.1% cream twice daily as needed to the itchiest spots (avoid the face).  2. Wheezing - Lung testing looks stable. - We are not going to make any changes. - The Trelegy is meant to ne used every day for the best effect.  - Daily controller medication(s): Trelegy 100/62.5/25 one puff once daily - Prior to physical activity: albuterol  2 puffs 10-15 minutes before physical activity. - Rescue medications: albuterol  4 puffs every 4-6 hours as needed - Asthma control goals:  * Full participation in all desired activities (may need albuterol  before activity) * Albuterol  use two time or less a week on average (not counting use with activity) * Cough interfering with sleep two time or less a month * Oral steroids no more than once a year * No hospitalizations  3. Return in about 1 year (around 12/15/2024). You can have the follow up appointment with Dr. Iva or a Nurse Practicioner (our Nurse Practitioners are excellent and always have Physician oversight!).    Please inform us  of any Emergency Department visits, hospitalizations, or changes in symptoms. Call us  before going to the ED for  breathing or allergy  symptoms since we might be able to fit you in for a sick visit. Feel free to contact us  anytime with any questions, problems, or concerns.  It was a pleasure to see you again today!  Websites that have reliable patient information: 1. American Academy of Asthma, Allergy , and Immunology: www.aaaai.org 2. Food Allergy  Research and Education (FARE): foodallergy.org 3. Mothers of Asthmatics: http://www.asthmacommunitynetwork.org 4. American College of Allergy , Asthma, and Immunology: www.acaai.org      "Like" us  on Facebook and Instagram for our latest updates!      A healthy democracy works best when [employer] participate! Make sure you are registered to vote! If you have moved or changed any of your contact information, you will need to get this updated before voting! Scan the QR codes below to learn more!          Reducing Pollen Exposure  The American Academy of Allergy , Asthma and Immunology suggests the following steps to reduce your exposure to pollen during allergy  seasons.    Do not hang sheets or clothing out to dry; pollen may collect on these items. Do not mow lawns or spend time around freshly cut grass; mowing stirs up pollen. Keep windows closed at night.  Keep car windows closed while driving. Minimize morning activities outdoors, a time when pollen counts are usually at their highest. Stay indoors as much as possible when pollen counts or humidity is high and on windy days when pollen tends to remain in the air longer. Use air conditioning when possible.  Many air conditioners  have filters that trap the pollen spores. Use a HEPA room air filter to remove pollen form the indoor air you breathe.  Control of Mold Allergen   Mold and fungi can grow on a variety of surfaces provided certain temperature and moisture conditions exist.  Outdoor molds grow on plants, decaying vegetation and soil.  The major outdoor mold, Alternaria and Cladosporium,  are found in very high numbers during hot and dry conditions.  Generally, a late Summer - Fall peak is seen for common outdoor fungal spores.  Rain will temporarily lower outdoor mold spore count, but counts rise rapidly when the rainy period ends.  The most important indoor molds are Aspergillus and Penicillium.  Dark, humid and poorly ventilated basements are ideal sites for mold growth.  The next most common sites of mold growth are the bathroom and the kitchen.  Outdoor (Seasonal) Mold Control  Positive outdoor molds via skin testing: Alternaria, Bipolaris (Helminthsporium), Drechslera (Curvalaria), and Mucor  Use air conditioning and keep windows closed Avoid exposure to decaying vegetation. Avoid leaf raking. Avoid grain handling. Consider wearing a face mask if working in moldy areas.    Indoor (Perennial) Mold Control   Positive indoor molds via skin testing: Aspergillus, Penicillium, Fusarium, Aureobasidium (Pullulara), and Rhizopus  Maintain humidity below 50%. Clean washable surfaces with 5% bleach solution. Remove sources e.g. contaminated carpets.    Control of Dog or Cat Allergen  Avoidance is the best way to manage a dog or cat allergy . If you have a dog or cat and are allergic to dog or cats, consider removing the dog or cat from the home. If you have a dog or cat but don't want to find it a new home, or if your family wants a pet even though someone in the household is allergic, here are some strategies that may help keep symptoms at bay:  Keep the pet out of your bedroom and restrict it to only a few rooms. Be advised that keeping the dog or cat in only one room will not limit the allergens to that room. Don't pet, hug or kiss the dog or cat; if you do, wash your hands with soap and water. High-efficiency particulate air (HEPA) cleaners run continuously in a bedroom or living room can reduce allergen levels over time. Regular use of a high-efficiency vacuum cleaner or  a central vacuum can reduce allergen levels. Giving your dog or cat a bath at least once a week can reduce airborne allergen.  Control of Cockroach Allergen  Cockroach allergen has been identified as an important cause of acute attacks of asthma, especially in urban settings.  There are fifty-five species of cockroach that exist in the [place] , however only three, the [place], [place] and [place] species produce allergen that can affect patients with Asthma.  Allergens can be obtained from fecal particles, egg casings and secretions from cockroaches.    Remove food sources. Reduce access to water. Seal access and entry points. Spray runways with 0.5-1% Diazinon or Chlorpyrifos Blow boric acid power under stoves and refrigerator. Place bait stations (hydramethylnon) at feeding sites.      Subjective:   Anne Carney is a 49 y.o. female presenting today for follow up of  Chief Complaint  Patient presents with  . Establish Care  . Follow-up    Anne Carney has a history of the following: Patient Active Problem List   Diagnosis Date Noted  . CKD (chronic kidney disease) stage 3, GFR 30-59 ml/min (HCC) 10/28/2023  .  IDA (iron  deficiency anemia) 03/19/2023  . History of partial amputation of toe of left foot (HCC) 12/28/2022  . Proteinuria 10/05/2022  . Hyperlipidemia 10/05/2022  . Elevated alkaline phosphatase level 01/26/2022  . Elevated LFTs 11/28/2021  . Gastroesophageal reflux disease   . Primary hypertension   . DM type 2 (diabetes mellitus, type 2) (HCC) 11/21/2015    History obtained from: chart review and {Persons; PED relatives w/patient:19415::patient}.  Discussed the use of AI scribe software for clinical note transcription with the patient and/or guardian, who gave verbal consent to proceed.  Anne Carney is a 48 y.o. female presenting for {Blank single:19197::a food challenge,a drug challenge,skin testing,a sick visit,an evaluation of ***,a  follow up visit}.  We last saw her in December 2024.  At that time, we recommended that she get the labs that were ordered at the first visit.  She had testing done that was positive to grasses, weeds, trees, indoor and outdoor molds, dog, and cockroach.  She had testing the most common foods which were slightly reactive as well to sesame and cashew.  We recommended that she use her Trelegy every day progressed.  Itching has improved with the use of famotidine  as needed. She is unsure about the use of cetirizine . Skin is in good condition, and she uses a skin moisturizer, sometimes diabetic skin lotion provided by her mother.  She has ongoing concerns about liver and kidney health, describing a sensation of 'pins sticking' in her liver, constant since April. She has undergone diagnostic procedures, including a liver biopsy, and has been referred to a liver specialist in [facility] for further evaluation. She avoids alcohol and limits Tylenol  and Advil  use.  She has a history of diabetes and manages her condition with insulin . Her daughter has had diabetes since age ten, which may have contributed to her need for dialysis.  She experiences breathing issues and uses Trelegy as needed, though not daily. She has an emergency inhaler, albuterol , but does not use it frequently. She has a disc inhaler that is supposed to be used daily, but she is concerned about the cost.  No sinus infections or ear infections.  Asthma/Respiratory Symptom History: ***  Allergic Rhinitis Symptom History: ***  Food Allergy  Symptom History: ***  Skin Symptom History: ***  GERD Symptom History: ***  Infection Symptom History: ***  Otherwise, there have been no changes to her past medical history, surgical history, family history, or social history.    Review of systems otherwise negative other than that mentioned in the HPI.    Objective:   Blood pressure 136/78, pulse (!) 101, temperature 98.1 F (36.7 C),  temperature source Temporal, resp. rate 18, height 5' 2.99 (1.6 m), weight 204 lb (92.5 kg), SpO2 98%. Body mass index is 36.15 kg/m.    Physical Exam   Diagnostic studies:    Spirometry: results abnormal (FEV1: 1.41/61%, FVC: 2.37/83%, FEV1/FVC: 59%).    Spirometry consistent with moderate obstructive disease. {Blank single:19197::Albuterol /Atrovent  nebulizer,Xopenex/Atrovent  nebulizer,Albuterol  nebulizer,Albuterol  four puffs via MDI,Xopenex four puffs via MDI} treatment given in clinic with {Blank single:19197::significant improvement in FEV1 per ATS criteria,significant improvement in FVC per ATS criteria,significant improvement in FEV1 and FVC per ATS criteria,improvement in FEV1, but not significant per ATS criteria,improvement in FVC, but not significant per ATS criteria,improvement in FEV1 and FVC, but not significant per ATS criteria,no improvement}.  Allergy  Studies: {Blank single:19197::none,deferred due to recent antihistamine use,deferred due to insurance stipulations that require a separate visit for testing,labs sent instead, }    {Blank  single:19197::Allergy  testing results were read and interpreted by myself, documented by clinical staff., }      Anne Shaggy, MD  [facility] 

## 2023-12-16 NOTE — Patient Instructions (Addendum)
 1. Pruritus - with positive testing to grasses, weeds, trees, indoor molds, outdoor molds, dog, and cockroach - Labs not re-ordered today since you are coding better with regards to your pruritus.  - Continue with cetirizine  10mg  twice daily as needed.  - Continue with famotidine  10mg  twice daily as needed - Continue with triamcinolone  0.1% cream twice daily as needed to the itchiest spots (avoid the face).  2. Wheezing - Lung testing looks stable. - We are not going to make any changes. - The Trelegy is meant to ne used every day for the best effect.  - Daily controller medication(s): Trelegy 100/62.5/25 one puff once daily - Prior to physical activity: albuterol  2 puffs 10-15 minutes before physical activity. - Rescue medications: albuterol  4 puffs every 4-6 hours as needed - Asthma control goals:  * Full participation in all desired activities (may need albuterol  before activity) * Albuterol  use two time or less a week on average (not counting use with activity) * Cough interfering with sleep two time or less a month * Oral steroids no more than once a year * No hospitalizations  3. Return in about 1 year (around 12/15/2024). You can have the follow up appointment with Dr. Iva or a Nurse Practicioner (our Nurse Practitioners are excellent and always have Physician oversight!).    Please inform us  of any Emergency Department visits, hospitalizations, or changes in symptoms. Call us  before going to the ED for breathing or allergy  symptoms since we might be able to fit you in for a sick visit. Feel free to contact us  anytime with any questions, problems, or concerns.  It was a pleasure to see you again today!  Websites that have reliable patient information: 1. American Academy of Asthma, Allergy , and Immunology: www.aaaai.org 2. Food Allergy  Research and Education (FARE): foodallergy.org 3. Mothers of Asthmatics: http://www.asthmacommunitynetwork.org 4. American College of  Allergy , Asthma, and Immunology: www.acaai.org      "Like" us  on Facebook and Instagram for our latest updates!      A healthy democracy works best when [employer] participate! Make sure you are registered to vote! If you have moved or changed any of your contact information, you will need to get this updated before voting! Scan the QR codes below to learn more!          Reducing Pollen Exposure  The American Academy of Allergy , Asthma and Immunology suggests the following steps to reduce your exposure to pollen during allergy  seasons.    Do not hang sheets or clothing out to dry; pollen may collect on these items. Do not mow lawns or spend time around freshly cut grass; mowing stirs up pollen. Keep windows closed at night.  Keep car windows closed while driving. Minimize morning activities outdoors, a time when pollen counts are usually at their highest. Stay indoors as much as possible when pollen counts or humidity is high and on windy days when pollen tends to remain in the air longer. Use air conditioning when possible.  Many air conditioners have filters that trap the pollen spores. Use a HEPA room air filter to remove pollen form the indoor air you breathe.  Control of Mold Allergen   Mold and fungi can grow on a variety of surfaces provided certain temperature and moisture conditions exist.  Outdoor molds grow on plants, decaying vegetation and soil.  The major outdoor mold, Alternaria and Cladosporium, are found in very high numbers during hot and dry conditions.  Generally, a late Summer - Fall  peak is seen for common outdoor fungal spores.  Rain will temporarily lower outdoor mold spore count, but counts rise rapidly when the rainy period ends.  The most important indoor molds are Aspergillus and Penicillium.  Dark, humid and poorly ventilated basements are ideal sites for mold growth.  The next most common sites of mold growth are the bathroom and the kitchen.  Outdoor  (Seasonal) Mold Control  Positive outdoor molds via skin testing: Alternaria, Bipolaris (Helminthsporium), Drechslera (Curvalaria), and Mucor  Use air conditioning and keep windows closed Avoid exposure to decaying vegetation. Avoid leaf raking. Avoid grain handling. Consider wearing a face mask if working in moldy areas.    Indoor (Perennial) Mold Control   Positive indoor molds via skin testing: Aspergillus, Penicillium, Fusarium, Aureobasidium (Pullulara), and Rhizopus  Maintain humidity below 50%. Clean washable surfaces with 5% bleach solution. Remove sources e.g. contaminated carpets.    Control of Dog or Cat Allergen  Avoidance is the best way to manage a dog or cat allergy . If you have a dog or cat and are allergic to dog or cats, consider removing the dog or cat from the home. If you have a dog or cat but don't want to find it a new home, or if your family wants a pet even though someone in the household is allergic, here are some strategies that may help keep symptoms at bay:  Keep the pet out of your bedroom and restrict it to only a few rooms. Be advised that keeping the dog or cat in only one room will not limit the allergens to that room. Don't pet, hug or kiss the dog or cat; if you do, wash your hands with soap and water. High-efficiency particulate air (HEPA) cleaners run continuously in a bedroom or living room can reduce allergen levels over time. Regular use of a high-efficiency vacuum cleaner or a central vacuum can reduce allergen levels. Giving your dog or cat a bath at least once a week can reduce airborne allergen.  Control of Cockroach Allergen  Cockroach allergen has been identified as an important cause of acute attacks of asthma, especially in urban settings.  There are fifty-five species of cockroach that exist in the [place] , however only three, the [place], [place] and [place] species produce allergen that can affect patients with Asthma.   Allergens can be obtained from fecal particles, egg casings and secretions from cockroaches.    Remove food sources. Reduce access to water. Seal access and entry points. Spray runways with 0.5-1% Diazinon or Chlorpyrifos Blow boric acid power under stoves and refrigerator. Place bait stations (hydramethylnon) at feeding sites.

## 2023-12-17 ENCOUNTER — Other Ambulatory Visit (HOSPITAL_COMMUNITY): Payer: Self-pay

## 2023-12-17 ENCOUNTER — Telehealth: Payer: Self-pay

## 2023-12-17 ENCOUNTER — Encounter: Payer: Self-pay | Admitting: Allergy & Immunology

## 2023-12-17 NOTE — Telephone Encounter (Signed)
*  AA  Pharmacy Patient Advocate Encounter   Received notification from CoverMyMeds that prior authorization for Trelegy Ellipta  100-62.5-25MCG/ACT aerosol powder  is required/requested.   Insurance verification completed.   The patient is insured through [facility] .   Per test claim:  Breztri  Ellipta is preferred by the insurance.  If suggested medication is appropriate, Please send in a new RX and discontinue this one. If not, please advise as to why it's not appropriate so that we may request a Prior Authorization. Please note, some preferred medications may still require a PA.  If the suggested medications have not been trialed and there are no contraindications to their use, the PA will not be submitted, as it will not be approved.   Breztri  Ellipta- $15.00  CMM KEY : [id number]

## 2023-12-18 MED ORDER — BREZTRI AEROSPHERE 160-9-4.8 MCG/ACT IN AERO: 2.0000 | INHALATION_SPRAY | Freq: Two times a day (BID) | RESPIRATORY_TRACT | 5 refills | Status: AC

## 2023-12-18 MED ORDER — SPACER/AERO-HOLDING CHAMBERS DEVI: 0 refills | Status: AC

## 2023-12-18 NOTE — Telephone Encounter (Signed)
 I sent in Breztri .  Can someone call the patient and confirm that they have a spacer?

## 2023-12-18 NOTE — Addendum Note (Signed)
 Addended by: MARCINE ISAIAH CROME on: 12/18/2023 05:05 PM   Modules accepted: Orders

## 2023-12-18 NOTE — Telephone Encounter (Signed)
 Spoke with the patient and let her know changes about her inhaler to Breztri . She didn't have a spacer. Prescription was sent into [place]. Verbalized understanding.

## 2023-12-25 NOTE — Telephone Encounter (Signed)
Thank you!   Tavon Corriher, MD [facility]  

## 2024-01-01 ENCOUNTER — Ambulatory Visit: Admitting: Orthopaedic Surgery

## 2024-01-01 ENCOUNTER — Encounter: Payer: Self-pay | Admitting: Orthopaedic Surgery

## 2024-01-01 VITALS — BP 151/97 | HR 87

## 2024-01-01 DIAGNOSIS — M25532 Pain in left wrist: Secondary | ICD-10-CM | POA: Diagnosis not present

## 2024-01-01 DIAGNOSIS — G8929 Other chronic pain: Secondary | ICD-10-CM | POA: Diagnosis not present

## 2024-01-01 NOTE — Progress Notes (Signed)
 My wrist hurts now.  She has history of [place] Jewell on the left.  That is better but she has wrist pain now for over six to eight weeks.  She has no trauma.  She has diabetes and some reflux.  She has no redness, no numbness.  Right wrist is negative.  Left wrist is tender but has full ROM.  No increased pain to the first extensor compartment.  No swelling.  NV intact.  Encounter Diagnosis  Name Primary?   Wrist pain, chronic, left Yes   I will have her take Aleve  or ibuprofen  for the wrist but take it after eating.  Resume her splint.  Return in two weeks.  Call if any problem.  Precautions discussed.  Electronically Signed Lemond Stable, MD 7/23/20259:08 AM

## 2024-01-15 ENCOUNTER — Ambulatory Visit: Admitting: Orthopaedic Surgery

## 2024-01-21 ENCOUNTER — Encounter: Payer: Self-pay | Admitting: Gastroenterology

## 2024-01-21 ENCOUNTER — Ambulatory Visit: Admitting: Gastroenterology

## 2024-01-21 ENCOUNTER — Other Ambulatory Visit (HOSPITAL_COMMUNITY): Payer: Self-pay | Admitting: Nephrology

## 2024-01-21 VITALS — BP 146/88 | HR 93 | Temp 97.9°F | Ht 64.0 in | Wt 208.2 lb

## 2024-01-21 DIAGNOSIS — E1122 Type 2 diabetes mellitus with diabetic chronic kidney disease: Secondary | ICD-10-CM

## 2024-01-21 DIAGNOSIS — R7989 Other specified abnormal findings of blood chemistry: Secondary | ICD-10-CM

## 2024-01-21 DIAGNOSIS — R600 Localized edema: Secondary | ICD-10-CM | POA: Diagnosis not present

## 2024-01-21 DIAGNOSIS — K5900 Constipation, unspecified: Secondary | ICD-10-CM | POA: Diagnosis not present

## 2024-01-21 DIAGNOSIS — D649 Anemia, unspecified: Secondary | ICD-10-CM | POA: Insufficient documentation

## 2024-01-21 DIAGNOSIS — N1831 Chronic kidney disease, stage 3a: Secondary | ICD-10-CM

## 2024-01-21 DIAGNOSIS — R1011 Right upper quadrant pain: Secondary | ICD-10-CM | POA: Insufficient documentation

## 2024-01-21 NOTE — Progress Notes (Signed)
 GI Office Note    Referring Provider: Cook, Jayce G, DO Primary Care Physician:  Cook, Jayce G, DO  Primary Gastroenterologist: Carlin POUR. Cindie, DO   Chief Complaint   Chief Complaint  Patient presents with   Follow-up    Follow up. Still having some pains on the right side and elevated LFT's     History of Present Illness   Anne Carney is a 48 y.o. female presenting today for follow up. Last seen 03/2023. She has h/o GERD, LUQ pain, early satiety/nausea, elevated LFTs, IDA.   She has chronically elevated LFTs, mostly elevated alkaline phosphatase of unclear etiology. Liver biopsy inconclusive as outlined below. Extensive serologies as outlined. She has seen Dr. Zamor, [facility] last month. She was started on urso 300mg  BID. She stopped it two weeks later due to itching. She had also started Jardiance around the same time and stopped it as well. Restarted Jardiance today to see if it causes itching. Her son, Javonte Murchinson, a patient here, has had nonspecific liver biopsy, elevated ferritin/iron sat, neg HFE genetics. Referral to [facility] due to inconclusive work up.    Discussed the use of AI scribe software for clinical note transcription with the patient, who gave verbal consent to proceed.   She experiences abdominal pain, particularly in the right upper quadrant, which worsens with straining during bowel movements. The pain was severe at the time of making the appointment but has since eased somewhat. No blood in stool and the pain does not significantly worsen after meals.  She was prescribed ursodiol 300 mg twice daily by Dr. Zamor in July but discontinued it after two weeks due to itching, which she suspects might be related to either the ursodiol or Jardiance. She stopped both. Started back on Jardiance today to see if the itching returns.   She has a history of elevated alkaline phosphatase levels, which she understands can contribute to itching. Itching  primarily occurs in her legs, which she attributes to swelling and stretching of the skin. She has a history of fluid retention in her legs, for which she takes furosemide. She finds it difficult to wear compression socks due to the swelling. She works two to three days a week, often on her feet, which may exacerbate the swelling.She reports a poor appetite and occasional heartburn, which she associates with spicy foods. She has been trying to avoid such foods to manage her symptoms. She uses Linzess as needed for BMs, typically 1-2 times per week, if she takes more then she has excessive BMs.  Her family history includes Huntington's disease on her father's side, affecting her father, several of his siblings, and her half-brother, who passed away from the condition. Her mother, aged 21, has diabetes.      Wt Readings from Last 10 Encounters:  01/21/24 208 lb 3.2 oz (94.4 kg)  12/16/23 204 lb (92.5 kg)  10/31/23 197 lb 3.2 oz (89.4 kg)  10/25/23 192 lb (87.1 kg)  10/14/23 195 lb 6.4 oz (88.6 kg)  10/10/23 195 lb 6.4 oz (88.6 kg)  07/25/23 175 lb 3.2 oz (79.5 kg)  07/16/23 171 lb 8 oz (77.8 kg)  07/05/23 184 lb 11.9 oz (83.8 kg)  06/28/23 192 lb (87.1 kg)   Prior Data   Labs from 12/2023: ANA IFA negative, hemoglobin 10.2, hematocrit 31.8, platelets 169, creatinine 1.44, BUN 42  Labs 10/2023: creatinine 1.17, AP 562, AST 75, ALT 52.  Liver biopsy 05/2023:Liver biopsy with overall features  of minimal portal histiocytic inflammation without bile duct injury. Could indicate resolved liver injury with etiology not apparent on histology but considerations include infection, drug induced liver injury, supplement toxin   Dr. Zamor had reread of liver biopsy: Final Diagnosis   A.  Liver biopsy (slides labeled [id number] from [facility], date of biopsy: 06/06/2023):  - Liver parenchyma with scattered portal ceroid laden macrophages (see comment).  Electronically signed by Channie Box, MD on  12/26/2023 at 1458 EDT  Comment   The biopsy shows essentially normal liver parenchyma with scatter portal ceroid laden macrophages .  No significant inflammation is present.  No steatosis is seen.  No evidence of bile duct damage is present.  Trichrome stain shows no significant fibrosis.  No iron intrahepatocellular deposits are seen. No PAS/D intrahepatocellular globules are present.   Summary: The overall findings are minimal and nonspecific.  The presence of ceroid laden macrophages may indicate prior injury, drug effect and others.  Morphological features of primary biliary cholangitis are not seen.   Labs during recent hospitalization, 03/2023: Hgb 11.2, platelet 209K, alb 2.6, Tbili 0.6, AP 322-->253H, AST 21, ALT 21, Tbili 0.6. Glucose 503. UDS negative. Ethanol <3. Creatinine 0.88    Labs from April 2024: PBC panel negative.  Hemoglobin 11.6, hematocrit 37, MCV 86.4, platelets normal, ferritin 13, iron 106, TIBC 432, iron saturation is 25%, B12 and folate normal.  A1c 13.1, total bilirubin 0.6, alkaline phosphatase 382, AST 50, ALT 38, albumin 3.8.   Labs from May 2024: GGT 238, alkaline phosphatase 364, intestinal isoenzyme 0, bone isoenzymes 23, liver enzymes 63, macro hepatic isoenzymes 14.   Labs for elevated LFTs March 2023, LFTs normal except for alk phos of 251.  Hep B and C screen negative.  AMA less than 20.  Platelets slightly low, 142,000.  No iron overload or trend towards iron deficiency.  A1c was 11.5. Labs from May 2023: GGT 163.  Abdominal ultrasound April 2023: Liver unremarkable, 3.7 cm left renal upper pole mass noted.   CT abdomen with and without contrast April 2023 for renal mass: Liver unremarkable.  No renal mass seen.  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    Right upper quadrant ultrasound January 2024: Normal.   MRI Abd/MRCP 03/2022: IMPRESSION: Motion degraded examination.  Within this context: 1. No acute  abnormality identified in the abdomen. 2. Moderate volume of formed stool throughout the colon. 3. Uterine leiomyomas.   CT abdomen pelvis with contrast November 2023: IMPRESSION: 1. No acute intra-abdominal or pelvic pathology. 2. Moderate colonic stool burden. No bowel obstruction. Normal appendix. 3. Uterine fibroid. 4. Displaced left tubal ligation clip.   RUQ U/S 06/2022: normal   Colonoscopy 04/2022: -two 3-64mm polyps in descending colon, stool in entire colon -repeat colonoscopy in 5 years for surveillance and borderline prep -sessile serrated adenoma.     EGD April 2023: - Z-line regular, 35 cm from the incisors. - Erythematous mucosa in the antrum. Biopsied. - Normal duodenal bulb, first portion of the duodenum and second portion of the duodenum. -Gastric biopsy with mild nonspecific reactive gastropathy, no H. Pylori   Colonoscopy April 2023: - Preparation of the colon was inadequate. - Stool in the entire examined colon. - No specimens collected. -Attempt colonoscopy in 3 to 6 months.   Medications   Current Outpatient Medications  Medication Sig Dispense Refill   acetaminophen (TYLENOL) 325 MG tablet Take 2 tablets (650 mg total) by mouth every 6 (  six) hours as needed for mild pain (pain score 1-3) (or Fever >/= 101).     albuterol (VENTOLIN HFA) 108 (90 Base) MCG/ACT inhaler Inhale 2 puffs into the lungs every 6 (six) hours as needed for wheezing or shortness of breath. 8 g 2   blood glucose meter kit and supplies KIT Dispense based on patient and insurance preference. Use to check sugar up ro four times daily as directed. 1 each 0   Continuous Glucose Sensor (FREESTYLE LIBRE 3 PLUS SENSOR) MISC Change sensor every 15 days. 6 each 3   Fluticasone-Umeclidin-Vilant (TRELEGY ELLIPTA) 100-62.5-25 MCG/ACT AEPB Inhale 1 puff into the lungs daily. 60 each 5   furosemide (LASIX) 20 MG tablet Take 1 tablet (20 mg total) by mouth daily. 90 tablet 1   gabapentin  (NEURONTIN) 100 MG capsule Take 2 capsules (200 mg total) by mouth 2 (two) times daily. 360 capsule 3   glucose blood (CONTOUR NEXT TEST) test strip Use as instructed to monitor glucose 4 times daily. 400 each 6   insulin aspart (NOVOLOG FLEXPEN) 100 UNIT/ML FlexPen Inject 10-16 Units into the skin 3 (three) times daily with meals. 45 mL 3   insulin glargine (LANTUS) 100 unit/mL SOPN Inject 35 Units into the skin at bedtime. 30 mL 3   Insulin Pen Needle 32G X 4 MM MISC Use up to 5 times daily to administer insulin. 200 each 3   losartan (COZAAR) 50 MG tablet Take 1 tablet (50 mg total) by mouth daily. 90 tablet 1   meclizine (ANTIVERT) 25 MG tablet Take 1 tablet (25 mg total) by mouth 3 (three) times daily as needed for dizziness. 30 tablet 0   metoprolol tartrate (LOPRESSOR) 25 MG tablet Take 1 tablet (25 mg total) by mouth 2 (two) times daily. 90 tablet 3   rosuvastatin (CRESTOR) 10 MG tablet Take 1 tablet (10 mg total) by mouth daily. 90 tablet 3   Spacer/Aero-Holding Chambers DEVI Use with Breztri 1 each 0   No current facility-administered medications for this visit.    Allergies   Allergies as of 01/21/2024   (No Known Allergies)      Review of Systems   General: Negative for anorexia, weight loss, fever, chills, fatigue, weakness. ENT: Negative for hoarseness, difficulty swallowing , nasal congestion. CV: Negative for chest pain, angina, palpitations, dyspnea on exertion,+ peripheral edema.  Respiratory: Negative for dyspnea at rest, dyspnea on exertion, cough, sputum, wheezing.  GI: See history of present illness. GU:  Negative for dysuria, hematuria, urinary incontinence, urinary frequency, nocturnal urination.  Endo: Negative for unusual weight change.     Physical Exam   BP (!) 146/88 (BP Location: Left Arm, Patient Position: Sitting, Cuff Size: Large) Comment: manual  Pulse 93   Temp 97.9 F (36.6 C) (Temporal)   Ht 5' 4 (1.626 m)   Wt 208 lb 3.2 oz (94.4 kg)   LMP  11/12/2023 (Approximate)   BMI 35.74 kg/m    General: Well-nourished, well-developed in no acute distress.  Eyes: No icterus. Mouth: Oropharyngeal mucosa moist and pink   Lungs: Clear to auscultation bilaterally.  Heart: Regular rate and rhythm, no murmurs rubs or gallops.  Abdomen: Bowel sounds are normal, nontender, nondistended, no hepatosplenomegaly or masses,  no abdominal bruits or hernia , no rebound or guarding.  Rectal: not performed Extremities: 2+pitting edema bilaterally. No clubbing or deformities. Neuro: Alert and oriented x 4   Skin: Warm and dry, no jaundice.   Psych: Alert and cooperative, normal mood  and affect.  Labs   Lab Results  Component Value Date   ALT 52 (H) 10/10/2023   AST 75 (H) 10/10/2023   GGT 163 (H) 10/26/2021   ALKPHOS 562 (H) 10/10/2023   BILITOT 0.2 10/10/2023   Lab Results  Component Value Date   NA 140 10/10/2023   CL 101 10/10/2023   K 4.9 10/10/2023   CO2 26 10/10/2023   BUN 26 (H) 10/10/2023   CREATININE 1.17 (H) 10/10/2023   EGFR 57 (L) 10/10/2023   CALCIUM 9.4 10/10/2023   PHOS 1.5 (L) 07/03/2023   ALBUMIN 4.1 10/10/2023   GLUCOSE 157 (H) 10/10/2023    Lab Results  Component Value Date   WBC 7.4 10/10/2023   HGB 11.5 10/10/2023   HCT 33.4 (L) 10/10/2023   MCV 90 10/10/2023   PLT 188 10/10/2023   Lab Results  Component Value Date   HGBA1C 8.5 (A) 10/31/2023    Imaging Studies   No results found.  Assessment/Plan:        Chronic elevation of LFTs, with predominance of alkaline phophatase Extensive work up as detailed above. She has seen Dr. Zamor at [facility]. Liver biopsy re-read. No findings to support PBC. Findings are minimal and nonspecific.  The presence of ceroid laden macrophages may indicate prior injury, drug effect and others.  Patient mentions FH of Huntington's Disease, unclear if a relationship has been noted. Patient has not had genetic testing, her father, 1/2 brother, paternal aunt/uncle all  succumbed to the disease.   - Resume Ursodiol 300 mg twice daily with food, preferably at lunch and supper as prescribed by [facility], fairly benign medication. Could take months to see reduction in alk phos although liver biopsy looks less concerning for PBC. - CMET, CBC, AFP, A1A phenotype, iron/tibc/ferritin   Lower extremity edema Chronic bilateral lower extremity edema. She is on furosemide but reports difficulty with compression stockings due to swelling. The edema is significant enough to cause difficulty in movement and wearing socks. No history of advanced liver disease to explain edema. Consider other sources.  - Continue furosemide as prescribed by Dr. Bluford.  - check labs  -follow up with PCP  Constipation Intermittent constipation managed with Linzess, used as needed. She experiences right upper quadrant pain when straining during bowel movements. Linzess is effective but causes frequent bowel movements if used daily. - Continue Linzess as needed for constipation management. - Monitor bowel movement frequency and adjust Linzess usage accordingly.   Sonny RAMAN. Ezzard, MHS, PA-C [facility]

## 2024-01-21 NOTE — Patient Instructions (Addendum)
 Please update labs at [facility].  Resume ursodiol as prescribed by Dr. Zamor.   Please check your medication list provided today with what you are taking at home to make sure it is accurate.   I will look into Huntington's Disease information to make sure there is not any liver correlation.

## 2024-01-29 ENCOUNTER — Ambulatory Visit: Payer: Self-pay | Admitting: Gastroenterology

## 2024-01-30 LAB — IRON,TIBC AND FERRITIN PANEL
Ferritin: 56 ng/mL (ref 15–150)
Iron Saturation: 14 % — ABNORMAL LOW (ref 15–55)
Iron: 46 ug/dL (ref 27–159)
Total Iron Binding Capacity: 335 ug/dL (ref 250–450)
UIBC: 289 ug/dL (ref 131–425)

## 2024-01-30 LAB — CBC WITH DIFFERENTIAL/PLATELET
Basophils Absolute: 0.1 x10E3/uL (ref 0.0–0.2)
Basos: 1 %
EOS (ABSOLUTE): 0.3 x10E3/uL (ref 0.0–0.4)
Eos: 4 %
Hematocrit: 33.6 % — ABNORMAL LOW (ref 34.0–46.6)
Hemoglobin: 10.7 g/dL — ABNORMAL LOW (ref 11.1–15.9)
Immature Grans (Abs): 0 x10E3/uL (ref 0.0–0.1)
Immature Granulocytes: 0 %
Lymphocytes Absolute: 1.6 x10E3/uL (ref 0.7–3.1)
Lymphs: 20 %
MCH: 29.6 pg (ref 26.6–33.0)
MCHC: 31.8 g/dL (ref 31.5–35.7)
MCV: 93 fL (ref 79–97)
Monocytes Absolute: 0.5 x10E3/uL (ref 0.1–0.9)
Monocytes: 6 %
Neutrophils Absolute: 5.6 x10E3/uL (ref 1.4–7.0)
Neutrophils: 69 %
Platelets: 162 x10E3/uL (ref 150–450)
RBC: 3.62 x10E6/uL — ABNORMAL LOW (ref 3.77–5.28)
RDW: 12.4 % (ref 11.7–15.4)
WBC: 8.1 x10E3/uL (ref 3.4–10.8)

## 2024-01-30 LAB — COMPREHENSIVE METABOLIC PANEL WITH GFR
ALT: 25 IU/L (ref 0–32)
AST: 30 IU/L (ref 0–40)
Albumin: 3.8 g/dL — ABNORMAL LOW (ref 3.9–4.9)
Alkaline Phosphatase: 369 IU/L — ABNORMAL HIGH (ref 44–121)
BUN/Creatinine Ratio: 28 — ABNORMAL HIGH (ref 9–23)
BUN: 49 mg/dL — ABNORMAL HIGH (ref 6–24)
Bilirubin Total: 0.2 mg/dL (ref 0.0–1.2)
CO2: 22 mmol/L (ref 20–29)
Calcium: 9 mg/dL (ref 8.7–10.2)
Chloride: 98 mmol/L (ref 96–106)
Creatinine, Ser: 1.72 mg/dL — ABNORMAL HIGH (ref 0.57–1.00)
Globulin, Total: 2.8 g/dL (ref 1.5–4.5)
Glucose: 219 mg/dL — ABNORMAL HIGH (ref 70–99)
Potassium: 4.7 mmol/L (ref 3.5–5.2)
Sodium: 135 mmol/L (ref 134–144)
Total Protein: 6.6 g/dL (ref 6.0–8.5)
eGFR: 36 mL/min/1.73 — ABNORMAL LOW (ref >59)

## 2024-01-30 LAB — AFP TUMOR MARKER: AFP, Serum, Tumor Marker: 2.8 ng/mL (ref 0.0–6.4)

## 2024-01-30 LAB — ALPHA-1-ANTITRYPSIN PHENOTYP: A-1 Antitrypsin: 137 mg/dL (ref 101–187)

## 2024-01-31 ENCOUNTER — Encounter: Payer: Self-pay | Admitting: Radiology

## 2024-02-04 ENCOUNTER — Ambulatory Visit (HOSPITAL_COMMUNITY)
Admission: RE | Admit: 2024-02-04 | Discharge: 2024-02-04 | Disposition: A | Discharge status: Home / Self Care | Source: Ambulatory Visit | Attending: Nephrology | Admitting: Nephrology

## 2024-02-04 DIAGNOSIS — N1831 Chronic kidney disease, stage 3a: Secondary | ICD-10-CM | POA: Diagnosis present

## 2024-02-04 DIAGNOSIS — E1122 Type 2 diabetes mellitus with diabetic chronic kidney disease: Secondary | ICD-10-CM | POA: Diagnosis present

## 2024-02-11 ENCOUNTER — Other Ambulatory Visit: Payer: Self-pay

## 2024-02-11 DIAGNOSIS — R7989 Other specified abnormal findings of blood chemistry: Secondary | ICD-10-CM

## 2024-02-12 ENCOUNTER — Other Ambulatory Visit (HOSPITAL_COMMUNITY)
Admission: RE | Admit: 2024-02-12 | Discharge: 2024-02-12 | Disposition: A | Discharge status: Home / Self Care | Source: Ambulatory Visit | Attending: Adult Health | Admitting: Adult Health

## 2024-02-12 ENCOUNTER — Encounter: Payer: Self-pay | Admitting: Adult Health

## 2024-02-12 ENCOUNTER — Ambulatory Visit (INDEPENDENT_AMBULATORY_CARE_PROVIDER_SITE_OTHER): Admitting: Adult Health

## 2024-02-12 VITALS — BP 149/83 | HR 78 | Ht 64.0 in | Wt 206.5 lb

## 2024-02-12 DIAGNOSIS — Z1211 Encounter for screening for malignant neoplasm of colon: Secondary | ICD-10-CM | POA: Insufficient documentation

## 2024-02-12 DIAGNOSIS — Z1231 Encounter for screening mammogram for malignant neoplasm of breast: Secondary | ICD-10-CM

## 2024-02-12 DIAGNOSIS — Z0000 Encounter for general adult medical examination without abnormal findings: Secondary | ICD-10-CM | POA: Insufficient documentation

## 2024-02-12 DIAGNOSIS — F32A Depression, unspecified: Secondary | ICD-10-CM

## 2024-02-12 DIAGNOSIS — F419 Anxiety disorder, unspecified: Secondary | ICD-10-CM

## 2024-02-12 DIAGNOSIS — I10 Essential (primary) hypertension: Secondary | ICD-10-CM

## 2024-02-12 DIAGNOSIS — Z01419 Encounter for gynecological examination (general) (routine) without abnormal findings: Secondary | ICD-10-CM | POA: Diagnosis not present

## 2024-02-12 DIAGNOSIS — N951 Menopausal and female climacteric states: Secondary | ICD-10-CM | POA: Insufficient documentation

## 2024-02-12 DIAGNOSIS — G479 Sleep disorder, unspecified: Secondary | ICD-10-CM

## 2024-02-12 DIAGNOSIS — N926 Irregular menstruation, unspecified: Secondary | ICD-10-CM | POA: Diagnosis not present

## 2024-02-12 DIAGNOSIS — N959 Unspecified menopausal and perimenopausal disorder: Secondary | ICD-10-CM

## 2024-02-12 HISTORY — DX: Encounter for screening mammogram for malignant neoplasm of breast: Z12.31

## 2024-02-12 LAB — HEMOCCULT GUIAC POC 1CARD (OFFICE): Fecal Occult Blood, POC: NEGATIVE

## 2024-02-12 LAB — COMPREHENSIVE METABOLIC PANEL WITH GFR
ALT: 26 IU/L (ref 0–32)
AST: 30 IU/L (ref 0–40)
Albumin: 3.9 g/dL (ref 3.9–4.9)
Alkaline Phosphatase: 343 IU/L — ABNORMAL HIGH (ref 44–121)
BUN/Creatinine Ratio: 26 — ABNORMAL HIGH (ref 9–23)
BUN: 40 mg/dL — ABNORMAL HIGH (ref 6–24)
Bilirubin Total: 0.3 mg/dL (ref 0.0–1.2)
CO2: 25 mmol/L (ref 20–29)
Calcium: 9.8 mg/dL (ref 8.7–10.2)
Chloride: 98 mmol/L (ref 96–106)
Creatinine, Ser: 1.54 mg/dL — ABNORMAL HIGH (ref 0.57–1.00)
Globulin, Total: 3 g/dL (ref 1.5–4.5)
Glucose: 161 mg/dL — ABNORMAL HIGH (ref 70–99)
Potassium: 4.8 mmol/L (ref 3.5–5.2)
Sodium: 137 mmol/L (ref 134–144)
Total Protein: 6.9 g/dL (ref 6.0–8.5)
eGFR: 41 mL/min/1.73 — ABNORMAL LOW (ref >59)

## 2024-02-12 LAB — CANCER ANTIGEN 19-9: CA 19-9: 73 U/mL — ABNORMAL HIGH (ref 0–35)

## 2024-02-12 NOTE — Progress Notes (Signed)
 Patient ID: Anne Carney, female   DOB: [birth year], 48 y.o.   MRN: [id number] History of Present Illness: Anne Carney is a 48 year old black female, divorced, [id number] in for a well woman gyn exam and pap. She is having irregular periods and waking up at night.  PCP is Dr Bluford   Current Medications, Allergies, Past Medical History, Past Surgical History, Family History and Social History were reviewed in [employer] electronic medical record.     Review of Systems: Patient denies any headaches, hearing loss, fatigue, blurred vision, shortness of breath, chest pain, abdominal pain, problems with bowel movements(has some constipation and takes linzess ), urination, or intercourse. No joint pain. +moody See HPI for positives   Physical Exam:BP (!) 149/83 (BP Location: Left Arm, Patient Position: Sitting, Cuff Size: Large)   Pulse 78   Ht 5' 4 (1.626 m)   Wt 206 lb 8 oz (93.7 kg)   LMP 01/30/2024 (Approximate)   BMI 35.45 kg/m   General:  Well developed, well nourished, no acute distress Skin:  Warm and dry Neck:  Midline trachea, normal thyroid, good ROM, no lymphadenopathy Lungs; Clear to auscultation bilaterally Breast:  No dominant palpable mass, retraction, or nipple discharge Cardiovascular: Regular rate and rhythm Abdomen:  Soft, non tender, no hepatosplenomegaly Pelvic:  External genitalia is normal in appearance, has hair bump that has burst and bleeding.  The vagina is normal in appearance. Urethra has no lesions or masses. The cervix is bulbous, pap with HR HPV genotyping performed.  Uterus is felt to be normal size, shape, and contour.  No adnexal masses or tenderness noted.Bladder is non tender, no masses felt. Rectal: Good sphincter tone, no polyps, or hemorrhoids felt.  Hemoccult negative. Extremities/musculoskeletal:  No swelling or varicosities noted, no clubbing or cyanosis Psych:  No mood changes, alert and cooperative,seems happy AA is 1 Fall risk is moderate     02/12/2024    3:44 PM 10/10/2023    2:46 PM 06/19/2023    4:18 PM  Depression screen PHQ 2/9  Decreased Interest 1 0 0  Down, Depressed, Hopeless 1 1 0  PHQ - 2 Score 2 1 0  Altered sleeping 1 1   Tired, decreased energy 3 2   Change in appetite 1 2   Feeling bad or failure about yourself  0 0   Trouble concentrating 1 0   Moving slowly or fidgety/restless 0 2   Suicidal thoughts 0 0   PHQ-9 Score 8 8   Difficult doing work/chores  Not difficult at all        02/12/2024    3:45 PM 10/10/2023    2:47 PM 03/25/2023   11:20 AM 10/04/2022    2:52 PM  GAD 7 : Generalized Anxiety Score  Nervous, Anxious, on Edge 1 0 0 0  Control/stop worrying 1 1 0 1  Worry too much - different things 1 1 0 1  Trouble relaxing 1 0 0 0  Restless 0 0 0 0  Easily annoyed or irritable 1 1 0 1  Afraid - awful might happen 0 0 0 0  Total GAD 7 Score 5 3 0 3  Anxiety Difficulty  Not difficult at all  Somewhat difficult    Upstream - 02/12/24 1601       Pregnancy Intention Screening   Does the patient want to become pregnant in the next year? No    Does the patient's partner want to become pregnant in the next year? No  Would the patient like to discuss contraceptive options today? No      Contraception Wrap Up   Current Method Female Sterilization    End Method Female Sterilization    Contraception Counseling Provided No           Examination chaperoned by Anne Salt LPN   Impression and plan: 1. Routine general medical examination at a health care facility (Primary) Pap sent Pap in 3 years if negative  Physical in 1 year Colonoscopy per GI  Labs with PCP  - Cytology - PAP( [facility])  2. Encounter for screening fecal occult blood testing Hemoccult was negative  - POCT occult blood stool  3. Irregular periods Has started skipping periods   4. Sleep disturbance Waking up at night   5. Anxiety and depression To talk with Dr Bluford   6. Primary hypertension Take BP meds, and  follow up with Dr Bluford   7. Screening mammogram for breast cancer Pt to call and schedule  - MM 3D SCREENING MAMMOGRAM BILATERAL BREAST; Future  8. Perimenopause Discussed symptoms, review handout

## 2024-02-17 LAB — CYTOLOGY - PAP
Comment: NEGATIVE
Diagnosis: UNDETERMINED — AB
High risk HPV: NEGATIVE

## 2024-02-18 ENCOUNTER — Ambulatory Visit: Payer: Self-pay | Admitting: Adult Health

## 2024-02-18 DIAGNOSIS — R87610 Atypical squamous cells of undetermined significance on cytologic smear of cervix (ASC-US): Secondary | ICD-10-CM | POA: Insufficient documentation

## 2024-02-18 MED ORDER — METRONIDAZOLE 500 MG PO TABS: 500.0000 mg | ORAL_TABLET | Freq: Two times a day (BID) | ORAL | 0 refills | Status: DC

## 2024-02-19 ENCOUNTER — Other Ambulatory Visit: Payer: Self-pay | Admitting: Adult Health

## 2024-02-19 ENCOUNTER — Encounter (HOSPITAL_COMMUNITY): Payer: Self-pay

## 2024-02-19 ENCOUNTER — Ambulatory Visit (HOSPITAL_COMMUNITY)
Admission: RE | Admit: 2024-02-19 | Discharge: 2024-02-19 | Disposition: A | Discharge status: Home / Self Care | Source: Ambulatory Visit | Attending: Adult Health | Admitting: Adult Health

## 2024-02-19 DIAGNOSIS — R2231 Localized swelling, mass and lump, right upper limb: Secondary | ICD-10-CM

## 2024-02-19 DIAGNOSIS — Z1231 Encounter for screening mammogram for malignant neoplasm of breast: Secondary | ICD-10-CM | POA: Insufficient documentation

## 2024-02-24 ENCOUNTER — Telehealth: Payer: Self-pay

## 2024-02-24 MED ORDER — INSULIN LISPRO (1 UNIT DIAL) 100 UNIT/ML (KWIKPEN): 10.0000 [IU] | PEN_INJECTOR | Freq: Three times a day (TID) | SUBCUTANEOUS | 11 refills | Status: DC

## 2024-02-24 NOTE — Telephone Encounter (Signed)
 Received a fax from [facility] stating that Novolog  is not covered okay to change to Humalog ?

## 2024-02-24 NOTE — Telephone Encounter (Signed)
 Requested Prescriptions   Signed Prescriptions Disp Refills   insulin  lispro (HUMALOG ) 100 UNIT/ML KwikPen 15 mL 11    Sig: Inject 10-16 Units into the skin 3 (three) times daily.    Authorizing Provider: REARDON, WHITNEY J    Ordering User: CLEOTILDE ROLIN RAMAN

## 2024-02-24 NOTE — Telephone Encounter (Signed)
 Yes ma'am, ok to switch, same dose

## 2024-02-28 ENCOUNTER — Ambulatory Visit: Payer: Self-pay | Admitting: Gastroenterology

## 2024-03-03 ENCOUNTER — Ambulatory Visit (HOSPITAL_COMMUNITY)
Admission: RE | Admit: 2024-03-03 | Discharge: 2024-03-03 | Disposition: A | Discharge status: Home / Self Care | Source: Ambulatory Visit | Attending: Adult Health | Admitting: Adult Health

## 2024-03-03 ENCOUNTER — Encounter (HOSPITAL_COMMUNITY): Payer: Self-pay

## 2024-03-03 DIAGNOSIS — Z1231 Encounter for screening mammogram for malignant neoplasm of breast: Secondary | ICD-10-CM | POA: Insufficient documentation

## 2024-03-03 DIAGNOSIS — R2231 Localized swelling, mass and lump, right upper limb: Secondary | ICD-10-CM

## 2024-03-05 ENCOUNTER — Telehealth: Payer: Self-pay | Admitting: *Deleted

## 2024-03-05 NOTE — Telephone Encounter (Signed)
 Patient was last seen 10/31/2023. Her next visit is 03/11/2024.

## 2024-03-05 NOTE — Telephone Encounter (Signed)
 Patient left a message asking that we email or send to her in MyChart a copy of her insulin  schedule.

## 2024-03-06 ENCOUNTER — Encounter: Payer: Self-pay | Admitting: *Deleted

## 2024-03-06 NOTE — Telephone Encounter (Signed)
 Under the media tab, there should be patient instructions that can be forwarded to her from last visit with her insulin  regimen on it.

## 2024-03-06 NOTE — Telephone Encounter (Signed)
 We have sent it to her MyChart.

## 2024-03-11 ENCOUNTER — Encounter: Payer: Self-pay | Admitting: Nurse Practitioner

## 2024-03-11 ENCOUNTER — Ambulatory Visit: Admitting: Nurse Practitioner

## 2024-03-11 VITALS — BP 124/68 | HR 80 | Ht 64.0 in | Wt 209.0 lb

## 2024-03-11 DIAGNOSIS — E782 Mixed hyperlipidemia: Secondary | ICD-10-CM

## 2024-03-11 DIAGNOSIS — Z794 Long term (current) use of insulin: Secondary | ICD-10-CM | POA: Diagnosis not present

## 2024-03-11 DIAGNOSIS — Z7985 Long-term (current) use of injectable non-insulin antidiabetic drugs: Secondary | ICD-10-CM

## 2024-03-11 DIAGNOSIS — E11649 Type 2 diabetes mellitus with hypoglycemia without coma: Secondary | ICD-10-CM | POA: Diagnosis not present

## 2024-03-11 DIAGNOSIS — I10 Essential (primary) hypertension: Secondary | ICD-10-CM

## 2024-03-11 LAB — POCT GLYCOSYLATED HEMOGLOBIN (HGB A1C): Hemoglobin A1C: 8.8 % — AB (ref 4.0–5.6)

## 2024-03-11 MED ORDER — OZEMPIC (0.25 OR 0.5 MG/DOSE) 2 MG/3ML ~~LOC~~ SOPN: PEN_INJECTOR | SUBCUTANEOUS | 1 refills | Status: DC

## 2024-03-11 NOTE — Progress Notes (Signed)
 Endocrinology Follow Up Note       03/11/2024, 4:11 PM   Subjective:    Patient ID: Anne Carney, female    DOB: 1976-02-06.  Anne Carney is being seen in follow up after being seen in consultation for management of currently uncontrolled symptomatic diabetes requested by  Cook, Jayce G, DO.   Past Medical History:  Diagnosis Date   Diabetes mellitus    Hypertension    Kidney disease    Liver disease    Neuropathy     Past Surgical History:  Procedure Laterality Date   BIOPSY  09/25/2021   Procedure: BIOPSY;  Surgeon: [name], DO;  [facility];  Service: Endoscopy;;   CESAREAN SECTION     COLONOSCOPY WITH PROPOFOL  N/A 09/25/2021   Procedure: COLONOSCOPY WITH PROPOFOL ;  Surgeon: [name], DO;  Location: [facility];  Service: Endoscopy;  Laterality: N/A;  9:30am, ASA 3   COLONOSCOPY WITH PROPOFOL  N/A 04/24/2022   Procedure: COLONOSCOPY WITH PROPOFOL ;  Surgeon: [name], DO;  [facility]: [facility];  Service: Endoscopy;  Laterality: N/A;  12:00pm, asa 2   ESOPHAGOGASTRODUODENOSCOPY (EGD) WITH PROPOFOL  N/A 09/25/2021   Procedure: ESOPHAGOGASTRODUODENOSCOPY (EGD) WITH PROPOFOL ;  Surgeon: [name], DO;  [facility];  Service: Endoscopy;  Laterality: N/A;   POLYPECTOMY  04/24/2022   Procedure: POLYPECTOMY INTESTINAL;  Surgeon: [name], DO;  [facility]: [facility];  Service: Endoscopy;;   TOE SURGERY Right    TOE SURGERY Left    TUBAL LIGATION  2000    Social History   Socioeconomic History   Marital status: Divorced    Spouse name: Not on file   Number of children: Not on file   Years of education: Not on file   Highest education level: Not on file  Occupational History   Not on file  Tobacco Use   Smoking status: Former    Types: Cigarettes    Passive exposure: Past   Smokeless tobacco: Never  Vaping Use   Vaping  status: Never Used  Substance and Sexual Activity   Alcohol use: Yes    Comment: occ.   Drug use: Not Currently    Types: Marijuana    Comment: occasionally   Sexual activity: Yes    Birth control/protection: Surgical    Comment: tubal  Other Topics Concern   Not on file  Social History Narrative   Not on file   Social Drivers of Health   Financial Resource Strain: Low Risk  (02/12/2024)   Overall Financial Resource Strain (CARDIA)    Difficulty of Paying Living Expenses: Not hard at all  Food Insecurity: No Food Insecurity (02/12/2024)   Hunger Vital Sign    Worried About Running Out of Food in the Last Year: Never true    Ran Out of Food in the Last Year: Never true  Transportation Needs: No Transportation Needs (02/12/2024)   PRAPARE - Administrator, Civil Service (Medical): No    Lack of Transportation (Non-Medical): No  Physical Activity: Inactive (02/12/2024)   Exercise Vital Sign    Days of Exercise per Week: 0 days  Minutes of Exercise per Session: 0 min  Stress: Stress Concern Present (02/12/2024)   [employer] of Occupational Health - Occupational Stress Questionnaire    Feeling of Stress: To some extent  Social Connections: Socially Integrated (02/12/2024)   Social Connection and Isolation Panel    Frequency of Communication with Friends and Family: More than three times a week    Frequency of Social Gatherings with Friends and Family: Once a week    Attends Religious Services: More than 4 times per year    Active Member of [employer] or Organizations: Yes    Attends Banker Meetings: Never    Marital Status: Living with partner    Family History  Problem Relation Age of Onset   Asthma Mother    Huntington's disease Father    Huntington's disease Brother    Asthma Daughter    Diabetes Daughter    Asthma Son    Huntington's disease Paternal Uncle    Huntington's disease Paternal Aunt     Outpatient Encounter Medications as of  03/11/2024  Medication Sig   acetaminophen  (TYLENOL ) 325 MG tablet Take 2 tablets (650 mg total) by mouth every 6 (six) hours as needed for mild pain (pain score 1-3) (or Fever >/= 101).   albuterol  (VENTOLIN  HFA) 108 (90 Base) MCG/ACT inhaler Inhale 2 puffs into the lungs every 6 (six) hours as needed for wheezing or shortness of breath.   blood glucose meter kit and supplies KIT Dispense based on patient and insurance preference. Use to check sugar up ro four times daily as directed.   Continuous Glucose Sensor (FREESTYLE LIBRE 3 PLUS SENSOR) MISC Change sensor every 15 days.   empagliflozin (JARDIANCE) 10 MG TABS tablet Take 10 mg by mouth daily.   Ferrous Sulfate  (IRON  PO) Take by mouth.   Fluticasone-Umeclidin-Vilant (TRELEGY ELLIPTA ) 100-62.5-25 MCG/ACT AEPB Inhale 1 puff into the lungs daily.   furosemide  (LASIX ) 20 MG tablet Take 1 tablet (20 mg total) by mouth daily.   gabapentin  (NEURONTIN ) 100 MG capsule Take 2 capsules (200 mg total) by mouth 2 (two) times daily.   glucose blood (CONTOUR NEXT TEST) test strip Use as instructed to monitor glucose 4 times daily.   insulin  glargine (LANTUS ) 100 unit/mL SOPN Inject 35 Units into the skin at bedtime.   insulin  lispro (HUMALOG ) 100 UNIT/ML KwikPen Inject 10-16 Units into the skin 3 (three) times daily.   Insulin  Pen Needle 32G X 4 MM MISC Use up to 5 times daily to administer insulin .   losartan  (COZAAR ) 50 MG tablet Take 1 tablet (50 mg total) by mouth daily.   meclizine  (ANTIVERT ) 25 MG tablet Take 1 tablet (25 mg total) by mouth 3 (three) times daily as needed for dizziness.   metoprolol  tartrate (LOPRESSOR ) 25 MG tablet Take 1 tablet (25 mg total) by mouth 2 (two) times daily.   Semaglutide ,0.25 or 0.5MG /DOS, (OZEMPIC , 0.25 OR 0.5 MG/DOSE,) 2 MG/3ML SOPN Inject 0.25 mg SQ weekly x 2 weeks, then increase to 0.5 mg SQ weekly thereafter.   Spacer/Aero-Holding Chambers DEVI Use with Breztri    UNABLE TO FIND Med for eyes-daily   UNABLE TO  FIND Ginsing-3 times a week   ursodiol (ACTIGALL) 300 MG capsule Take 300 mg by mouth 2 (two) times daily.   VITAMIN D PO Take by mouth.   [DISCONTINUED] metroNIDAZOLE  (FLAGYL ) 500 MG tablet Take 1 tablet (500 mg total) by mouth 2 (two) times daily. (Patient not taking: Reported on 03/11/2024)   No facility-administered encounter  medications on file as of 03/11/2024.    ALLERGIES: No Known Allergies  VACCINATION STATUS: Immunization History  Administered Date(s) Administered   Influenza, Seasonal, Injecte, Preservative Fre 03/25/2023   Influenza,inj,Quad PF,6+ Mos 04/02/2018   Influenza,trivalent, recombinat, inj, PF 04/14/2017   PPD Test 10/16/2022   Pneumococcal Polysaccharide-23 11/22/2015    Diabetes She presents for her follow-up diabetic visit. She has type 2 diabetes mellitus. Onset time: diagnosed at approx age of 2. Her disease course has been stable. There are no hypoglycemic associated symptoms. Associated symptoms include fatigue and foot paresthesias. There are no hypoglycemic complications. Diabetic complications include PVD (hx partial toe amputation both feet). Risk factors for coronary artery disease include diabetes mellitus, dyslipidemia, family history and hypertension. Current diabetic treatment includes intensive insulin  program. She is compliant with treatment most of the time. Her weight is fluctuating minimally. She is following a generally unhealthy diet. When asked about meal planning, she reported none. She has not had a previous visit with a dietitian. She participates in exercise intermittently. Her home blood glucose trend is fluctuating dramatically. Her breakfast blood glucose range is generally >200 mg/dl. Her lunch blood glucose range is generally >200 mg/dl. Her dinner blood glucose range is generally >200 mg/dl. Her bedtime blood glucose range is generally >200 mg/dl. Her overall blood glucose range is >200 mg/dl. (She presents today, with her CGM showing  dramatically fluctuating glycemic profile overall.    Her POCT A1c today is 8.8%, increasing from last visit of 8.5%.  Analysis of her CGM shows TIR 27%, TBR < 1%, TAR 73% with a GMI of 9.2%.  She has had a lot of health issues going on from kidneys to liver.  She was started on Jardiance 10 mg by nephrology.) An ACE inhibitor/angiotensin II receptor blocker is being taken. She does not see a podiatrist.Eye exam is current.     Review of systems  Constitutional: + Minimally fluctuating body weight,  current Body mass index is 35.87 kg/m. , no fatigue, no subjective hyperthermia, no subjective hypothermia Eyes: no blurry vision, no xerophthalmia ENT: no sore throat, no nodules palpated in throat, no dysphagia/odynophagia, no hoarseness Cardiovascular: no chest pain, no shortness of breath, no palpitations, + leg swelling Respiratory: no cough, no shortness of breath Gastrointestinal: no nausea/vomiting/diarrhea, + intermittent abdominal pain Musculoskeletal: no muscle/joint aches Skin: no rashes, no hyperemia Neurological: no tremors, no numbness, no tingling, no dizziness Psychiatric: no depression, no anxiety  Objective:     BP 124/68 (BP Location: Left Arm, Patient Position: Sitting, Cuff Size: Large)   Pulse 80   Ht 5' 4 (1.626 m)   Wt 209 lb (94.8 kg)   LMP 01/30/2024 (Approximate)   BMI 35.87 kg/m   Wt Readings from Last 3 Encounters:  03/11/24 209 lb (94.8 kg)  02/12/24 206 lb 8 oz (93.7 kg)  01/21/24 208 lb 3.2 oz (94.4 kg)     BP Readings from Last 3 Encounters:  03/11/24 124/68  02/12/24 (!) 149/83  01/21/24 (!) 146/88      Physical Exam- Limited  Constitutional:  Body mass index is 35.87 kg/m. , not in acute distress, normal state of mind Eyes:  EOMI, no exophthalmos Musculoskeletal: no gross deformities, strength intact in all four extremities, no gross restriction of joint movements Skin:  no rashes, no hyperemia Neurological: no tremor with outstretched  hands   Diabetic Foot Exam - Simple   No data filed     CMP ( most recent) CMP  Component Value Date/Time   NA 137 02/11/2024 1618   K 4.8 02/11/2024 1618   CL 98 02/11/2024 1618   CO2 25 02/11/2024 1618   GLUCOSE 161 (H) 02/11/2024 1618   GLUCOSE 198 (H) 07/05/2023 0454   BUN 40 (H) 02/11/2024 1618   CREATININE 1.54 (H) 02/11/2024 1618   CREATININE 0.87 10/19/2022 1139   CALCIUM  9.8 02/11/2024 1618   PROT 6.9 02/11/2024 1618   ALBUMIN 3.9 02/11/2024 1618   AST 30 02/11/2024 1618   ALT 26 02/11/2024 1618   ALKPHOS 343 (H) 02/11/2024 1618   BILITOT 0.3 02/11/2024 1618   EGFR 41 (L) 02/11/2024 1618   GFRNONAA >60 07/05/2023 0454     Diabetic Labs (most recent): Lab Results  Component Value Date   HGBA1C 8.8 (A) 03/11/2024   HGBA1C 8.5 (A) 10/31/2023   HGBA1C 9.7 (A) 07/25/2023   MICROALBUR 17.2 10/04/2022     Lipid Panel ( most recent) Lipid Panel     Component Value Date/Time   CHOL 234 (H) 10/10/2023 1534   TRIG 43 10/10/2023 1534   HDL 110 10/10/2023 1534   CHOLHDL 2.1 10/10/2023 1534   CHOLHDL 2.4 10/04/2022 1530   LDLCALC 117 (H) 10/10/2023 1534   LDLCALC 108 (H) 10/04/2022 1530   LABVLDL 7 10/10/2023 1534      No results found for: TSH, FREET4         Assessment & Plan:   1) Type 2 diabetes mellitus with hypoglycemia without coma, with long-term current use of insulin  (HCC)  She presents today, with her CGM showing dramatically fluctuating glycemic profile overall.    Her POCT A1c today is 8.8%, increasing from last visit of 8.5%.  Analysis of her CGM shows TIR 27%, TBR < 1%, TAR 73% with a GMI of 9.2%.  She has had a lot of health issues going on from kidneys to liver.  She was started on Jardiance 10 mg by nephrology.  - Roxane Puerto has currently uncontrolled symptomatic type 2 DM since 48 years of age.   -Recent labs reviewed.  - I had a long discussion with her about the progressive nature of diabetes and the pathology behind  its complications. -her diabetes is complicated by neuropathy, PVD- partial amputation of toe on bilateral feet and she remains at a high risk for more acute and chronic complications which include CAD, CVA, CKD, retinopathy, and neuropathy. These are all discussed in detail with her.  The following Lifestyle Medicine recommendations according to American College of Lifestyle Medicine [facility]) were discussed and offered to patient and she agrees to start the journey:  A. Whole Foods, Plant-based plate comprising of fruits and vegetables, plant-based proteins, whole-grain carbohydrates was discussed in detail with the patient.   A list for source of those nutrients were also provided to the patient.  Patient will use only water or unsweetened tea for hydration. B.  The need to stay away from risky substances including alcohol, smoking; obtaining 7 to 9 hours of restorative sleep, at least 150 minutes of moderate intensity exercise weekly, the importance of healthy social connections,  and stress reduction techniques were discussed. C.  A full color page of  Calorie density of various food groups per pound showing examples of each food groups was provided to the patient.  - Nutritional counseling repeated at each appointment due to patients tendency to fall back in to old habits.  - The patient admits there is a room for improvement in their diet and drink  choices. -  Suggestion is made for the patient to avoid simple carbohydrates from their diet including Cakes, Sweet Desserts / Pastries, Ice Cream, Soda (diet and regular), Sweet Tea, Candies, Chips, Cookies, Sweet Pastries, Store Bought Juices, Alcohol in Excess of 1-2 drinks a day, Artificial Sweeteners, Coffee Creamer, and Sugar-free Products. This will help patient to have stable blood glucose profile and potentially avoid unintended weight gain.   - I encouraged the patient to switch to unprocessed or minimally processed complex starch and  increased protein intake (animal or plant source), fruits, and vegetables.   - Patient is advised to stick to a routine mealtimes to eat 3 meals a day and avoid unnecessary snacks (to snack only to correct hypoglycemia).  - I have approached her with the following individualized plan to manage her diabetes and patient agrees:   -She is advised to continue Lantus  35 units SQ nightly and continue Novolog  to 10-16 units TID with meals if glucose is above 90 and she is eating (Specific instructions on how to titrate insulin  dosage based on glucose readings given to patient in writing). She can continue Jardiance 10 mg po daily by nephrology.  We did go over potential side effects of this medication to watch for.  I did go ahead and initiate Ozempic  0.25 mg sQ weekly x 2 weeks, then increase to 0.5 mg SQ weekly thereafter if tolerated well.  This may help her liver and kidneys as well as help control her diabetes.  -she is encouraged to continue monitoring glucose 4 times daily (using her CGM), before meals and before bed, and to call the clinic if she has readings less than 70 or above 300 for 3 tests in a row.   - she is warned not to take insulin  without proper monitoring per orders. - Adjustment parameters are given to her for hypo and hyperglycemia in writing.  - Specific targets for  A1c; LDL, HDL, and Triglycerides were discussed with the patient.  2) Blood Pressure /Hypertension:  her blood pressure is not controlled to target today.   she is advised to continue her current medications including Lisinopril /HCT 10-12.5 mg p.o. daily with breakfast.  3) Lipids/Hyperlipidemia:    Review of her recent lipid panel from 10/04/22 showed uncontrolled LDL at 108 .  she is advised to continue Crestor  20 mg daily at bedtime.  Side effects and precautions discussed with her.  4)  Weight/Diet:  her Body mass index is 35.87 kg/m.  -  she is NOT a candidate for major weight loss. I discussed with her the  fact that loss of 5 - 10% of her  current body weight will have the most impact on her diabetes management.  Exercise, and detailed carbohydrates information provided  -  detailed on discharge instructions.  5) Chronic Care/Health Maintenance: -she is on ACEI/ARB and Statin medications and is encouraged to initiate and continue to follow up with Ophthalmology, Dentist, Podiatrist at least yearly or according to recommendations, and advised to stay away from smoking. I have recommended yearly flu vaccine and pneumonia vaccine at least every 5 years; moderate intensity exercise for up to 150 minutes weekly; and sleep for at least 7 hours a day.  - she is advised to maintain close follow up with Cook, Jayce G, DO for primary care needs, as well as her other providers for optimal and coordinated care.      I spent  46  minutes in the care of the patient today including review  of labs from CMP, Lipids, Thyroid Function, Hematology (current and previous including abstractions from other facilities); face-to-face time discussing  her blood glucose readings/logs, discussing hypoglycemia and hyperglycemia episodes and symptoms, medications doses, her options of short and long term treatment based on the latest standards of care / guidelines;  discussion about incorporating lifestyle medicine;  and documenting the encounter. Risk reduction counseling performed per USPSTF guidelines to reduce obesity and cardiovascular risk factors.     Please refer to Patient Instructions for Blood Glucose Monitoring and Insulin /Medications Dosing Guide  in media tab for additional information. Please  also refer to  Patient Self Inventory in the Media  tab for reviewed elements of pertinent patient history.  Charlyne Pesa participated in the discussions, expressed understanding, and voiced agreement with the above plans.  All questions were answered to her satisfaction. she is encouraged to contact clinic should she have  any questions or concerns prior to her return visit.     Follow up plan: - Return in about 4 months (around 07/12/2024) for Diabetes F/U with A1c in office, Bring meter and logs, No previsit labs.  Benton Rio, [facility] [address] [place], [place] [address] Phone: [phone] Fax: [phone]  03/11/2024, 4:11 PM

## 2024-03-12 ENCOUNTER — Telehealth: Payer: Self-pay

## 2024-03-12 ENCOUNTER — Ambulatory Visit (INDEPENDENT_AMBULATORY_CARE_PROVIDER_SITE_OTHER): Admitting: Podiatry

## 2024-03-12 ENCOUNTER — Other Ambulatory Visit (HOSPITAL_COMMUNITY): Payer: Self-pay

## 2024-03-12 VITALS — Ht 64.0 in | Wt 209.0 lb

## 2024-03-12 DIAGNOSIS — M79674 Pain in right toe(s): Secondary | ICD-10-CM | POA: Diagnosis not present

## 2024-03-12 DIAGNOSIS — M79675 Pain in left toe(s): Secondary | ICD-10-CM | POA: Diagnosis not present

## 2024-03-12 DIAGNOSIS — B351 Tinea unguium: Secondary | ICD-10-CM

## 2024-03-12 NOTE — Telephone Encounter (Signed)
 Pharmacy Patient Advocate Encounter   Received notification from CoverMyMeds that prior authorization for Ozempic  (0.25 or 0.5 MG/DOSE) 2MG /3ML pen-injectors is required/requested.   Insurance verification completed.   The patient is insured through [facility] MEDICAID.   Per test claim: PA required; PA submitted to above mentioned insurance via Latent Key/confirmation #/[id number] [employer] Status is pending

## 2024-03-15 NOTE — Progress Notes (Signed)
  Subjective:  Patient ID: Anne Carney, female    DOB: 16-May-1976,  MRN: [id number]  Chief Complaint  Patient presents with   Nail Problem    RM 21 Patient is here for bilateral onychomycosis and nail trimming.    48 y.o. female presents with the above complaint. History confirmed with patient.  Nails are thickened and elongated.  Last A1c 8.8  Objective:  Physical Exam: warm, good capillary refill, no trophic changes or ulcerative lesions, normal DP and PT pulses, and reduced sensation at tips of toes.  Previous partial toe amputation bilateral second Left Foot: dystrophic yellowed discolored nail plates with subungual debris Right Foot: dystrophic yellowed discolored nail plates with subungual debris   Assessment:   1. Pain due to onychomycosis of toenails of both feet       Plan:  Patient was evaluated and treated and all questions answered.  Discussed the etiology and treatment options for the condition in detail with the patient. Recommended debridement of the nails today. Sharp and mechanical debridement performed of all painful and mycotic nails today. Nails debrided in length and thickness using a nail nipper to level of comfort. Discussed treatment options including appropriate shoe gear. Follow up as needed for painful nails.    Return in about 3 months (around 06/12/2024) for at risk diabetic foot care.

## 2024-03-16 NOTE — Telephone Encounter (Signed)
 Pharmacy Patient Advocate Encounter  Received notification from [facility] MEDICAID that Prior Authorization for Ozempic  (0.25 or 0.5 MG/DOSE) 2MG /3ML pen-injectors has been APPROVED from 03/12/2024 to 03/12/2025

## 2024-03-17 NOTE — Telephone Encounter (Signed)
 Patient was called and a detailed message was left sharing that she had been approved.

## 2024-03-26 ENCOUNTER — Ambulatory Visit (INDEPENDENT_AMBULATORY_CARE_PROVIDER_SITE_OTHER): Admitting: Family Medicine

## 2024-03-26 VITALS — BP 125/75 | HR 76 | Ht 64.0 in | Wt 211.0 lb

## 2024-03-26 DIAGNOSIS — E1129 Type 2 diabetes mellitus with other diabetic kidney complication: Secondary | ICD-10-CM

## 2024-03-26 DIAGNOSIS — I10 Essential (primary) hypertension: Secondary | ICD-10-CM | POA: Diagnosis not present

## 2024-03-26 DIAGNOSIS — R600 Localized edema: Secondary | ICD-10-CM

## 2024-03-26 DIAGNOSIS — R809 Proteinuria, unspecified: Secondary | ICD-10-CM

## 2024-03-26 DIAGNOSIS — Z794 Long term (current) use of insulin: Secondary | ICD-10-CM

## 2024-03-26 NOTE — Patient Instructions (Signed)
 One lab test today.  Follow up in 6 months.  Take care  Dr. Bluford

## 2024-03-27 ENCOUNTER — Telehealth: Payer: Self-pay | Admitting: Family Medicine

## 2024-03-27 NOTE — Telephone Encounter (Signed)
 Patient is requesting prescription for  meclizine  (ANTIVERT ) 25 MG tablet   [facility]

## 2024-03-28 LAB — BRAIN NATRIURETIC PEPTIDE: BNP: 21.1 pg/mL (ref 0.0–100.0)

## 2024-03-29 ENCOUNTER — Ambulatory Visit: Payer: Self-pay | Admitting: Family Medicine

## 2024-03-29 ENCOUNTER — Encounter: Payer: Self-pay | Admitting: Family Medicine

## 2024-03-29 DIAGNOSIS — R600 Localized edema: Secondary | ICD-10-CM | POA: Insufficient documentation

## 2024-03-29 MED ORDER — METOPROLOL TARTRATE 25 MG PO TABS: 25.0000 mg | ORAL_TABLET | Freq: Two times a day (BID) | ORAL | 3 refills | Status: AC

## 2024-03-29 MED ORDER — GABAPENTIN 100 MG PO CAPS: 200.0000 mg | ORAL_CAPSULE | Freq: Two times a day (BID) | ORAL | 3 refills | Status: AC

## 2024-03-29 MED ORDER — LOSARTAN POTASSIUM 50 MG PO TABS: 50.0000 mg | ORAL_TABLET | Freq: Every day | ORAL | 1 refills | Status: AC

## 2024-03-29 NOTE — Assessment & Plan Note (Signed)
 BNP to assess for the possibility of heart failure.

## 2024-03-29 NOTE — Progress Notes (Signed)
 Subjective:  Patient ID: Anne Carney, female    DOB: 1975/12/06  Age: 48 y.o. MRN: [id number]  CC:  Follow up   HPI:  48 year old female with uncontrolled type 2 diabetes, chronic kidney disease, hypertension, hyperlipidemia, anemia, chronically elevated alkaline phosphatase of unclear etiology presents for follow-up.  Type II DM remains uncontrolled.  Last A1c 8.8 earlier this month.  She follows closely with endocrinology.  Blood pressure stable on furosemide , metoprolol , and losartan .  Patient reports continued lower extremity edema.  Has had significant weight gain.  Will discuss today.  She is compliant with Lasix  per her report.  Continues to follow-up closely with GI.  Etiology for elevated alkaline phosphatase unclear.  She is on ursodiol.  Has had a prior liver biopsy which was essentially unremarkable.  She has had extensive workup.  Patient Active Problem List   Diagnosis Date Noted   Lower extremity edema 03/29/2024   ASCUS of cervix with negative high risk HPV 02/18/2024   Anxiety and depression 02/12/2024   Sleep disturbance 02/12/2024   CKD (chronic kidney disease) stage 3, GFR 30-59 ml/min (HCC) 10/28/2023   IDA (iron  deficiency anemia) 03/19/2023   History of partial amputation of toe of left foot 12/28/2022   Proteinuria 10/05/2022   Hyperlipidemia 10/05/2022   Elevated alkaline phosphatase level 01/26/2022   Elevated LFTs 11/28/2021   Gastroesophageal reflux disease    Primary hypertension    DM type 2 (diabetes mellitus, type 2) (HCC) 11/21/2015    Social Hx   Social History   Socioeconomic History   Marital status: Divorced    Spouse name: Not on file   Number of children: Not on file   Years of education: Not on file   Highest education level: Not on file  Occupational History   Not on file  Tobacco Use   Smoking status: Former    Types: Cigarettes    Passive exposure: Past   Smokeless tobacco: Never  Vaping Use   Vaping status: Never  Used  Substance and Sexual Activity   Alcohol use: Yes    Comment: occ.   Drug use: Not Currently    Types: Marijuana    Comment: occasionally   Sexual activity: Yes    Birth control/protection: Surgical    Comment: tubal  Other Topics Concern   Not on file  Social History Narrative   Not on file   Social Drivers of Health   Financial Resource Strain: Low Risk  (02/12/2024)   Overall Financial Resource Strain (CARDIA)    Difficulty of Paying Living Expenses: Not hard at all  Food Insecurity: No Food Insecurity (02/12/2024)   Hunger Vital Sign    Worried About Running Out of Food in the Last Year: Never true    Ran Out of Food in the Last Year: Never true  Transportation Needs: No Transportation Needs (02/12/2024)   PRAPARE - Administrator, Civil Service (Medical): No    Lack of Transportation (Non-Medical): No  Physical Activity: Inactive (02/12/2024)   Exercise Vital Sign    Days of Exercise per Week: 0 days    Minutes of Exercise per Session: 0 min  Stress: Stress Concern Present (02/12/2024)   [employer] of Occupational Health - Occupational Stress Questionnaire    Feeling of Stress: To some extent  Social Connections: Socially Integrated (02/12/2024)   Social Connection and Isolation Panel    Frequency of Communication with Friends and Family: More than three times a week  Frequency of Social Gatherings with Friends and Family: Once a week    Attends Religious Services: More than 4 times per year    Active Member of [employer] or Organizations: Yes    Attends Banker Meetings: Never    Marital Status: Living with partner    Review of Systems Per HPI  Objective:  BP 125/75   Pulse 76   Ht 5' 4 (1.626 m)   Wt 211 lb (95.7 kg)   BMI 36.22 kg/m      03/29/2024    9:40 PM 03/12/2024    1:06 PM 03/11/2024    3:30 PM  BP/Weight  Systolic BP 125  875  Diastolic BP 75  68  Wt. (Lbs) 211 209 209  BMI 36.22 kg/m2 35.87 kg/m2 35.87 kg/m2     Physical Exam Vitals and nursing note reviewed.  Constitutional:      General: She is not in acute distress.    Appearance: Normal appearance.  HENT:     Head: Normocephalic and atraumatic.  Cardiovascular:     Rate and Rhythm: Normal rate and regular rhythm.  Pulmonary:     Effort: Pulmonary effort is normal.     Breath sounds: Normal breath sounds.  Neurological:     Mental Status: She is alert.  Psychiatric:        Behavior: Behavior normal.     Comments: Flat affect.     Lab Results  Component Value Date   WBC 8.1 01/27/2024   HGB 10.7 (L) 01/27/2024   HCT 33.6 (L) 01/27/2024   PLT 162 01/27/2024   GLUCOSE 161 (H) 02/11/2024   CHOL 234 (H) 10/10/2023   TRIG 43 10/10/2023   HDL 110 10/10/2023   LDLCALC 117 (H) 10/10/2023   ALT 26 02/11/2024   AST 30 02/11/2024   NA 137 02/11/2024   K 4.8 02/11/2024   CL 98 02/11/2024   CREATININE 1.54 (H) 02/11/2024   BUN 40 (H) 02/11/2024   CO2 25 02/11/2024   INR 1.0 06/06/2023   HGBA1C 8.8 (A) 03/11/2024   MICROALBUR 17.2 10/04/2022     Assessment & Plan:  Primary hypertension Assessment & Plan: Stable.  Continue current medications.   Lower extremity edema Assessment & Plan: BNP to assess for the possibility of heart failure.  Orders: -     Brain natriuretic peptide  Type 2 diabetes mellitus with diabetic microalbuminuria, with long-term current use of insulin  [facility]) Assessment & Plan: Discussed with patient the importance of getting this uncontrolled.  This is her biggest risk factor.  Continue follow-up with endocrinology.   Other orders -     Gabapentin ; Take 2 capsules (200 mg total) by mouth 2 (two) times daily.  Dispense: 360 capsule; Refill: 3 -     Losartan  Potassium; Take 1 tablet (50 mg total) by mouth daily.  Dispense: 90 tablet; Refill: 1 -     Metoprolol  Tartrate; Take 1 tablet (25 mg total) by mouth 2 (two) times daily.  Dispense: 90 tablet; Refill: 3    Follow-up:  6 months  Kaneisha Ellenberger Bluford  DO [facility]

## 2024-03-29 NOTE — Assessment & Plan Note (Signed)
 Discussed with patient the importance of getting this uncontrolled.  This is her biggest risk factor.  Continue follow-up with endocrinology.

## 2024-03-29 NOTE — Assessment & Plan Note (Signed)
 Stable.  Continue current medications.

## 2024-03-30 ENCOUNTER — Telehealth: Payer: Self-pay

## 2024-03-30 ENCOUNTER — Other Ambulatory Visit: Payer: Self-pay | Admitting: Family Medicine

## 2024-03-30 ENCOUNTER — Other Ambulatory Visit: Payer: Self-pay

## 2024-03-30 DIAGNOSIS — R7989 Other specified abnormal findings of blood chemistry: Secondary | ICD-10-CM

## 2024-03-30 DIAGNOSIS — R748 Abnormal levels of other serum enzymes: Secondary | ICD-10-CM

## 2024-03-30 DIAGNOSIS — D649 Anemia, unspecified: Secondary | ICD-10-CM

## 2024-03-30 MED ORDER — MECLIZINE HCL 25 MG PO TABS: 25.0000 mg | ORAL_TABLET | Freq: Three times a day (TID) | ORAL | 0 refills | Status: AC | PRN

## 2024-03-30 NOTE — Telephone Encounter (Signed)
 Prescription Request  03/30/2024  LOV: Visit date not found  What is the name of the medication or equipment? meclizine  (ANTIVERT ) 25 MG tablet   Have you contacted your pharmacy to request a refill? Yes   Which pharmacy would you like this sent to?   [facility]     Patient notified that their request is being sent to the clinical staff for review and that they should receive a response within 2 business days.   Please advise at Mobile [phone] (mobile)

## 2024-03-30 NOTE — Addendum Note (Signed)
 Addended by: WELLINGTON MILLING on: 03/30/2024 03:35 PM   Modules accepted: Orders

## 2024-03-31 NOTE — Telephone Encounter (Signed)
 Cook, Jayce G, [place]      03/30/24 12:24 PM Rx sent in

## 2024-04-03 LAB — CBC WITH DIFFERENTIAL/PLATELET
Absolute Lymphocytes: 1738 {cells}/uL (ref 850–3900)
Absolute Monocytes: 473 {cells}/uL (ref 200–950)
Basophils Absolute: 36 {cells}/uL (ref 0–200)
Basophils Relative: 0.4 %
Eosinophils Absolute: 282 {cells}/uL (ref 15–500)
Eosinophils Relative: 3.1 %
HCT: 35.6 % (ref 35.0–45.0)
Hemoglobin: 11.5 g/dL — ABNORMAL LOW (ref 11.7–15.5)
MCH: 30.1 pg (ref 27.0–33.0)
MCHC: 32.3 g/dL (ref 32.0–36.0)
MCV: 93.2 fL (ref 80.0–100.0)
MPV: 11.3 fL (ref 7.5–12.5)
Monocytes Relative: 5.2 %
Neutro Abs: 6570 {cells}/uL (ref 1500–7800)
Neutrophils Relative %: 72.2 %
Platelets: 207 Thousand/uL (ref 140–400)
RBC: 3.82 Million/uL (ref 3.80–5.10)
RDW: 12.5 % (ref 11.0–15.0)
Total Lymphocyte: 19.1 %
WBC: 9.1 Thousand/uL (ref 3.8–10.8)

## 2024-04-03 LAB — COMPREHENSIVE METABOLIC PANEL WITH GFR
AG Ratio: 1.3 (calc) (ref 1.0–2.5)
ALT: 23 U/L (ref 6–29)
AST: 26 U/L (ref 10–35)
Albumin: 3.9 g/dL (ref 3.6–5.1)
Alkaline phosphatase (APISO): 232 U/L — ABNORMAL HIGH (ref 31–125)
BUN/Creatinine Ratio: 22 (calc) (ref 6–22)
BUN: 37 mg/dL — ABNORMAL HIGH (ref 7–25)
CO2: 30 mmol/L (ref 20–32)
Calcium: 9.4 mg/dL (ref 8.6–10.2)
Chloride: 98 mmol/L (ref 98–110)
Creat: 1.68 mg/dL — ABNORMAL HIGH (ref 0.50–0.99)
Globulin: 3 g/dL (ref 1.9–3.7)
Glucose, Bld: 129 mg/dL — ABNORMAL HIGH (ref 65–99)
Potassium: 4.5 mmol/L (ref 3.5–5.3)
Sodium: 138 mmol/L (ref 135–146)
Total Bilirubin: 0.4 mg/dL (ref 0.2–1.2)
Total Protein: 6.9 g/dL (ref 6.1–8.1)
eGFR: 37 mL/min/1.73m2 — ABNORMAL LOW (ref >=60)

## 2024-04-03 LAB — ALKALINE PHOSPHATASE ISOENZYMES
Alkaline phosphatase (APISO): 243 U/L — ABNORMAL HIGH (ref 31–125)
Bone Isoenzymes: 25 % — ABNORMAL LOW (ref 28–66)
Intestinal Isoenzymes: 6 % (ref 1–24)
Liver Isoenzymes: 56 % (ref 25–69)
Macrohepatic isoenzymes: 13 % — ABNORMAL HIGH (ref <=0)
Placental isoenzymes: 0 % (ref <=0)

## 2024-04-03 LAB — DM TEMPLATE

## 2024-04-03 LAB — CANCER ANTIGEN 19-9: CA 19-9: 54 U/mL — ABNORMAL HIGH (ref <34)

## 2024-04-03 LAB — DRUG MONITORING, PHOSPHATIDYLETHANOL (PETH), BLOOD
PETH 16:0/18:1 (POPETH): NEGATIVE ng/mL (ref <20)
PETH 16:0/18:2 (PLPETH): NEGATIVE ng/mL (ref <20)

## 2024-04-13 ENCOUNTER — Encounter: Payer: Self-pay | Admitting: Radiology

## 2024-04-30 ENCOUNTER — Encounter: Payer: Self-pay | Admitting: Orthopedic Surgery

## 2024-04-30 ENCOUNTER — Ambulatory Visit: Admitting: Orthopedic Surgery

## 2024-04-30 VITALS — BP 125/75 | Ht 64.0 in | Wt 211.0 lb

## 2024-04-30 DIAGNOSIS — R29898 Other symptoms and signs involving the musculoskeletal system: Secondary | ICD-10-CM | POA: Diagnosis not present

## 2024-04-30 DIAGNOSIS — M65341 Trigger finger, right ring finger: Secondary | ICD-10-CM | POA: Diagnosis not present

## 2024-04-30 NOTE — Progress Notes (Signed)
 New problem visit  Patient: Anne Carney           Date of Birth: 1975/08/23           MRN: [id number] Visit Date: 04/30/2024 Requested by: Cook, Jayce G, DO [address] [place],  [place] [address] PCP: Cook, Jayce G, DO    Encounter Diagnosis  Name Primary?   Trigger finger, right ring finger Yes    Chief Complaint  Patient presents with   Hand Problem    Right ring finger catches / locks in the am    This is a 48 year old female with diabetes presents with locking and catching of the right ring finger no prior treatment  Focused examination of the right ring finger reveals tenderness over the A1 pulley and with flexion extension the catching is notable.  Neurovascular exam is intact flexor tendons are intact  ASSESSMENT AND PLAN Encounter Diagnosis  Name Primary?   Trigger finger, right ring finger Yes    Recommend injection.  The patient consented for injection of the right ring finger.  We injected 40 mg of Depo-Medrol  1 cc and 1 cc 1% lidocaine .  We did confirm with timeout.  Verbal consent was given.  We used ethyl chloride and alcohol to prep the skin prior to injection  No complications noted

## 2024-04-30 NOTE — Addendum Note (Signed)
 Addended byBETHA JENEAN GREIG LELON on: 04/30/2024 03:02 PM   Modules accepted: Orders

## 2024-04-30 NOTE — Progress Notes (Signed)
  Intake history:  Chief Complaint  Patient presents with   Hand Problem    Right ring finger catches / locks in the am     BP 125/75 Comment: 03/29/24  Ht 5' 4 (1.626 m)   Wt 211 lb (95.7 kg)   BMI 36.22 kg/m  Body mass index is 36.22 kg/m.  Pharmacy? ______[facility] 14________________________________  WHAT ARE WE SEEING YOU FOR TODAY?   Right ring finger   How long has this bothered you? (DOI?DOS?WS?)  Few months  Was there an injury? No  Anticoag.  No   Any ALLERGIES _______No Known Allergies _______________________________________   Treatment:  Have you taken:  Tylenol  No  Advil  No  Had PT No  Had injection No  Other  _________________________

## 2024-04-30 NOTE — Patient Instructions (Signed)
 Physical therapy has been ordered for you at [facility]. They should call you to schedule, [phone] is the phone number to call, if you want to call to schedule.

## 2024-05-12 ENCOUNTER — Ambulatory Visit: Payer: Self-pay | Admitting: Gastroenterology

## 2024-05-12 DIAGNOSIS — R7989 Other specified abnormal findings of blood chemistry: Secondary | ICD-10-CM

## 2024-05-12 DIAGNOSIS — R748 Abnormal levels of other serum enzymes: Secondary | ICD-10-CM

## 2024-05-16 LAB — COMPREHENSIVE METABOLIC PANEL WITH GFR
ALT: 20 IU/L (ref 0–32)
AST: 28 IU/L (ref 0–40)
Albumin: 3.9 g/dL (ref 3.9–4.9)
Alkaline Phosphatase: 195 IU/L — ABNORMAL HIGH (ref 41–116)
BUN/Creatinine Ratio: 16 (ref 9–23)
BUN: 28 mg/dL — ABNORMAL HIGH (ref 6–24)
Bilirubin Total: 0.3 mg/dL (ref 0.0–1.2)
CO2: 24 mmol/L (ref 20–29)
Calcium: 9.3 mg/dL (ref 8.7–10.2)
Chloride: 95 mmol/L — ABNORMAL LOW (ref 96–106)
Creatinine, Ser: 1.75 mg/dL — ABNORMAL HIGH (ref 0.57–1.00)
Globulin, Total: 2.9 g/dL (ref 1.5–4.5)
Glucose: 269 mg/dL — ABNORMAL HIGH (ref 70–99)
Potassium: 4.5 mmol/L (ref 3.5–5.2)
Sodium: 135 mmol/L (ref 134–144)
Total Protein: 6.8 g/dL (ref 6.0–8.5)
eGFR: 35 mL/min/1.73 — ABNORMAL LOW (ref >59)

## 2024-05-16 LAB — CBC WITH DIFFERENTIAL/PLATELET
Basophils Absolute: 0 x10E3/uL (ref 0.0–0.2)
Basos: 1 %
EOS (ABSOLUTE): 0.2 x10E3/uL (ref 0.0–0.4)
Eos: 3 %
Hematocrit: 36.6 % (ref 34.0–46.6)
Hemoglobin: 11.8 g/dL (ref 11.1–15.9)
Immature Grans (Abs): 0 x10E3/uL (ref 0.0–0.1)
Immature Granulocytes: 0 %
Lymphocytes Absolute: 1.6 x10E3/uL (ref 0.7–3.1)
Lymphs: 19 %
MCH: 30 pg (ref 26.6–33.0)
MCHC: 32.2 g/dL (ref 31.5–35.7)
MCV: 93 fL (ref 79–97)
Monocytes Absolute: 0.5 x10E3/uL (ref 0.1–0.9)
Monocytes: 6 %
Neutrophils Absolute: 6.1 x10E3/uL (ref 1.4–7.0)
Neutrophils: 71 %
Platelets: 191 x10E3/uL (ref 150–450)
RBC: 3.93 x10E6/uL (ref 3.77–5.28)
RDW: 12.5 % (ref 11.7–15.4)
WBC: 8.4 x10E3/uL (ref 3.4–10.8)

## 2024-05-16 LAB — CANCER ANTIGEN 19-9: CA 19-9: 63 U/mL — ABNORMAL HIGH (ref 0–35)

## 2024-06-11 ENCOUNTER — Encounter: Payer: Self-pay | Admitting: Gastroenterology

## 2024-06-12 ENCOUNTER — Ambulatory Visit: Admitting: Podiatry

## 2024-06-16 ENCOUNTER — Other Ambulatory Visit (HOSPITAL_COMMUNITY): Payer: Self-pay

## 2024-06-16 ENCOUNTER — Telehealth: Payer: Self-pay

## 2024-06-16 NOTE — Telephone Encounter (Signed)
 Pharmacy Patient Advocate Encounter   Received notification from [facility] KEY that prior authorization for Ozempic  (0.25 or 0.5 MG/DOSE) 2MG /3ML pen-injectors is required/requested.   Insurance verification completed.   The patient is insured through [employer].   Per test claim: PA required; PA submitted to above mentioned insurance via Latent Key/confirmation #/EOC [id number] Status is pending

## 2024-06-17 NOTE — Therapy (Incomplete)
 " OUTPATIENT PHYSICAL THERAPY LOWER EXTREMITY EVALUATION   Patient Name: Anne Carney MRN: [id number] DOB:1975-07-09, 49 y.o., female Today's Date: 06/17/2024  END OF SESSION:   Past Medical History:  Diagnosis Date   Diabetes mellitus    Hypertension    Kidney disease    Liver disease    Neuropathy    Screening mammogram for breast cancer 02/12/2024   Past Surgical History:  Procedure Laterality Date   BIOPSY  09/25/2021   Procedure: BIOPSY;  Surgeon: [name], DO;  [facility];  Service: Endoscopy;;   CESAREAN SECTION     COLONOSCOPY WITH PROPOFOL  N/A 09/25/2021   Procedure: COLONOSCOPY WITH PROPOFOL ;  Surgeon: [name], DO;  Location: [facility];  Service: Endoscopy;  Laterality: N/A;  9:30am, ASA 3   COLONOSCOPY WITH PROPOFOL  N/A 04/24/2022   Procedure: COLONOSCOPY WITH PROPOFOL ;  Surgeon: [name], DO;  [facility];  Service: Endoscopy;  Laterality: N/A;  12:00pm, asa 2   ESOPHAGOGASTRODUODENOSCOPY (EGD) WITH PROPOFOL  N/A 09/25/2021   Procedure: ESOPHAGOGASTRODUODENOSCOPY (EGD) WITH PROPOFOL ;  Surgeon: [name], DO;  [facility];  Service: Endoscopy;  Laterality: N/A;   POLYPECTOMY  04/24/2022   Procedure: POLYPECTOMY INTESTINAL;  Surgeon: [name], DO;  [facility];  Service: Endoscopy;;   TOE SURGERY Right    TOE SURGERY Left    TUBAL LIGATION  2000   Patient Active Problem List   Diagnosis Date Noted   Lower extremity edema 03/29/2024   ASCUS of cervix with negative high risk HPV 02/18/2024   Anxiety and depression 02/12/2024   Sleep disturbance 02/12/2024   CKD (chronic kidney disease) stage 3, GFR 30-59 ml/min (HCC) 10/28/2023   IDA (iron  deficiency anemia) 03/19/2023   History of partial amputation of toe of left foot 12/28/2022   Proteinuria 10/05/2022   Hyperlipidemia 10/05/2022   Elevated alkaline phosphatase level 01/26/2022   Elevated LFTs 11/28/2021    Gastroesophageal reflux disease    Primary hypertension    DM type 2 (diabetes mellitus, type 2) (HCC) 11/21/2015    PCP: Cook, Jayce G, DO   REFERRING PROVIDER: Margrette Taft BRAVO, MD  REFERRING DIAG: [phone] (ICD-10-CM) - Weakness of both lower extremities  THERAPY DIAG:  No diagnosis found.  Rationale for Evaluation and Treatment: Rehabilitation  ONSET DATE: ***  SUBJECTIVE:   SUBJECTIVE STATEMENT: ***  PERTINENT HISTORY: *** PAIN:  Are you having pain? {OPRCPAIN:27236}  PRECAUTIONS: {Therapy precautions:24002}  RED FLAGS: {PT Red Flags:29287}   WEIGHT BEARING RESTRICTIONS: {Yes ***/No:24003}  FALLS:  Has patient fallen in last 6 months? {fallsyesno:27318}  LIVING ENVIRONMENT: Lives with: {OPRC lives with:25569::lives with their family} Lives in: {Lives in:25570} Stairs: {opstairs:27293} Has following equipment at home: {Assistive devices:23999}  OCCUPATION: ***  PLOF: {PLOF:24004}  PATIENT GOALS: ***  NEXT MD VISIT: ***  OBJECTIVE:  Note: Objective measures were completed at Evaluation unless otherwise noted.  DIAGNOSTIC FINDINGS: ***  PATIENT SURVEYS:  {rehab surveys:24030}  COGNITION: Overall cognitive status: {cognition:24006}     SENSATION: {sensation:27233}  EDEMA:  {edema:24020}  MUSCLE LENGTH: Hamstrings: Right *** deg; Left *** deg Debby test: Right *** deg; Left *** deg  POSTURE: {posture:25561}  PALPATION: ***  LOWER EXTREMITY ROM:  {AROM/PROM:27142} ROM Right eval Left eval  Hip flexion    Hip extension    Hip abduction    Hip adduction    Hip internal rotation    Hip external rotation    Knee flexion  Knee extension    Ankle dorsiflexion    Ankle plantarflexion    Ankle inversion    Ankle eversion     (Blank rows = not tested)  LOWER EXTREMITY MMT:  MMT Right eval Left eval  Hip flexion    Hip extension    Hip abduction    Hip adduction    Hip internal rotation    Hip external rotation     Knee flexion    Knee extension    Ankle dorsiflexion    Ankle plantarflexion    Ankle inversion    Ankle eversion     (Blank rows = not tested)  LOWER EXTREMITY SPECIAL TESTS:  {LEspecialtests:26242}  FUNCTIONAL TESTS:  5 times sit to stand: *** 2 minute walk test: ***  SLS R:  L: GAIT: Distance walked: *** Assistive device utilized: {Assistive devices:23999} Level of assistance: {Levels of assistance:24026} Comments: ***                                                                                                                                TREATMENT DATE:  06/17/2024   Evaluation: -ROM measured, Strength assessed, HEP prescribed, pt educated on prognosis, findings, and importance of HEP compliance if given.         PATIENT EDUCATION:  Education details: Pt was educated on findings of PT evaluation, prognosis, frequency of therapy visits and rationale, attendance policy, and HEP if given.   Person educated: {Person educated:25204} Education method: {Education Method:25205} Education comprehension: {Education Comprehension:25206}  HOME EXERCISE PROGRAM: ***  ASSESSMENT:  CLINICAL IMPRESSION: Patient is a 48 y.o. female who was seen today for physical therapy evaluation and treatment for R29.898 (ICD-10-CM) - Weakness of both lower extremities.   Patient demonstrates decreased LE strength, abnormal pain rating, and impaired balance. Patient also demonstrates difficulty with ambulation during today's session with decreased stride length and velocity noted. Patient also demonstrates ***. Patient requires ***. Patient would benefit from skilled physical therapy for increased endurance with ambulation, increased LE strength, and balance for improved gait quality, return to higher level of function with ADLs, and progress towards therapy goals.   OBJECTIVE IMPAIRMENTS: {opptimpairments:25111}.   ACTIVITY LIMITATIONS: {activitylimitations:27494}  PARTICIPATION  LIMITATIONS: {participationrestrictions:25113}  PERSONAL FACTORS: {Personal factors:25162} are also affecting patient's functional outcome.   REHAB POTENTIAL: {rehabpotential:25112}  CLINICAL DECISION MAKING: {clinical decision making:25114}  EVALUATION COMPLEXITY: {Evaluation complexity:25115}   GOALS: Goals reviewed with patient? {yes/no:20286}  SHORT TERM GOALS: Target date: ***  Patient will demonstrate evidence of independence with individualized HEP and will report compliance for at least 3 days per week for optimized progression towards remaining therapy goals. Baseline: *** Goal status: {GOALSTATUS:25110}  2.  Patient will report a decrease in pain level during community ambulation by at least 2 points for improved quality of life. Baseline: *** Goal status: {GOALSTATUS:25110}     LONG TERM GOALS: Target date: ***  Pt will demonstrate a an increase of at least  9 points on the LEFS for improved performance of community ambulation and ADL. Baseline: *** Goal status: {GOALSTATUS:25110}  2.  Pt will improve 2 MWT by *** in order to demonstrate improved functional ambulatory capacity in community setting.  Baseline: *** Goal status: {GOALSTATUS:25110}  3.  Pt will demonstrate WFL ROM (flexion and extension) in right knee, for increased mobility and maximal efficiency of gait cycle during ambulation. Baseline: *** Goal status: {GOALSTATUS:25110}  4.  Pt will demonstrate at least 4-/5 MMT for right lower extremity for increased strength during ADL and community ambulation. Baseline: *** Goal status: {GOALSTATUS:25110}  5.  Pt will improve *** by *** in order to improve *** during functional activities.. Baseline: *** Goal status: {GOALSTATUS:25110}    PLAN:  PT FREQUENCY: {rehab frequency:25116}  PT DURATION: {rehab duration:25117}  PLANNED INTERVENTIONS: {rehab planned interventions:25118::97110-Therapeutic exercises,97530- Therapeutic [id number]-  Neuromuscular re-education,97535- Self Rjmz,02859- Manual therapy,Patient/Family education}  PLAN FOR NEXT SESSION: ***   Lang Ada, PT, DPT [facility] Office: [phone] 3:56 PM, 06/17/2024   "

## 2024-06-18 ENCOUNTER — Ambulatory Visit (HOSPITAL_COMMUNITY)

## 2024-06-29 ENCOUNTER — Other Ambulatory Visit (HOSPITAL_COMMUNITY): Payer: Self-pay

## 2024-06-29 NOTE — Telephone Encounter (Signed)
 Pharmacy Patient Advocate Encounter  Received notification from [employer] that Prior Authorization for Ozempic  (0.25 or 0.5 MG/DOSE) 2MG /3ML pen-injectors has been APPROVED from 06/18/24 to 06/18/25. Ran test claim, Copay is $15.00. This test claim was processed through [facility]- copay amounts may vary at other pharmacies due to pharmacy/plan contracts, or as the patient moves through the different stages of their insurance plan.   PA #/Case ID/Reference #: [id number]

## 2024-07-03 ENCOUNTER — Ambulatory Visit: Payer: Self-pay | Admitting: Podiatry

## 2024-07-16 ENCOUNTER — Ambulatory Visit: Admitting: Nurse Practitioner

## 2024-07-16 ENCOUNTER — Encounter: Payer: Self-pay | Admitting: Nurse Practitioner

## 2024-07-16 VITALS — BP 122/62 | HR 66 | Ht 64.0 in | Wt 203.2 lb

## 2024-07-16 DIAGNOSIS — E11649 Type 2 diabetes mellitus with hypoglycemia without coma: Secondary | ICD-10-CM | POA: Diagnosis not present

## 2024-07-16 DIAGNOSIS — Z794 Long term (current) use of insulin: Secondary | ICD-10-CM

## 2024-07-16 DIAGNOSIS — I10 Essential (primary) hypertension: Secondary | ICD-10-CM

## 2024-07-16 DIAGNOSIS — Z7985 Long-term (current) use of injectable non-insulin antidiabetic drugs: Secondary | ICD-10-CM | POA: Diagnosis not present

## 2024-07-16 DIAGNOSIS — E782 Mixed hyperlipidemia: Secondary | ICD-10-CM

## 2024-07-16 LAB — POCT GLYCOSYLATED HEMOGLOBIN (HGB A1C): Hemoglobin A1C: 10.5 % — AB (ref 4.0–5.6)

## 2024-07-16 MED ORDER — INSULIN PEN NEEDLE 32G X 4 MM MISC: 3 refills | Status: AC

## 2024-07-16 MED ORDER — OZEMPIC (0.25 OR 0.5 MG/DOSE) 2 MG/3ML ~~LOC~~ SOPN: PEN_INJECTOR | SUBCUTANEOUS | 1 refills | Status: AC

## 2024-07-16 MED ORDER — INSULIN LISPRO (1 UNIT DIAL) 100 UNIT/ML (KWIKPEN): 10.0000 [IU] | PEN_INJECTOR | Freq: Three times a day (TID) | SUBCUTANEOUS | 11 refills | Status: AC

## 2024-07-16 MED ORDER — FREESTYLE LIBRE 3 PLUS SENSOR MISC: 3 refills | Status: AC

## 2024-07-16 MED ORDER — INSULIN GLARGINE 100 UNITS/ML SOLOSTAR PEN: 35.0000 [IU] | PEN_INJECTOR | Freq: Every day | SUBCUTANEOUS | 3 refills | Status: AC

## 2024-07-16 MED ORDER — EMPAGLIFLOZIN 10 MG PO TABS: 10.0000 mg | ORAL_TABLET | Freq: Every day | ORAL | 3 refills | Status: AC

## 2024-07-16 NOTE — Progress Notes (Signed)
 "                                                                        Endocrinology Follow Up Note       07/16/2024, 4:32 PM   Subjective:    Patient ID: Anne Carney, female    DOB: 03-01-1976.  [name] is being seen in follow up after being seen in consultation for management of currently uncontrolled symptomatic diabetes requested by  Cook, Jayce G, DO.   Past Medical History:  Diagnosis Date   Diabetes mellitus    Hypertension    Kidney disease    Liver disease    Neuropathy    Screening mammogram for breast cancer 02/12/2024    Past Surgical History:  Procedure Laterality Date   BIOPSY  09/25/2021   Procedure: BIOPSY;  Surgeon: [name], DO;  [facility]: [facility];  Service: Endoscopy;;   CESAREAN SECTION     COLONOSCOPY WITH PROPOFOL  N/A 09/25/2021   Procedure: COLONOSCOPY WITH PROPOFOL ;  Surgeon: [name], DO;  Location: [facility];  Service: Endoscopy;  Laterality: N/A;  9:30am, ASA 3   COLONOSCOPY WITH PROPOFOL  N/A 04/24/2022   Procedure: COLONOSCOPY WITH PROPOFOL ;  Surgeon: [name], DO;  [facility]: [facility];  Service: Endoscopy;  Laterality: N/A;  12:00pm, asa 2   ESOPHAGOGASTRODUODENOSCOPY (EGD) WITH PROPOFOL  N/A 09/25/2021   Procedure: ESOPHAGOGASTRODUODENOSCOPY (EGD) WITH PROPOFOL ;  Surgeon: [name], DO;  [facility]: [facility];  Service: Endoscopy;  Laterality: N/A;   POLYPECTOMY  04/24/2022   Procedure: POLYPECTOMY INTESTINAL;  Surgeon: [name], DO;  Location: [facility];  Service: Endoscopy;;   TOE SURGERY Right    TOE SURGERY Left    TUBAL LIGATION  2000    Social History   Socioeconomic History   Marital status: Divorced    Spouse name: Not on file   Number of children: Not on file   Years of education: Not on file   Highest education level: Not on file  Occupational History   Not on file  Tobacco Use   Smoking status: Former    Types: Cigarettes    Passive exposure: Past    Smokeless tobacco: Never  Vaping Use   Vaping status: Never Used  Substance and Sexual Activity   Alcohol use: Yes    Comment: occ.   Drug use: Not Currently    Types: Marijuana    Comment: occasionally   Sexual activity: Yes    Birth control/protection: Surgical    Comment: tubal  Other Topics Concern   Not on file  Social History Narrative   Not on file   Social Drivers of Health   Tobacco Use: Medium Risk (07/16/2024)   Patient History    Smoking Tobacco Use: Former    Smokeless Tobacco Use: Never    Passive Exposure: Past  Physicist, Medical Strain: Low Risk (02/12/2024)   Overall Financial Resource Strain (CARDIA)    Difficulty of Paying Living Expenses: Not hard at all  Food Insecurity: No Food Insecurity (02/12/2024)   Epic    Worried About Radiation Protection Practitioner of Food in the Last Year: Never true    Ran Out of Food in the Last  Year: Never true  Transportation Needs: No Transportation Needs (02/12/2024)   Epic    Lack of Transportation (Medical): No    Lack of Transportation (Non-Medical): No  Physical Activity: Inactive (02/12/2024)   Exercise Vital Sign    Days of Exercise per Week: 0 days    Minutes of Exercise per Session: 0 min  Stress: Stress Concern Present (02/12/2024)   [employer] of Occupational Health - Occupational Stress Questionnaire    Feeling of Stress: To some extent  Social Connections: Socially Integrated (02/12/2024)   Social Connection and Isolation Panel    Frequency of Communication with Friends and Family: More than three times a week    Frequency of Social Gatherings with Friends and Family: Once a week    Attends Religious Services: More than 4 times per year    Active Member of [employer] or Organizations: Yes    Attends Banker Meetings: Never    Marital Status: Living with partner  Depression (PHQ2-9): Medium Risk (02/12/2024)   Depression (PHQ2-9)    PHQ-2 Score: 8  Alcohol Screen: Low Risk (02/12/2024)   Alcohol Screen    Last Alcohol  Screening Score (AUDIT): 1  Housing: Low Risk (02/12/2024)   Epic    Unable to Pay for Housing in the Last Year: No    Number of Times Moved in the Last Year: 0    Homeless in the Last Year: No  Utilities: Not At Risk (02/12/2024)   Epic    Threatened with loss of utilities: No  Health Literacy: Not on file    Family History  Problem Relation Age of Onset   Asthma Mother    Huntington's disease Father    Huntington's disease Brother    Asthma Daughter    Diabetes Daughter    Asthma Son    Huntington's disease Paternal Uncle    Huntington's disease Paternal Aunt     Outpatient Encounter Medications as of 07/16/2024  Medication Sig   acetaminophen  (TYLENOL ) 325 MG tablet Take 2 tablets (650 mg total) by mouth every 6 (six) hours as needed for mild pain (pain score 1-3) (or Fever >/= 101).   albuterol  (VENTOLIN  HFA) 108 (90 Base) MCG/ACT inhaler Inhale 2 puffs into the lungs every 6 (six) hours as needed for wheezing or shortness of breath.   blood glucose meter kit and supplies KIT Dispense based on patient and insurance preference. Use to check sugar up ro four times daily as directed.   Ferrous Sulfate  (IRON  PO) Take by mouth.   Fluticasone-Umeclidin-Vilant (TRELEGY ELLIPTA ) 100-62.5-25 MCG/ACT AEPB Inhale 1 puff into the lungs daily.   furosemide  (LASIX ) 20 MG tablet Take 1 tablet (20 mg total) by mouth daily.   gabapentin  (NEURONTIN ) 100 MG capsule Take 2 capsules (200 mg total) by mouth 2 (two) times daily.   losartan  (COZAAR ) 50 MG tablet Take 1 tablet (50 mg total) by mouth daily.   meclizine  (ANTIVERT ) 25 MG tablet Take 1 tablet (25 mg total) by mouth 3 (three) times daily as needed for dizziness.   meclizine  (ANTIVERT ) 25 MG tablet Take 1 tablet (25 mg total) by mouth 3 (three) times daily as needed for dizziness.   metoprolol  tartrate (LOPRESSOR ) 25 MG tablet Take 1 tablet (25 mg total) by mouth 2 (two) times daily.   Spacer/Aero-Holding Chambers DEVI Use with Breztri    UNABLE  TO FIND Med for eyes-daily   UNABLE TO FIND Ginsing-3 times a week   VITAMIN D PO Take by mouth.   [  DISCONTINUED] Continuous Glucose Sensor (FREESTYLE LIBRE 3 PLUS SENSOR) MISC Change sensor every 15 days.   [DISCONTINUED] empagliflozin  (JARDIANCE ) 10 MG TABS tablet Take 10 mg by mouth daily.   [DISCONTINUED] insulin  glargine (LANTUS ) 100 unit/mL SOPN Inject 35 Units into the skin at bedtime.   [DISCONTINUED] insulin  lispro (HUMALOG ) 100 UNIT/ML KwikPen Inject 10-16 Units into the skin 3 (three) times daily.   [DISCONTINUED] Insulin  Pen Needle 32G X 4 MM MISC Use up to 5 times daily to administer insulin .   Continuous Glucose Sensor (FREESTYLE LIBRE 3 PLUS SENSOR) MISC Change sensor every 15 days.   empagliflozin  (JARDIANCE ) 10 MG TABS tablet Take 1 tablet (10 mg total) by mouth daily.   insulin  glargine (LANTUS ) 100 unit/mL SOPN Inject 35 Units into the skin at bedtime.   insulin  lispro (HUMALOG ) 100 UNIT/ML KwikPen Inject 10-16 Units into the skin 3 (three) times daily.   Insulin  Pen Needle 32G X 4 MM MISC Use up to 5 times daily to administer insulin .   Semaglutide ,0.25 or 0.5MG /DOS, (OZEMPIC , 0.25 OR 0.5 MG/DOSE,) 2 MG/3ML SOPN Inject 0.25 mg SQ weekly x 2 weeks, then increase to 0.5 mg SQ weekly thereafter.   [DISCONTINUED] Semaglutide ,0.25 or 0.5MG /DOS, (OZEMPIC , 0.25 OR 0.5 MG/DOSE,) 2 MG/3ML SOPN Inject 0.25 mg SQ weekly x 2 weeks, then increase to 0.5 mg SQ weekly thereafter. (Patient not taking: Reported on 07/16/2024)   No facility-administered encounter medications on file as of 07/16/2024.    ALLERGIES: No Known Allergies  VACCINATION STATUS: Immunization History  Administered Date(s) Administered   Influenza, Seasonal, Injecte, Preservative Fre 03/25/2023   Influenza,inj,Quad PF,6+ Mos 04/02/2018   Influenza,trivalent, recombinat, inj, PF 04/14/2017   PPD Test 10/16/2022   Pneumococcal Polysaccharide-23 11/22/2015    Diabetes She presents for her follow-up diabetic visit.  She has type 2 diabetes mellitus. Onset time: diagnosed at approx age of 62. Her disease course has been worsening. There are no hypoglycemic associated symptoms. Associated symptoms include fatigue and foot paresthesias. There are no hypoglycemic complications. Diabetic complications include PVD (hx partial toe amputation both feet). Risk factors for coronary artery disease include diabetes mellitus, dyslipidemia, family history and hypertension. Current diabetic treatment includes intensive insulin  program and oral agent (monotherapy). She is compliant with treatment most of the time (stopped her Ozempic  between visits). Her weight is fluctuating minimally. She is following a generally unhealthy diet. When asked about meal planning, she reported none. She has not had a previous visit with a dietitian. She participates in exercise intermittently. Her home blood glucose trend is increasing steadily. Her breakfast blood glucose range is generally 140-180 mg/dl. Her lunch blood glucose range is generally >200 mg/dl. Her dinner blood glucose range is generally >200 mg/dl. Her bedtime blood glucose range is generally >200 mg/dl. Her overall blood glucose range is >200 mg/dl. (She presents today, with her CGM showing above target glycemic profile overall.    Her POCT A1c today is 10.5%, increasing from last visit of 8.8%.  Analysis of her CGM shows TIR 27%, TBR < 1%, TAR 73% with a GMI of 9.2%.  She stopped her Ozempic  between visits due to GI issues.  She does not eat on any routine pattern and still is consuming more convenience foods.) An ACE inhibitor/angiotensin II receptor blocker is being taken. She does not see a podiatrist.Eye exam is current.     Review of systems  Constitutional: + Minimally fluctuating body weight,  current Body mass index is 34.88 kg/m. , no fatigue, no subjective hyperthermia, no  subjective hypothermia Eyes: no blurry vision, no xerophthalmia ENT: no sore throat, no nodules  palpated in throat, no dysphagia/odynophagia, no hoarseness Cardiovascular: no chest pain, no shortness of breath, no palpitations, no leg swelling Respiratory: no cough, no shortness of breath Gastrointestinal: no nausea/vomiting/diarrhea Musculoskeletal: no muscle/joint aches Skin: no rashes, no hyperemia Neurological: no tremors, no numbness, no tingling, no dizziness Psychiatric: no depression, no anxiety  Objective:     BP 122/62 (BP Location: Right Arm, Patient Position: Sitting, Cuff Size: Large)   Pulse 66   Ht 5' 4 (1.626 m)   Wt 203 lb 3.2 oz (92.2 kg)   BMI 34.88 kg/m   Wt Readings from Last 3 Encounters:  07/16/24 203 lb 3.2 oz (92.2 kg)  04/30/24 211 lb (95.7 kg)  03/29/24 211 lb (95.7 kg)     BP Readings from Last 3 Encounters:  07/16/24 122/62  04/30/24 125/75  03/29/24 125/75      Physical Exam- Limited  Constitutional:  Body mass index is 34.88 kg/m. , not in acute distress, normal state of mind Eyes:  EOMI, no exophthalmos Musculoskeletal: no gross deformities, strength intact in all four extremities, no gross restriction of joint movements Skin:  no rashes, no hyperemia Neurological: no tremor with outstretched hands   Diabetic Foot Exam - Simple   No data filed     CMP ( most recent) CMP     Component Value Date/Time   NA 135 05/15/2024 1634   K 4.5 05/15/2024 1634   CL 95 (L) 05/15/2024 1634   CO2 24 05/15/2024 1634   GLUCOSE 269 (H) 05/15/2024 1634   GLUCOSE 129 (H) 03/31/2024 1521   BUN 28 (H) 05/15/2024 1634   CREATININE 1.75 (H) 05/15/2024 1634   CREATININE 1.68 (H) 03/31/2024 1521   CALCIUM  9.3 05/15/2024 1634   PROT 6.8 05/15/2024 1634   ALBUMIN 3.9 05/15/2024 1634   AST 28 05/15/2024 1634   ALT 20 05/15/2024 1634   ALKPHOS 195 (H) 05/15/2024 1634   BILITOT 0.3 05/15/2024 1634   EGFR 35 (L) 05/15/2024 1634   GFRNONAA >60 07/05/2023 0454     Diabetic Labs (most recent): Lab Results  Component Value Date   HGBA1C 10.5  (A) 07/16/2024   HGBA1C 8.8 (A) 03/11/2024   HGBA1C 8.5 (A) 10/31/2023   MICROALBUR 17.2 10/04/2022     Lipid Panel ( most recent) Lipid Panel     Component Value Date/Time   CHOL 234 (H) 10/10/2023 1534   TRIG 43 10/10/2023 1534   HDL 110 10/10/2023 1534   CHOLHDL 2.1 10/10/2023 1534   CHOLHDL 2.4 10/04/2022 1530   LDLCALC 117 (H) 10/10/2023 1534   LDLCALC 108 (H) 10/04/2022 1530   LABVLDL 7 10/10/2023 1534      No results found for: TSH, FREET4         Assessment & Plan:   1) Type 2 diabetes mellitus with hypoglycemia without coma, with long-term current use of insulin  (HCC)  She presents today, with her CGM showing above target glycemic profile overall.    Her POCT A1c today is 10.5%, increasing from last visit of 8.8%.  Analysis of her CGM shows TIR 27%, TBR < 1%, TAR 73% with a GMI of 9.2%.  She stopped her Ozempic  between visits due to GI issues.  She does not eat on any routine pattern and still is consuming more convenience foods.  - Jennye Runquist has currently uncontrolled symptomatic type 2 DM since 49 years of age.   -Recent  labs reviewed.  - I had a long discussion with her about the progressive nature of diabetes and the pathology behind its complications. -her diabetes is complicated by neuropathy, PVD- partial amputation of toe on bilateral feet and she remains at a high risk for more acute and chronic complications which include CAD, CVA, CKD, retinopathy, and neuropathy. These are all discussed in detail with her.  The following Lifestyle Medicine recommendations according to American College of Lifestyle Medicine [facility]) were discussed and offered to patient and she agrees to start the journey:  A. Whole Foods, Plant-based plate comprising of fruits and vegetables, plant-based proteins, whole-grain carbohydrates was discussed in detail with the patient.   A list for source of those nutrients were also provided to the patient.  Patient will use only  water or unsweetened tea for hydration. B.  The need to stay away from risky substances including alcohol, smoking; obtaining 7 to 9 hours of restorative sleep, at least 150 minutes of moderate intensity exercise weekly, the importance of healthy social connections,  and stress reduction techniques were discussed. C.  A full color page of  Calorie density of various food groups per pound showing examples of each food groups was provided to the patient.  - Nutritional counseling repeated/built upon at each appointment.  - The patient admits there is a room for improvement in their diet and drink choices. -  Suggestion is made for the patient to avoid simple carbohydrates from their diet including Cakes, Sweet Desserts / Pastries, Ice Cream, Soda (diet and regular), Sweet Tea, Candies, Chips, Cookies, Sweet Pastries, Store Bought Juices, Alcohol in Excess of 1-2 drinks a day, Artificial Sweeteners, Coffee Creamer, and Sugar-free Products. This will help patient to have stable blood glucose profile and potentially avoid unintended weight gain.   - I encouraged the patient to switch to unprocessed or minimally processed complex starch and increased protein intake (animal or plant source), fruits, and vegetables.   - Patient is advised to stick to a routine mealtimes to eat 3 meals a day and avoid unnecessary snacks (to snack only to correct hypoglycemia).  - I have approached her with the following individualized plan to manage her diabetes and patient agrees:   -She is advised to continue Lantus  35 units SQ nightly and continue Novolog  10-16 units TID with meals if glucose is above 90 and she is eating (Specific instructions on how to titrate insulin  dosage based on glucose readings given to patient in writing). She can continue Jardiance  10 mg po daily by nephrology.  We did go over potential side effects of this medication to watch for.  I did encourage her to restart Ozempic  0.25 mg sQ weekly x 4  weeks, then increase to 0.5 mg SQ weekly thereafter if tolerated well.  This may help her liver and kidneys as well as help control her diabetes.  We talked about strategies to avoid GI upset with this class of medications.  Her liver specialist wants her to continue this medication also.  -she is encouraged to continue monitoring glucose 4 times daily (using her CGM), before meals and before bed, and to call the clinic if she has readings less than 70 or above 300 for 3 tests in a row.   - she is warned not to take insulin  without proper monitoring per orders. - Adjustment parameters are given to her for hypo and hyperglycemia in writing.  - Specific targets for  A1c; LDL, HDL, and Triglycerides were discussed with the patient.  2) Blood Pressure /Hypertension:  her blood pressure is not controlled to target today.   she is advised to continue her current medications as prescribed by PCP.  3) Lipids/Hyperlipidemia:    Review of her recent lipid panel from 10/04/22 showed uncontrolled LDL at 108 .  she is advised to continue Crestor  20 mg daily at bedtime.  Side effects and precautions discussed with her.  She has annual exam with labs coming up in April.  Will defer labs to him.  4)  Weight/Diet:  her Body mass index is 34.88 kg/m.  -  she is NOT a candidate for major weight loss. I discussed with her the fact that loss of 5 - 10% of her  current body weight will have the most impact on her diabetes management.  Exercise, and detailed carbohydrates information provided  -  detailed on discharge instructions.  5) Chronic Care/Health Maintenance: -she is on ACEI/ARB and Statin medications and is encouraged to initiate and continue to follow up with Ophthalmology, Dentist, Podiatrist at least yearly or according to recommendations, and advised to stay away from smoking. I have recommended yearly flu vaccine and pneumonia vaccine at least every 5 years; moderate intensity exercise for up to 150  minutes weekly; and sleep for at least 7 hours a day.  - she is advised to maintain close follow up with Cook, Jayce G, DO for primary care needs, as well as her other providers for optimal and coordinated care.     I spent  46  minutes in the care of the patient today including review of labs from CMP, Lipids, Thyroid Function, Hematology (current and previous including abstractions from other facilities); face-to-face time discussing  her blood glucose readings/logs, discussing hypoglycemia and hyperglycemia episodes and symptoms, medications doses, her options of short and long term treatment based on the latest standards of care / guidelines;  discussion about incorporating lifestyle medicine;  and documenting the encounter. Risk reduction counseling performed per USPSTF guidelines to reduce obesity and cardiovascular risk factors.     Please refer to Patient Instructions for Blood Glucose Monitoring and Insulin /Medications Dosing Guide  in media tab for additional information. Please  also refer to  Patient Self Inventory in the Media  tab for reviewed elements of pertinent patient history.  Charlyne Pesa participated in the discussions, expressed understanding, and voiced agreement with the above plans.  All questions were answered to her satisfaction. she is encouraged to contact clinic should she have any questions or concerns prior to her return visit.    Follow up plan: - Return in about 4 months (around 11/13/2024) for Diabetes F/U with A1c in office, No previsit labs, Bring meter and logs.  Benton Rio, [facility] [address] [place], [place] [address] Phone: [phone] Fax: [phone]  07/16/2024, 4:32 PM    "

## 2024-08-03 ENCOUNTER — Ambulatory Visit: Admitting: Internal Medicine

## 2024-09-24 ENCOUNTER — Ambulatory Visit: Admitting: Family Medicine

## 2024-11-17 ENCOUNTER — Ambulatory Visit: Admitting: Nurse Practitioner
